# Patient Record
Sex: Male | Born: 1950 | Race: White | Hispanic: No | Marital: Married | Smoking: Former smoker
Health system: Southern US, Community
[De-identification: ages and names within clinical notes are randomized; demographics above are authoritative.]

## PROBLEM LIST (undated history)

## (undated) DIAGNOSIS — S065XAA Traumatic subdural hemorrhage with loss of consciousness status unknown, initial encounter: Secondary | ICD-10-CM

## (undated) DIAGNOSIS — F32A Depression, unspecified: Secondary | ICD-10-CM

## (undated) DIAGNOSIS — I10 Essential (primary) hypertension: Secondary | ICD-10-CM

## (undated) DIAGNOSIS — C649 Malignant neoplasm of unspecified kidney, except renal pelvis: Secondary | ICD-10-CM

## (undated) DIAGNOSIS — F329 Major depressive disorder, single episode, unspecified: Secondary | ICD-10-CM

## (undated) DIAGNOSIS — F419 Anxiety disorder, unspecified: Secondary | ICD-10-CM

## (undated) DIAGNOSIS — E785 Hyperlipidemia, unspecified: Secondary | ICD-10-CM

## (undated) DIAGNOSIS — C61 Malignant neoplasm of prostate: Secondary | ICD-10-CM

## (undated) DIAGNOSIS — J439 Emphysema, unspecified: Secondary | ICD-10-CM

## (undated) DIAGNOSIS — M2550 Pain in unspecified joint: Secondary | ICD-10-CM

## (undated) HISTORY — DX: Pain in unspecified joint: M25.50

## (undated) HISTORY — DX: Emphysema, unspecified: J43.9

## (undated) HISTORY — DX: Essential (primary) hypertension: I10

## (undated) HISTORY — DX: Major depressive disorder, single episode, unspecified: F32.9

## (undated) HISTORY — DX: Depression, unspecified: F32.A

## (undated) HISTORY — DX: Hyperlipidemia, unspecified: E78.5

## (undated) HISTORY — DX: Anxiety disorder, unspecified: F41.9

## (undated) HISTORY — DX: Malignant neoplasm of unspecified kidney, except renal pelvis: C64.9

## (undated) HISTORY — DX: Malignant neoplasm of prostate: C61

---

## 1994-11-28 DIAGNOSIS — J432 Centrilobular emphysema: Secondary | ICD-10-CM | POA: Insufficient documentation

## 1994-11-28 DIAGNOSIS — J439 Emphysema, unspecified: Secondary | ICD-10-CM

## 1994-11-28 HISTORY — DX: Emphysema, unspecified: J43.9

## 2006-11-28 DIAGNOSIS — C61 Malignant neoplasm of prostate: Secondary | ICD-10-CM

## 2006-11-28 HISTORY — DX: Malignant neoplasm of prostate: C61

## 2006-12-29 ENCOUNTER — Ambulatory Visit: Payer: Self-pay | Admitting: Urology

## 2007-01-30 ENCOUNTER — Other Ambulatory Visit: Payer: Self-pay

## 2007-01-30 ENCOUNTER — Ambulatory Visit: Payer: Self-pay | Admitting: Urology

## 2007-02-13 ENCOUNTER — Ambulatory Visit: Payer: Self-pay | Admitting: Urology

## 2008-06-16 ENCOUNTER — Ambulatory Visit: Payer: Self-pay | Admitting: Urology

## 2008-06-19 ENCOUNTER — Other Ambulatory Visit: Payer: Self-pay

## 2008-06-19 ENCOUNTER — Ambulatory Visit: Payer: Self-pay | Admitting: Urology

## 2008-06-24 ENCOUNTER — Ambulatory Visit: Payer: Self-pay | Admitting: Urology

## 2008-07-09 ENCOUNTER — Ambulatory Visit: Payer: Self-pay | Admitting: Urology

## 2008-11-24 ENCOUNTER — Ambulatory Visit: Payer: Self-pay | Admitting: Urology

## 2008-11-28 DIAGNOSIS — C649 Malignant neoplasm of unspecified kidney, except renal pelvis: Secondary | ICD-10-CM

## 2008-11-28 HISTORY — DX: Malignant neoplasm of unspecified kidney, except renal pelvis: C64.9

## 2008-12-09 ENCOUNTER — Ambulatory Visit: Payer: Self-pay | Admitting: Urology

## 2009-06-02 ENCOUNTER — Ambulatory Visit: Payer: Self-pay | Admitting: Urology

## 2010-08-27 ENCOUNTER — Ambulatory Visit: Payer: Self-pay | Admitting: Urology

## 2010-09-07 ENCOUNTER — Ambulatory Visit: Payer: Self-pay | Admitting: Urology

## 2010-09-09 LAB — PATHOLOGY REPORT

## 2010-09-16 ENCOUNTER — Ambulatory Visit: Payer: Self-pay | Admitting: Urology

## 2010-10-28 ENCOUNTER — Ambulatory Visit: Payer: Self-pay | Admitting: Urology

## 2010-11-04 ENCOUNTER — Ambulatory Visit: Payer: Self-pay | Admitting: Urology

## 2010-11-11 ENCOUNTER — Ambulatory Visit: Payer: Self-pay | Admitting: Urology

## 2010-11-18 ENCOUNTER — Ambulatory Visit: Payer: Self-pay | Admitting: Urology

## 2010-12-03 ENCOUNTER — Ambulatory Visit: Payer: Self-pay | Admitting: Urology

## 2010-12-10 ENCOUNTER — Ambulatory Visit: Payer: Self-pay | Admitting: Urology

## 2012-05-22 ENCOUNTER — Ambulatory Visit: Payer: Self-pay | Admitting: Internal Medicine

## 2012-05-28 ENCOUNTER — Ambulatory Visit: Payer: Self-pay | Admitting: Internal Medicine

## 2012-06-28 ENCOUNTER — Ambulatory Visit: Payer: Self-pay | Admitting: Internal Medicine

## 2012-07-29 ENCOUNTER — Ambulatory Visit: Payer: Self-pay | Admitting: Internal Medicine

## 2012-12-12 DIAGNOSIS — Z8551 Personal history of malignant neoplasm of bladder: Secondary | ICD-10-CM | POA: Insufficient documentation

## 2012-12-12 DIAGNOSIS — Z8042 Family history of malignant neoplasm of prostate: Secondary | ICD-10-CM | POA: Insufficient documentation

## 2012-12-12 DIAGNOSIS — N390 Urinary tract infection, site not specified: Secondary | ICD-10-CM | POA: Insufficient documentation

## 2012-12-12 DIAGNOSIS — Z8546 Personal history of malignant neoplasm of prostate: Secondary | ICD-10-CM | POA: Insufficient documentation

## 2012-12-12 DIAGNOSIS — C671 Malignant neoplasm of dome of bladder: Secondary | ICD-10-CM | POA: Insufficient documentation

## 2012-12-12 DIAGNOSIS — R300 Dysuria: Secondary | ICD-10-CM | POA: Insufficient documentation

## 2012-12-12 DIAGNOSIS — R339 Retention of urine, unspecified: Secondary | ICD-10-CM | POA: Insufficient documentation

## 2012-12-12 DIAGNOSIS — C61 Malignant neoplasm of prostate: Secondary | ICD-10-CM | POA: Insufficient documentation

## 2012-12-12 DIAGNOSIS — R350 Frequency of micturition: Secondary | ICD-10-CM | POA: Insufficient documentation

## 2012-12-12 DIAGNOSIS — N529 Male erectile dysfunction, unspecified: Secondary | ICD-10-CM | POA: Insufficient documentation

## 2012-12-12 DIAGNOSIS — D099 Carcinoma in situ, unspecified: Secondary | ICD-10-CM | POA: Insufficient documentation

## 2012-12-12 DIAGNOSIS — R310 Gross hematuria: Secondary | ICD-10-CM | POA: Insufficient documentation

## 2012-12-19 DIAGNOSIS — Z8546 Personal history of malignant neoplasm of prostate: Secondary | ICD-10-CM | POA: Insufficient documentation

## 2013-06-19 DIAGNOSIS — N139 Obstructive and reflux uropathy, unspecified: Secondary | ICD-10-CM | POA: Insufficient documentation

## 2014-11-28 HISTORY — PX: DIGIT NAIL REMOVAL: SHX5052

## 2015-02-16 ENCOUNTER — Other Ambulatory Visit: Payer: Self-pay

## 2015-02-16 VITALS — BP 112/58 | Ht 70.5 in | Wt 225.4 lb

## 2015-02-16 DIAGNOSIS — R739 Hyperglycemia, unspecified: Secondary | ICD-10-CM | POA: Insufficient documentation

## 2015-02-16 NOTE — Assessment & Plan Note (Signed)
Schedule an appointment with foot doctor within the next two weeks

## 2015-02-16 NOTE — Patient Instructions (Signed)
  Problem- discharge from under the toenail Goal- prevent long term complications by scheduling an appointment with foot doctor within the next two weeks.    Please complete the EMMI education I have sent you by e-mail

## 2015-02-16 NOTE — Patient Outreach (Signed)
Henry Horne came to me today feeling very unwell.  He complained of cold symptoms and tells me he had a fever last week.  He saw Dr. Luan Pulling last Thursday, March 17th, 2016 but was prescribed no medications. He has put a call in this am for Dr. Luan Pulling because he continues to feel badly.    He tells me he developed shingles about 2 weeks ago and was put on Acyclovir- he has completed the prescription and the pain has subsided. Blood sugars have been remarkably higher since 01/29/15.    Henry Horne was tracking his food intake through the Henry Horne when he was feeling better but has not been doing Horne lately- his diet reveals highly sweetened/ high fat choices- Poptarts, biscuits, Henry Horne.   Henry Horne inquired about a foot doctor and tells me he wants some toenails removed.  He also reveals that he pressed on one nail on the left foot and some discharge was released- he denies exudate from the nails on a daily basis and says there are no open areas, no flaking, no discoloration.

## 2015-02-18 ENCOUNTER — Other Ambulatory Visit: Payer: Self-pay

## 2015-02-18 NOTE — Patient Outreach (Signed)
   02/18/2015   Henry Horne 1951-02-12 [id number]  I called the patient to check on him as a follow up to our visit on Monday, February 16, 2015.  He tells me the MD ordered him a Z-pak and Guifenesin-codeine for his cough.  The patient has not called the foot doctor yet and has not noticed any further discharge.  I will follow up with a phone call in a few weeks.     Gentry Fitz, RN, BA, [place], [place] Direct Dial:  [phone]  Fax:  [phone] E-mail: [email or web address] [address], [place], [place]  [address]

## 2015-03-10 ENCOUNTER — Other Ambulatory Visit: Payer: Self-pay

## 2015-03-10 NOTE — Patient Outreach (Signed)
Spoke with Henry Horne today by phone as he left me a message regarding seeing the podiatrist last week.  By the time he had seen the podiatrist, there was no more drainage coming from below his toenail- he had been soaking it and the drainiage had subsided.  MD assessed his toes and are planning to remove 2  nails on Thursday, March 13, 2015. Henry Horne is not having any pain and was at work when I spoke to him and was not started on antibiotics.   Gentry Fitz, RN, BA, MHA, CDE Diabetes Coordinator Inpatient Diabetes Program  [phone] (Team Pager) [phone] [name]) 03/10/2015 1:10 PM

## 2015-03-24 ENCOUNTER — Other Ambulatory Visit: Payer: Self-pay

## 2015-06-16 ENCOUNTER — Other Ambulatory Visit: Payer: Self-pay

## 2015-06-16 VITALS — BP 118/70 | Ht 70.5 in | Wt 226.2 lb

## 2015-06-16 DIAGNOSIS — R739 Hyperglycemia, unspecified: Secondary | ICD-10-CM

## 2015-06-16 NOTE — Patient Outreach (Signed)
[place] [facility]) Care Management  [place]  06/16/2015   Henry Horne 1951-04-26 [id number]  Subjective: Patient comes to me for a regular scheduled visit.  He has been to the foot doctor a number of times since our last visit and had toenails from both feet removed after he noticed discharge from under some of them.   The toes look healthy, there is currently no drainage-all are dry.  His left big toe sporadically gives him pain.  It is a little more red than the other toes but has no open areas- it was the most recent toe examined and surgically manipulated.  He is checking his feet daily.  His blood sugar were somewhat more elevated during that time but are generally well controlled now.    He is having a great deal of financial stress related to his children and grandchildren.    He had shingles in the early part of this year- he complains of still having some marks across his back and is looking forward to getting the Shingles vaccine.   He saw Dr. Jacqlyn Larsen in May at which time he had a cystoscope- he will see him again in 6 months.   Objective:   Filed Vitals:   06/16/15 1438  BP: 118/70  Weight 226.2    Current Medications:  Current Outpatient Prescriptions  Medication Sig Dispense Refill  . albuterol (PROAIR HFA) 108 (90 BASE) MCG/ACT inhaler as needed.    Marland Kitchen amLODipine (NORVASC) 2.5 MG tablet     . benazepril (LOTENSIN) 40 MG tablet Take 40 mg by mouth.    . esomeprazole (NEXIUM) 40 MG capsule Take 40 mg by mouth every other day.    Marland Kitchen FLUoxetine (PROZAC) 20 MG capsule every other day.    . Fluticasone-Salmeterol (ADVAIR DISKUS) 250-50 MCG/DOSE AEPB as needed.    . Garlic Oil 2 MG CAPS Take 1,000 mg by mouth 2 times daily at 12 noon and 4 pm.    . HYDROcodone-acetaminophen (NORCO/VICODIN) 5-325 MG per tablet as needed.    Marland Kitchen ibuprofen (ADVIL,MOTRIN) 800 MG tablet 1 tablet as needed.    Marland Kitchen imipramine (TOFRANIL) 25 MG tablet Take 25 mg by mouth daily.     . insulin glargine (LANTUS) 100 UNIT/ML injection 56 Units every evening.    . Insulin Pen Needle (NOVOFINE) 30G X 8 MM MISC     . Insulin Pen Needle (UNIFINE PENTIPS) 31G X 5 MM MISC     . Liraglutide (VICTOZA) 18 MG/3ML SOPN Frequency:PHARMDIR   Dosage:0.0     Instructions:  Note:1.49m SQ daily Dose: 0.6MG/0.1ML    . metFORMIN (GLUCOPHAGE) 1000 MG tablet Take 1,000 mg by mouth.    .Marland KitchenamLODipine (NORVASC) 2.5 MG tablet Take 2.5 mg by mouth daily.    . benazepril (LOTENSIN) 40 MG tablet Take 40 mg by mouth daily. 1 tablet once a day    . esomeprazole (NEXIUM) 40 MG capsule Take 40 mg by mouth daily at 12 noon. 1 tablet once a day    . FLUoxetine (PROZAC) 20 MG tablet Take 20 mg by mouth daily.    . Fluticasone-Salmeterol (ADVAIR) 250-50 MCG/DOSE AEPB Inhale 1 puff into the lungs once. Once a day    . imipramine (TOFRANIL) 25 MG tablet Take 25 mg by mouth at bedtime.    . insulin glargine (LANTUS) 100 UNIT/ML injection Inject 56 Units into the skin daily. Solostar pen    . metFORMIN (GLUCOPHAGE) 1000 MG tablet Take 1,000 mg by  mouth 2 (two) times daily.    . vitamin C (ASCORBIC ACID) 500 MG tablet Take 1,000 mg by mouth daily.     No current facility-administered medications for this visit.    Functional Status:  In your present state of health, do you have any difficulty performing the following activities: 06/16/2015  Hearing? N  Vision? N  Difficulty concentrating or making decisions? N  Walking or climbing stairs? N  Dressing or bathing? N  Doing errands, shopping? N    Fall/Depression Screening: PHQ 2/9 Scores 06/16/2015  PHQ - 2 Score 2  PHQ- 9 Score 8    Assessment: Appears to be managing his blood sugars.  No complaints. Taking his meds as ordered.  Tends to skip meals and eat foods that are easy to chose. Still wants to lose a little more weight.   Plan:  [place] Problem One        Patient Outreach from 06/16/2015 in [place] Problem  One  patient is having discharge from under his toe nail on his foot   Care Plan for Problem One  Not Active   [facility] Long Term Goal (31-90 days)  Patient will contact the foot doctor to schedule an appointment by March 02, 2015 (two weeks)   [facility] Long Term Goal Start Date  02/16/15   [facility] Long Term Goal Met Date  06/16/15   Interventions for Problem One Long Term Goal  call foot doctor at [place] Problem Two        Patient Outreach from 06/16/2015 in [place] Problem Two  A1C currently 8.0%   Care Plan for Problem Two  Active   Interventions for Problem Two Long Term Goal   Limit sweets to 2x/week, limit fried food to once a day, eat 3 times a day and combine protein and carb   [facility] Long Term Goal (31-90) days  A1C less than 8.%   [facility] Long Term Goal Start Date  06/16/15      Follow up with patient in 3 months.   Gentry Fitz, RN, BA, [place], [place] Direct Dial:  [phone]  Fax:  [phone] E-mail: [email or web address] [address] [place], [place]  [address]

## 2015-07-29 ENCOUNTER — Encounter: Payer: Self-pay | Admitting: Family Medicine

## 2015-07-29 ENCOUNTER — Ambulatory Visit (INDEPENDENT_AMBULATORY_CARE_PROVIDER_SITE_OTHER): Payer: 59 | Admitting: Family Medicine

## 2015-07-29 VITALS — BP 117/73 | HR 78 | Temp 98.7°F | Resp 16 | Ht 70.5 in | Wt 230.2 lb

## 2015-07-29 DIAGNOSIS — E1142 Type 2 diabetes mellitus with diabetic polyneuropathy: Secondary | ICD-10-CM | POA: Diagnosis not present

## 2015-07-29 DIAGNOSIS — D090 Carcinoma in situ of bladder: Secondary | ICD-10-CM

## 2015-07-29 DIAGNOSIS — I10 Essential (primary) hypertension: Secondary | ICD-10-CM | POA: Insufficient documentation

## 2015-07-29 LAB — POCT GLYCOSYLATED HEMOGLOBIN (HGB A1C): Hemoglobin A1C: 7.69

## 2015-07-29 MED ORDER — INSULIN GLARGINE 100 UNIT/ML ~~LOC~~ SOLN: 62.0000 [IU] | Freq: Every day | SUBCUTANEOUS | Status: DC

## 2015-07-29 NOTE — Progress Notes (Signed)
Name: Henry Horne   MRN: [id number]    DOB: 1951/05/14   Date:07/29/2015       Progress Note  Subjective  Chief Complaint  Chief Complaint  Patient presents with  . Diabetes    A1C 8.0  04/23/2015 BS checked once every three weeks  218 this AM    HPI  For f/u of DM and HBP.  Has bladder cancer and is followed by Dr. Jacqlyn Horne.  He does not check sugars as he should. (About once every 2-3 weeks).  Has not kept Neurology f/u re: neuropathy of legs/feet.  No problem-specific assessment & plan notes found for this encounter.   Past Medical History  Diagnosis Date  . Anxiety   . Depression   . Hypertension   . Hyperlipidemia   . Joint pain   . Emphysema of lung 1996  . Prostate cancer 2008    removed with cryotherapy  . Cancer of kidney 2010    cystoscopy 2010, 2014, 2015, 2016- MD q 6 months    Social History  Substance Use Topics  . Smoking status: Former Smoker -- 30 years    Quit date: 05/18/2014  . Smokeless tobacco: Never Used  . Alcohol Use: No     Current outpatient prescriptions:  .  albuterol (PROAIR HFA) 108 (90 BASE) MCG/ACT inhaler, as needed., Disp: , Rfl:  .  amLODipine (NORVASC) 2.5 MG tablet, , Disp: , Rfl:  .  benazepril (LOTENSIN) 40 MG tablet, Take 40 mg by mouth daily. 1 tablet once a day, Disp: , Rfl:  .  esomeprazole (NEXIUM) 40 MG capsule, Take 40 mg by mouth every other day., Disp: , Rfl:  .  FLUoxetine (PROZAC) 20 MG tablet, Take 20 mg by mouth daily., Disp: , Rfl:  .  Fluticasone-Salmeterol (ADVAIR DISKUS) 250-50 MCG/DOSE AEPB, as needed., Disp: , Rfl:  .  Garlic Oil 2 MG CAPS, Take 1,000 mg by mouth 2 times daily at 12 noon and 4 pm., Disp: , Rfl:  .  HYDROcodone-acetaminophen (NORCO/VICODIN) 5-325 MG per tablet, as needed., Disp: , Rfl:  .  ibuprofen (ADVIL,MOTRIN) 800 MG tablet, 1 tablet as needed., Disp: , Rfl:  .  imipramine (TOFRANIL) 25 MG tablet, Take 25 mg by mouth daily., Disp: , Rfl:  .  insulin glargine (LANTUS) 100 UNIT/ML  injection, 56 Units every evening., Disp: , Rfl:  .  Insulin Pen Needle (NOVOFINE) 30G X 8 MM MISC, , Disp: , Rfl:  .  Insulin Pen Needle (UNIFINE PENTIPS) 31G X 5 MM MISC, , Disp: , Rfl:  .  Liraglutide (VICTOZA) 18 MG/3ML SOPN, Frequency:PHARMDIR   Dosage:0.0     Instructions:  Note:1.3mL SQ daily Dose: 0.6MG /0.1ML, Disp: , Rfl:  .  metFORMIN (GLUCOPHAGE) 1000 MG tablet, Take 1,000 mg by mouth., Disp: , Rfl:   Allergies  Allergen Reactions  . Pneumococcal Vaccine   . Pneumococcal Vaccines     Review of Systems  Constitutional: Negative for fever, chills, weight loss and malaise/fatigue.  HENT: Negative for hearing loss.   Eyes: Negative for blurred vision and double vision.  Respiratory: Negative for cough, sputum production, shortness of breath and wheezing.   Cardiovascular: Negative for chest pain, palpitations, orthopnea and leg swelling.  Gastrointestinal: Negative for heartburn, nausea, vomiting, abdominal pain and diarrhea.  Genitourinary: Negative for dysuria and frequency.  Neurological: Positive for tingling. Negative for weakness and headaches.       Numbness of both plantar surfaes of feet.      Objective  Filed Vitals:   07/29/15 0922  BP: 117/73  Pulse: 78  Temp: 98.7 F (37.1 C)  Resp: 16  Height: 5' 10.5" (1.791 m)  Weight: 230 lb 3.2 oz (104.418 kg)     Physical Exam  Constitutional: He is well-developed, well-nourished, and in no distress. No distress.  HENT:  Head: Normocephalic and atraumatic.  Eyes: Conjunctivae and EOM are normal. Pupils are equal, round, and reactive to light. No scleral icterus.  Neck: Normal range of motion. Neck supple. Carotid bruit is not present. No thyromegaly present.  Cardiovascular: Normal rate, regular rhythm, normal heart sounds and intact distal pulses.  Exam reveals no gallop and no friction rub.   No murmur heard. Pulmonary/Chest: Effort normal and breath sounds normal. No respiratory distress. He has no wheezes.  He has no rales.  Abdominal: Soft. Bowel sounds are normal. He exhibits no distension and no mass. There is no tenderness.  Musculoskeletal: He exhibits no edema.  Lymphadenopathy:    He has no cervical adenopathy.  Neurological:  Some decreased sensation on plantar surface of both feet.  Vitals reviewed.     Recent Results (from the past 2160 hour(s))  POCT HgB A1C     Status: Abnormal   Collection Time: 07/29/15  9:42 AM  Result Value Ref Range   Hemoglobin A1C 7.69      Assessment & Plan  1. Type 2 diabetes mellitus with diabetic polyneuropathy  - POCT HgB A1C - 7.9 - insulin glargine (LANTUS) 100 UNIT/ML injection; Inject 0.62 mLs (62 Units total) into the skin at bedtime.  Dispense: 10 mL; Refill: 12  2. Bladder CA in situ   3. Essential hypertension

## 2015-07-29 NOTE — Patient Outreach (Signed)
[facility]) Care Management  07/29/2015  Henry Horne September 27, 1951 [id number]   Raden stopped by to tell me his A1C is 7.9% and that his MD had increased his Lantus insulin from 56units/day to 62 units per day.   Gentry Fitz, RN, BA, [place], [place] Direct Dial:  [phone]  Fax:  [phone] E-mail: [email or web address] [address], [place], [place]  [address]

## 2015-07-29 NOTE — Patient Instructions (Signed)
Encouraged to get yearly eye exam at [facility] soon.  Check blood sugars at least once a day.

## 2015-09-14 ENCOUNTER — Ambulatory Visit: Payer: 59

## 2015-09-16 ENCOUNTER — Other Ambulatory Visit: Payer: Self-pay

## 2015-09-16 ENCOUNTER — Other Ambulatory Visit: Payer: Self-pay | Admitting: Family Medicine

## 2015-09-16 VITALS — BP 126/64 | Ht 70.5 in | Wt 229.9 lb

## 2015-09-16 DIAGNOSIS — E1165 Type 2 diabetes mellitus with hyperglycemia: Secondary | ICD-10-CM

## 2015-09-16 DIAGNOSIS — Z794 Long term (current) use of insulin: Secondary | ICD-10-CM

## 2015-09-16 DIAGNOSIS — R739 Hyperglycemia, unspecified: Secondary | ICD-10-CM

## 2015-09-16 NOTE — Patient Outreach (Signed)
[facility] [facility]) Care Management  [place]  09/16/2015   Henry Horne 18-Oct-1951 [id number]  Subjective: Patient comes to me for his Link to Wellness visit. He complains of elevated blood sugars 90-240m/dl.  He also reveals he's having more financial difficulties and has decided to decrease his Prozac from once a day to once every two days because he was having "terrible dreams".   Objective:  Filed Vitals:   09/16/15 1053  BP: 126/64  Height: 1.791 m (5' 10.5")  Weight: 229 lb 14.4 oz (104.282 kg)     Current Medications:  Current Outpatient Prescriptions  Medication Sig Dispense Refill  . albuterol (PROAIR HFA) 108 (90 BASE) MCG/ACT inhaler as needed.    .Marland KitchenamLODipine (NORVASC) 2.5 MG tablet     . benazepril (LOTENSIN) 40 MG tablet Take 40 mg by mouth daily. 1 tablet once a day    . esomeprazole (NEXIUM) 40 MG capsule Take 40 mg by mouth every other day.    .Marland KitchenFLUoxetine (PROZAC) 20 MG tablet Take 20 mg by mouth every other day.     . Fluticasone-Salmeterol (ADVAIR DISKUS) 250-50 MCG/DOSE AEPB as needed.    . Garlic Oil 2 MG CAPS Take 1,000 mg by mouth daily.     .Marland KitchenHYDROcodone-acetaminophen (NORCO/VICODIN) 5-325 MG per tablet as needed.    .Marland Kitchenibuprofen (ADVIL,MOTRIN) 800 MG tablet 1 tablet as needed.    .Marland Kitchenimipramine (TOFRANIL) 25 MG tablet Take 25 mg by mouth daily.    . insulin glargine (LANTUS) 100 UNIT/ML injection Inject 0.62 mLs (62 Units total) into the skin at bedtime. 10 mL 12  . Insulin Pen Needle (NOVOFINE) 30G X 8 MM MISC     . Insulin Pen Needle (UNIFINE PENTIPS) 31G X 5 MM MISC     . Liraglutide (VICTOZA) 18 MG/3ML SOPN Frequency:PHARMDIR   Dosage:0.0     Instructions:  Note:1.863mSQ daily Dose: 0.6MG/0.1ML    . metFORMIN (GLUCOPHAGE) 1000 MG tablet Take 1,000 mg by mouth 2 (two) times daily with a meal.     . TRUEPLUS LANCETS 30G MISC USE AS DIRECTED 3 TIMES A DAY 300 each 1  . TRUETEST TEST test strip USE AS DIRECTED 3 TIMES A DAY 300  each 1   No current facility-administered medications for this visit.    Functional Status:  In your present state of health, do you have any difficulty performing the following activities: 09/16/2015 07/29/2015  Hearing? N N  Vision? N N  Difficulty concentrating or making decisions? N N  Walking or climbing stairs? N N  Dressing or bathing? N N  Doing errands, shopping? N N    Fall/Depression Screening: PHQ 2/9 Scores 09/16/2015 07/29/2015 06/16/2015  PHQ - 2 Score 4 0 2  PHQ- 9 Score 7 - 8    Assessment: Poorly controlled and poorly engaged patient.  He has some elevated blood sugars but he's only been checking the past week or so just before coming to this appointment.  He has not changed how he's eating- makes poor choices and goes long periods without eating.  Drinking diet drinks only but is eating sweets.   Plan: The only real change Dewarren has made in his recent care, is his decrease in Prozac that he made.  He was having such terrible dreams that he chose to decrease the Prozac to every other day. Historically, when Shawn was not on his depression medication, his blood sugars were worse.  I have suggested he increase  his Prozac to taking 2 days in a row, skipping one day and that I would share this information with you. He may need to be reassessed for a more appropriate anti-depression medication.   [facility] CM Care Plan Problem One        Most Recent Value   Role Documenting the Problem One  Care Management Coordinator   Care Plan for Problem One  Not Active   [facility] Long Term Goal (31-90 days)  Patient will contact the foot doctor to schedule an appointment by March 02, 2015 (two weeks)   [facility] Long Term Goal Start Date  02/16/15   [facility] Long Term Goal Met Date  06/16/15   Interventions for Problem One Long Term Goal  call foot doctor at [phone]    [facility] CM Care Plan Problem Two        Most Recent Value   Care Plan Problem Two  A1C currently 8.0%   Role Documenting the Problem Two   Care Management Coordinator   Care Plan for Problem Two  Active   Interventions for Problem Two Long Term Goal   Limit sweets to 2x/week, limit fried food to once a day, eat 3 times a day and combine protein and carb   [facility] Long Term Goal (31-90) days  A1C less than 8.%   [facility] Long Term Goal Start Date  06/16/15   [facility] Long Term Goal Met Date  09/16/15    [facility] CM Care Plan Problem Three        Most Recent Value   Care Plan Problem Three  Patient is having high blood sugars   Role Documenting the Problem Three  Care Management Coordinator   Care Plan for Problem Three  Active   [facility] Long Term Goal (31-90) days  Patient will lower average blood sugars next time I meet with him in December, 2016   [place] Term Goal Start Date  09/16/15   Interventions for Problem Three Long Term Goal  Check blood sugar once a day, increase Prozac- try take it for 2 days and then off one day     Follow up with me on 11/17/15 and MD on 11/02/15   Gentry Fitz, RN, [place], [place], [place]:  [phone]  Fax:  [phone] E-mail: [email or web address] [address], [place], [place]  [address]

## 2015-10-08 DIAGNOSIS — R8271 Bacteriuria: Secondary | ICD-10-CM | POA: Insufficient documentation

## 2015-10-09 LAB — HM DIABETES EYE EXAM

## 2015-10-14 ENCOUNTER — Encounter: Payer: Self-pay | Admitting: Family Medicine

## 2015-11-02 ENCOUNTER — Encounter: Payer: Self-pay | Admitting: Family Medicine

## 2015-11-02 ENCOUNTER — Telehealth: Payer: Self-pay

## 2015-11-02 ENCOUNTER — Ambulatory Visit (INDEPENDENT_AMBULATORY_CARE_PROVIDER_SITE_OTHER): Payer: 59 | Admitting: Family Medicine

## 2015-11-02 VITALS — BP 146/82 | HR 80

## 2015-11-02 VITALS — BP 146/82 | HR 80 | Temp 97.9°F | Resp 16 | Ht 70.5 in | Wt 241.6 lb

## 2015-11-02 DIAGNOSIS — Z23 Encounter for immunization: Secondary | ICD-10-CM | POA: Diagnosis not present

## 2015-11-02 DIAGNOSIS — E1140 Type 2 diabetes mellitus with diabetic neuropathy, unspecified: Secondary | ICD-10-CM

## 2015-11-02 DIAGNOSIS — C679 Malignant neoplasm of bladder, unspecified: Secondary | ICD-10-CM

## 2015-11-02 DIAGNOSIS — I10 Essential (primary) hypertension: Secondary | ICD-10-CM | POA: Diagnosis not present

## 2015-11-02 DIAGNOSIS — E119 Type 2 diabetes mellitus without complications: Secondary | ICD-10-CM

## 2015-11-02 DIAGNOSIS — E785 Hyperlipidemia, unspecified: Secondary | ICD-10-CM

## 2015-11-02 DIAGNOSIS — E1169 Type 2 diabetes mellitus with other specified complication: Secondary | ICD-10-CM | POA: Insufficient documentation

## 2015-11-02 DIAGNOSIS — Z794 Long term (current) use of insulin: Secondary | ICD-10-CM | POA: Diagnosis not present

## 2015-11-02 LAB — POCT GLYCOSYLATED HEMOGLOBIN (HGB A1C): HEMOGLOBIN A1C: 7.4

## 2015-11-02 MED ORDER — GLIMEPIRIDE 1 MG PO TABS: 1.0000 mg | ORAL_TABLET | Freq: Every day | ORAL | Status: DC

## 2015-11-02 MED ORDER — HYDROCODONE-ACETAMINOPHEN 5-325 MG PO TABS: 1.0000 | ORAL_TABLET | ORAL | Status: DC | PRN

## 2015-11-02 MED ORDER — AMLODIPINE BESYLATE 5 MG PO TABS: 5.0000 mg | ORAL_TABLET | Freq: Every day | ORAL | Status: DC

## 2015-11-02 NOTE — Progress Notes (Signed)
Subjective:    Patient ID: Henry Horne, male    DOB: 04-Aug-1951, 64 y.o.   MRN: [id number]  HPI: Henry Horne is a 64 y.o. male presenting on 11/02/2015 for Diabetes; Hypertension; and Hyperlipidemia   Diabetes Pertinent negatives for hypoglycemia include no headaches. Associated symptoms include foot paresthesias (describes bottom of feet as feeling like shrunk leather. ). Pertinent negatives for diabetes include no chest pain, no polydipsia, no polyphagia and no polyuria.  Hypertension Pertinent negatives include no chest pain, headaches or shortness of breath.  Hyperlipidemia Pertinent negatives include no chest pain, myalgias or shortness of breath.    Diabetes He presents for his follow-up diabetic visit. He has type 2 diabetes mellitus. Hypoglycemia symptoms include nervousness/anxiousness and sweats. Pertinent negatives for hypoglycemia include no dizziness or headaches. Pertinent negatives for diabetes include no blurred vision and no chest pain. There are no hypoglycemic complications. He participates in exercise intermittently. His overall blood glucose range is 110-130 mg/dl. An ACE inhibitor/angiotensin II receptor blocker is being taken. He sees a podiatrist.Eye exam is current.  Hypertension This is a chronic problem. Associated symptoms include sweats. Pertinent negatives include no blurred vision, chest pain, headaches, malaise/fatigue, palpitations or peripheral edema. Risk factors for coronary artery disease include diabetes mellitus and dyslipidemia. Past treatments include ACE inhibitors and calcium channel blockers.   Pt presents for diabetes follow-up. Last A1c was 7.6%. Pt has been taking 1/2 glimpiride in the AM (total 1mg ). No low blood sugars. He is taking metformin BID 1000mg  and 62 lantus, victoza daily. Pt works 1-10pm. Takes medication 12pm/ 12am daily. Eats when he takes it.  Eye exam done November 2016- no retinopathy  Being followed by urology  for bladder cancer.   Past Medical History  Diagnosis Date  . Anxiety   . Depression   . Hypertension   . Hyperlipidemia   . Joint pain   . Emphysema of lung ([place]) 1996  . Prostate cancer ([place]) 2008    removed with cryotherapy  . Cancer of kidney ([place]) 2010    cystoscopy 2010, 2014, 2015, 2016- MD q 6 months    Current Outpatient Prescriptions on File Prior to Visit  Medication Sig  . albuterol (PROAIR HFA) 108 (90 BASE) MCG/ACT inhaler as needed.  Marland Kitchen amLODipine (NORVASC) 5 MG tablet Take 1 tablet (5 mg total) by mouth daily.  . benazepril (LOTENSIN) 40 MG tablet Take 40 mg by mouth daily. 1 tablet once a day  . esomeprazole (NEXIUM) 40 MG capsule Take 40 mg by mouth every other day.  Marland Kitchen FLUoxetine (PROZAC) 20 MG tablet Take 20 mg by mouth every other day.   . Fluticasone-Salmeterol (ADVAIR DISKUS) 250-50 MCG/DOSE AEPB as needed.  . Garlic Oil 2 MG CAPS Take 1,000 mg by mouth daily.   Marland Kitchen HYDROcodone-acetaminophen (NORCO/VICODIN) 5-325 MG per tablet as needed.  Marland Kitchen ibuprofen (ADVIL,MOTRIN) 800 MG tablet 1 tablet as needed.  Marland Kitchen imipramine (TOFRANIL) 25 MG tablet Take 25 mg by mouth daily.  . insulin glargine (LANTUS) 100 UNIT/ML injection Inject 0.62 mLs (62 Units total) into the skin at bedtime.  . Insulin Pen Needle (NOVOFINE) 30G X 8 MM MISC   . Insulin Pen Needle (UNIFINE PENTIPS) 31G X 5 MM MISC   . Liraglutide (VICTOZA) 18 MG/3ML SOPN Frequency:PHARMDIR   Dosage:0.0     Instructions:  Note:1.54mL SQ daily Dose: 0.6MG /0.1ML  . metFORMIN (GLUCOPHAGE) 1000 MG tablet Take 1,000 mg by mouth 2 (two) times daily with a meal.   .  TRUEPLUS LANCETS 30G MISC USE AS DIRECTED 3 TIMES A DAY  . TRUETEST TEST test strip USE AS DIRECTED 3 TIMES A DAY   No current facility-administered medications on file prior to visit.    Review of Systems  Constitutional: Negative for fever and chills.  Respiratory: Negative for chest tightness, shortness of breath and wheezing.   Cardiovascular: Negative for  chest pain.  Gastrointestinal: Negative.   Endocrine: Negative for cold intolerance, heat intolerance, polydipsia, polyphagia and polyuria.  Musculoskeletal: Negative for myalgias.  Neurological: Negative for light-headedness, numbness and headaches.  Psychiatric/Behavioral: Negative.    Per HPI unless specifically indicated above     Objective:     Filed Vitals:   11/02/15 1155  BP: 146/82  Pulse: 80    Wt Readings from Last 3 Encounters:  11/02/15 241 lb 9.6 oz (109.589 kg)  09/16/15 229 lb 14.4 oz (104.282 kg)  07/29/15 230 lb 3.2 oz (104.418 kg)   Temp Readings from Last 3 Encounters:  11/02/15 97.9 F (36.6 C) Oral  07/29/15 98.7 F (37.1 C)    BP Readings from Last 3 Encounters:  11/02/15 146/82  09/16/15 126/64  07/29/15 117/73   Pulse Readings from Last 3 Encounters:  11/02/15 80  07/29/15 78  146.8  Wt Readings from Last 3 Encounters:  11/02/15 241 lb 9.6 oz (109.589 kg)  09/16/15 229 lb 14.4 oz (104.282 kg)  07/29/15 230 lb 3.2 oz (104.418 kg)    Physical Exam  Constitutional: He is oriented to person, place, and time. He appears well-developed and well-nourished. No distress.  Neck: Normal range of motion. Neck supple. No thyromegaly present.  Cardiovascular: Normal rate and regular rhythm.  Exam reveals no gallop and no friction rub.   No murmur heard. Pulmonary/Chest: Effort normal and breath sounds normal.  Abdominal: Soft. Bowel sounds are normal. There is no tenderness. There is no rebound.  Musculoskeletal: Normal range of motion. He exhibits no edema or tenderness.  Lymphadenopathy:    He has no cervical adenopathy.  Neurological: He is alert and oriented to person, place, and time.  Skin: Skin is warm and dry. He is not diaphoretic.   Diabetic Foot Exam - Simple   Simple Foot Form  Diabetic Foot exam was performed with the following findings:  Yes 11/02/2015  8:45 AM  Visual Inspection  No deformities, no ulcerations, no other skin  breakdown bilaterally:  Yes  Sensation Testing  Intact to touch and monofilament testing bilaterally:  Yes  Pulse Check  Posterior Tibialis and Dorsalis pulse intact bilaterally:  Yes  Comments      Results for orders placed or performed in visit on 11/02/15  POCT HgB A1C  Result Value Ref Range   Hemoglobin A1C 7.4       Assessment & Plan:   Problem List Items Addressed This Visit      Cardiovascular and Mediastinum   HBP (high blood pressure)    Increase amlodipine to 5mg  daily. Check CMP. ACE for renal protection. RTC 1 mos.         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 Have Discussed limiting to 3 agents. Pt wants to try to continue with amaryl. Hypoglycemia protocol discussed. ACE for renal protection.  CMP and lipid.  3 mos for A1C check. Microalbumin needed. Eye exam UTD. Foot exam done.       Relevant Medications   glimepiride (AMARYL) 1 MG tablet  Genitourinary   Bladder cancer [facility])    Managed by [facility] neurology. Followed up with Dr. Jacqlyn Larsen 2 weeks ago.         Other   Hyperlipidemia associated with type 2 diabetes mellitus ([place])    Check lipid panel.  Pt may be candidate for statin due to DM.  Will discuss pending lab results.       Relevant Medications   glimepiride (AMARYL) 1 MG tablet    Other Visit Diagnoses    Need for influenza vaccination        Type 2 diabetes mellitus without complication, unspecified long term insulin use status ([place])        Relevant Medications    glimepiride (AMARYL) 1 MG tablet       Meds ordered this encounter  Medications  . glimepiride (AMARYL) 1 MG tablet    Sig: Take 1 tablet (1 mg total) by mouth daily with breakfast.    Dispense:  90 tablet    Refill:  3    Order Specific Question:  Supervising Provider    Answer:  Arlis Porta [id number]      Follow up plan: No Follow-up on file.

## 2015-11-02 NOTE — Assessment & Plan Note (Signed)
Increase amlodipine to 5mg  daily. Check CMP. ACE for renal protection. RTC 1 mos.

## 2015-11-02 NOTE — Telephone Encounter (Signed)
We had vicodin on file for him. I wrote a prescription for 30 pills with no refills. Please let him know he can pick it up when it's convenient.  Check [place] CSRS no refills in past 6 mos.

## 2015-11-02 NOTE — Telephone Encounter (Signed)
Pt notified and will pick it up tomorrow on 11/03/15

## 2015-11-02 NOTE — Telephone Encounter (Signed)
Patient forgot to ask for refill on Oxycodone. Advised I will call him after it is printed and given to me. Please let me know if you can fill this for him.

## 2015-11-02 NOTE — Addendum Note (Signed)
Addended by: Larene Beach on: 11/02/2015 03:21 PM   Modules accepted: Miquel Dunn

## 2015-11-02 NOTE — Patient Instructions (Signed)
Please check your blood glucose 2-3 times daily. If your glucose is < 70 mg/dl or you have symptoms of hypoglycemia dizziness, headache, hunger, jitteriness and sweating please drink 4 oz of juice or soda.  Check blood glucose 15 minutes later. If it has not risen to >100, please seek medical attention. If > 100 please eat a snack containing protein such as peanut butter and crackers.  Your goal blood pressure is 140/90. Work on low salt/sodium diet - goal <1.5gm (1,500mg ) per day. Eat a diet high in fruits/vegetables and whole grains.  Look into mediterranean and DASH diet. Goal activity is 131min/wk of moderate intensity exercise.  This can be split into 30 minute chunks.  If you are not at this level, you can start with smaller 10-15 min increments and slowly build up activity. Look at [place].org for more resources

## 2015-11-02 NOTE — Progress Notes (Signed)
Subjective:    Patient ID: Henry Horne, male    DOB: 30-Jun-1951, 64 y.o.   MRN: [id number]  HPI: Henry Horne is a 64 y.o. male presenting on 11/02/2015 for Diabetes   Diabetes He presents for his follow-up diabetic visit. He has type 2 diabetes mellitus. Hypoglycemia symptoms include nervousness/anxiousness and sweats. Pertinent negatives for hypoglycemia include no dizziness or headaches. Pertinent negatives for diabetes include no blurred vision and no chest pain. There are no hypoglycemic complications. He participates in exercise intermittently. His overall blood glucose range is 110-130 mg/dl. An ACE inhibitor/angiotensin II receptor blocker is being taken. He sees a podiatrist.Eye exam is current.  Hypertension This is a chronic problem. Associated symptoms include sweats. Pertinent negatives include no blurred vision, chest pain, headaches, malaise/fatigue, palpitations or peripheral edema. Risk factors for coronary artery disease include diabetes mellitus and dyslipidemia. Past treatments include ACE inhibitors and calcium channel blockers.    Pt presents for diabetes follow-up. Last A1c was 7.6%.  Pt has been taking 1/2 glimpiride in the AM (total 1mg ). No low blood sugars.   He is taking metformin BID 1000mg  and 62 lantus,  victoza daily. Pt works 1-10pm.  Takes medication 12pm/ 12am daily.  Eats when he takes it.  Eye exam done November 2016- no retinopathy  Being followed by urology for bladder cancer.      Lab Results  Component Value Date   HGBA1C 7.4 11/02/2015     Past Medical History  Diagnosis Date  . Anxiety   . Depression   . Hypertension   . Hyperlipidemia   . Joint pain   . Emphysema of lung ([place]) 1996  . Prostate cancer ([place]) 2008    removed with cryotherapy  . Cancer of kidney ([place]) 2010    cystoscopy 2010, 2014, 2015, 2016- MD q 6 months    Current Outpatient Prescriptions on File Prior to Visit  Medication Sig  . albuterol (PROAIR HFA)  108 (90 BASE) MCG/ACT inhaler as needed.  Marland Kitchen amLODipine (NORVASC) 2.5 MG tablet   . benazepril (LOTENSIN) 40 MG tablet Take 40 mg by mouth daily. 1 tablet once a day  . esomeprazole (NEXIUM) 40 MG capsule Take 40 mg by mouth every other day.  Marland Kitchen FLUoxetine (PROZAC) 20 MG tablet Take 20 mg by mouth every other day.   . Fluticasone-Salmeterol (ADVAIR DISKUS) 250-50 MCG/DOSE AEPB as needed.  . Garlic Oil 2 MG CAPS Take 1,000 mg by mouth daily.   Marland Kitchen HYDROcodone-acetaminophen (NORCO/VICODIN) 5-325 MG per tablet as needed.  Marland Kitchen ibuprofen (ADVIL,MOTRIN) 800 MG tablet 1 tablet as needed.  Marland Kitchen imipramine (TOFRANIL) 25 MG tablet Take 25 mg by mouth daily.  . insulin glargine (LANTUS) 100 UNIT/ML injection Inject 0.62 mLs (62 Units total) into the skin at bedtime.  . Insulin Pen Needle (NOVOFINE) 30G X 8 MM MISC   . Insulin Pen Needle (UNIFINE PENTIPS) 31G X 5 MM MISC   . Liraglutide (VICTOZA) 18 MG/3ML SOPN Frequency:PHARMDIR   Dosage:0.0     Instructions:  Note:1.61mL SQ daily Dose: 0.6MG /0.1ML  . metFORMIN (GLUCOPHAGE) 1000 MG tablet Take 1,000 mg by mouth 2 (two) times daily with a meal.   . TRUEPLUS LANCETS 30G MISC USE AS DIRECTED 3 TIMES A DAY  . TRUETEST TEST test strip USE AS DIRECTED 3 TIMES A DAY   No current facility-administered medications on file prior to visit.    Review of Systems  Constitutional: Negative for malaise/fatigue.  Eyes: Negative for blurred vision.  Cardiovascular: Negative for chest pain and palpitations.  Neurological: Negative for dizziness and headaches.  Psychiatric/Behavioral: The patient is nervous/anxious.    Per HPI unless specifically indicated above     Objective:    BP 151/80 mmHg  Pulse 80  Temp(Src) 97.9 F (36.6 C) (Oral)  Resp 16  Ht 5' 10.5" (1.791 m)  Wt 241 lb 9.6 oz (109.589 kg)  BMI 34.16 kg/m2  Wt Readings from Last 3 Encounters:  11/02/15 241 lb 9.6 oz (109.589 kg)  09/16/15 229 lb 14.4 oz (104.282 kg)  07/29/15 230 lb 3.2 oz (104.418 kg)     Physical Exam Results for orders placed or performed in visit on 10/14/15  HM DIABETES EYE EXAM  Result Value Ref Range   HM Diabetic Eye Exam No Retinopathy No Retinopathy      Assessment & Plan:   Problem List Items Addressed This Visit    None    Visit Diagnoses    Type 2 diabetes mellitus without complication, unspecified long term insulin use status ([place])    -  Primary    Relevant Orders    POCT HgB A1C       No orders of the defined types were placed in this encounter.      Follow up plan: No Follow-up on file.

## 2015-11-02 NOTE — Patient Instructions (Signed)
Please check your blood glucose 2 times daily or as needed. If your glucose is < 70 mg/dl or you have symptoms of hypoglycemia confusion, dizziness, headache, hunger, jitteriness and sweating please drink 4 oz of juice or soda.  Check blood glucose 15 minutes later. If it has not risen to >100, please seek medical attention. If > 100 please eat a snack containing protein such as peanut butter and crackers.  IF you have low blood sugars, please call us.   Your goal blood pressure is 140/90 Work on low salt/sodium diet - goal <1.5gm (1,500mg ) per day. Eat a diet high in fruits/vegetables and whole grains.  Look into mediterranean and DASH diet. Goal activity is 168min/wk of moderate intensity exercise.  This can be split into 30 minute chunks.  If you are not at this level, you can start with smaller 10-15 min increments and slowly build up activity. Look at [email or web address] for more resources

## 2015-11-02 NOTE — Assessment & Plan Note (Signed)
Managed by [facility] neurology. Followed up with Dr. Jacqlyn Larsen 2 weeks ago.

## 2015-11-02 NOTE — Assessment & Plan Note (Signed)
Continue pt current regimen at home. Have Discussed limiting to 3 agents. Pt wants to try to continue with amaryl. Hypoglycemia protocol discussed. ACE for renal protection.  CMP and lipid.  3 mos for A1C check. Microalbumin needed. Eye exam UTD. Foot exam done.

## 2015-11-02 NOTE — Assessment & Plan Note (Signed)
Check lipid panel.  Pt may be candidate for statin due to DM.  Will discuss pending lab results.

## 2015-11-03 LAB — COMPREHENSIVE METABOLIC PANEL
ALBUMIN: 4.5 g/dL (ref 3.6–4.8)
ALK PHOS: 59 IU/L (ref 39–117)
ALT: 23 IU/L (ref 0–44)
AST: 18 IU/L (ref 0–40)
Albumin/Globulin Ratio: 2.4 (ref 1.1–2.5)
BILIRUBIN TOTAL: 0.3 mg/dL (ref 0.0–1.2)
BUN / CREAT RATIO: 14 (ref 10–22)
BUN: 15 mg/dL (ref 8–27)
CHLORIDE: 98 mmol/L (ref 97–106)
CO2: 26 mmol/L (ref 18–29)
CREATININE: 1.08 mg/dL (ref 0.76–1.27)
Calcium: 9 mg/dL (ref 8.6–10.2)
GFR calc non Af Amer: 72 mL/min/{1.73_m2} (ref >59)
GFR, EST AFRICAN AMERICAN: 83 mL/min/{1.73_m2} (ref >59)
GLOBULIN, TOTAL: 1.9 g/dL (ref 1.5–4.5)
Glucose: 94 mg/dL (ref 65–99)
Potassium: 4.7 mmol/L (ref 3.5–5.2)
SODIUM: 139 mmol/L (ref 136–144)
TOTAL PROTEIN: 6.4 g/dL (ref 6.0–8.5)

## 2015-11-03 LAB — LIPID PANEL
CHOLESTEROL TOTAL: 197 mg/dL (ref 100–199)
Chol/HDL Ratio: 5.8 ratio units — ABNORMAL HIGH (ref 0.0–5.0)
HDL: 34 mg/dL — AB (ref >39)
LDL Calculated: 132 mg/dL — ABNORMAL HIGH (ref 0–99)
TRIGLYCERIDES: 157 mg/dL — AB (ref 0–149)
VLDL CHOLESTEROL CAL: 31 mg/dL (ref 5–40)

## 2015-11-17 ENCOUNTER — Other Ambulatory Visit: Payer: Self-pay

## 2015-11-17 VITALS — BP 132/76 | Ht 70.5 in | Wt 239.6 lb

## 2015-11-17 DIAGNOSIS — R739 Hyperglycemia, unspecified: Secondary | ICD-10-CM

## 2015-11-17 DIAGNOSIS — E1165 Type 2 diabetes mellitus with hyperglycemia: Secondary | ICD-10-CM

## 2015-11-17 DIAGNOSIS — Z794 Long term (current) use of insulin: Secondary | ICD-10-CM

## 2015-11-17 NOTE — Patient Outreach (Signed)
[facility]) Care Management  [place]  11/17/2015   [name] 1951-05-15 [id number]  Subjective: Henry Horne comes for his regularly scheduled link to wellness visit. He has gone back on his Prozac every day and is no longer depressed.  He saw the NP in early December who increased his BP medication- I have given him a BP cuff so he can check it daily and bring those records to the next visit.  He complains of being very dizzy when he bends over at work.   He started Glimepiride 2mg  bid because he found his blood sugars were running high (he had this medication at home- he had previously used it).  He started to experience low blood sugars so backed off on the medication and only taking 1mg  qam and average blood sugars are much improved- 30 day average 130mg /dl. Taking all other medications as ordered.   Patient states he is unhappy with weight gain- tells me he's eating large portions and increased sweets.  Objective:  Filed Vitals:   11/17/15 1028  BP: 132/76  Height: 1.791 m (5' 10.5")  Weight: 239 lb 9.6 oz (108.682 kg)     Current Medications:  Current Outpatient Prescriptions  Medication Sig Dispense Refill  . albuterol (PROAIR HFA) 108 (90 BASE) MCG/ACT inhaler as needed.    Marland Kitchen amLODipine (NORVASC) 5 MG tablet Take 1 tablet (5 mg total) by mouth daily. 90 tablet 3  . benazepril (LOTENSIN) 40 MG tablet Take 40 mg by mouth daily. 1 tablet once a day    . esomeprazole (NEXIUM) 40 MG capsule Take 40 mg by mouth every other day.    Marland Kitchen FLUoxetine (PROZAC) 20 MG tablet Take 20 mg by mouth every other day.     . Fluticasone-Salmeterol (ADVAIR DISKUS) 250-50 MCG/DOSE AEPB as needed.    Marland Kitchen glimepiride (AMARYL) 1 MG tablet Take 1 tablet (1 mg total) by mouth daily with breakfast. 90 tablet 3  . HYDROcodone-acetaminophen (NORCO/VICODIN) 5-325 MG tablet Take 1 tablet by mouth as needed. 30 tablet 0  . ibuprofen (ADVIL,MOTRIN) 800 MG tablet 1 tablet as needed.    Marland Kitchen  imipramine (TOFRANIL) 25 MG tablet Take 25 mg by mouth daily.    . insulin glargine (LANTUS) 100 UNIT/ML injection Inject 0.62 mLs (62 Units total) into the skin at bedtime. 10 mL 12  . Insulin Pen Needle (NOVOFINE) 30G X 8 MM MISC     . Insulin Pen Needle (UNIFINE PENTIPS) 31G X 5 MM MISC     . Liraglutide (VICTOZA) 18 MG/3ML SOPN Frequency:PHARMDIR   Dosage:0.0     Instructions:  Note:1.100mL SQ daily Dose: 0.6MG /0.1ML    . metFORMIN (GLUCOPHAGE) 1000 MG tablet Take 1,000 mg by mouth 2 (two) times daily with a meal.     . TRUEPLUS LANCETS 30G MISC USE AS DIRECTED 3 TIMES A DAY 300 each 1  . TRUETEST TEST test strip USE AS DIRECTED 3 TIMES A DAY [id number] each 1  . Garlic Oil 2 MG CAPS Take 1,000 mg by mouth daily. Reported on 11/17/2015     No current facility-administered medications for this visit.    Functional Status:  In your present state of health, do you have any difficulty performing the following activities: 09/16/2015 07/29/2015  Hearing? N N  Vision? N N  Difficulty concentrating or making decisions? N N  Walking or climbing stairs? N N  Dressing or bathing? N N  Doing errands, shopping? N N    Fall/Depression  Screening: PHQ 2/9 Scores 11/17/2015 09/16/2015 07/29/2015 06/16/2015  PHQ - 2 Score 0 4 0 2  PHQ- 9 Score - 7 - 8    Assessment: Proactive patient- taking meds, checking blood sugars- verbalizes a desire to make changes to lose weight and improve A1C.   Plan: The goal is to lower A1C to less than the present 7.4%- patient will check BG daily, limit intake to 3 meals and 2 snacks daily.  Eat sweets a maximum of 3x/week, fried food max. 1x/day.  Patient will check BP daily and bring a written report to MD in early January.   Follow up with me in late January 2017.    Gentry Fitz, RN, BA, [place], [place] Direct Dial:  [phone]  Fax:  [phone] E-mail: [email or web address] [address] [place], [place]  [id number]

## 2015-12-02 ENCOUNTER — Ambulatory Visit: Payer: 59 | Admitting: Family Medicine

## 2015-12-09 DIAGNOSIS — N3000 Acute cystitis without hematuria: Secondary | ICD-10-CM | POA: Diagnosis not present

## 2015-12-15 ENCOUNTER — Ambulatory Visit (INDEPENDENT_AMBULATORY_CARE_PROVIDER_SITE_OTHER): Payer: 59 | Admitting: Family Medicine

## 2015-12-15 VITALS — BP 107/70 | HR 84 | Temp 98.3°F | Resp 16 | Ht 70.0 in | Wt 226.4 lb

## 2015-12-15 DIAGNOSIS — I10 Essential (primary) hypertension: Secondary | ICD-10-CM | POA: Diagnosis not present

## 2015-12-15 DIAGNOSIS — A09 Infectious gastroenteritis and colitis, unspecified: Secondary | ICD-10-CM

## 2015-12-15 MED ORDER — BISMUTH SUBSALICYLATE 262 MG PO CHEW: 524.0000 mg | CHEWABLE_TABLET | ORAL | Status: DC | PRN

## 2015-12-15 NOTE — Assessment & Plan Note (Signed)
Monitor BP closely.  Hold BP medications for low BP while ill.

## 2015-12-15 NOTE — Patient Instructions (Signed)
   We will treat your symptoms with supportive care: Ensure you are drinking plenty of fluids- I recommend diluting gatorade 2oz to 6 oz water or drinking chicken broth for electroyltes and to boost your blood sugar. Start with bland meals when you feel up to it. Drinking is the most important- you do not need to eat if you don't feel well. Try peptobismal to help with your symptoms.  Alarm signs: If you noticed blood in your diarrhea, diarrhea continues for > 1.5-2 weeks without improving, shortness of breath, chest pain, or any concerning symptoms, please seek medical attention.

## 2015-12-15 NOTE — Progress Notes (Signed)
Subjective:    Patient ID: Henry Horne, male    DOB: [birth year], 65 y.o.   MRN: [id number]  HPI: Henry Horne is a 65 y.o. male presenting on 12/15/2015 for Diarrhea   HPI  Pt had a stomach virus last week- N/V/D.  Last for about 2-3 days and improved. No vomiting. Still having diarrhea. Unsure if he ever had solid bowel movements. Having liquid stools. Stomach gurgling, belching, gas. About 5 bowel movements per day. No blood in the stool. Taking pepto bismal last week- mild improvement. Pale color. No blood.  No recent Abx use. No recent travel or swimming in fresh water. No HA, No dizzines, drinking plenty of fluids.   Past Medical History  Diagnosis Date  . Anxiety   . Depression   . Hypertension   . Hyperlipidemia   . Joint pain   . Emphysema of lung ([place]) 1996  . Prostate cancer ([place]) 2008    removed with cryotherapy  . Cancer of kidney ([place]) 2010    cystoscopy 2010, 2014, 2015, 2016- MD q 6 months    Current Outpatient Prescriptions on File Prior to Visit  Medication Sig  . albuterol (PROAIR HFA) 108 (90 BASE) MCG/ACT inhaler as needed.  Marland Kitchen amLODipine (NORVASC) 5 MG tablet Take 1 tablet (5 mg total) by mouth daily.  . benazepril (LOTENSIN) 40 MG tablet Take 40 mg by mouth daily. 1 tablet once a day  . esomeprazole (NEXIUM) 40 MG capsule Take 40 mg by mouth every other day.  Marland Kitchen FLUoxetine (PROZAC) 20 MG tablet Take 20 mg by mouth every other day.   . Fluticasone-Salmeterol (ADVAIR DISKUS) 250-50 MCG/DOSE AEPB as needed.  . Garlic Oil 2 MG CAPS Take 1,000 mg by mouth daily. Reported on 11/17/2015  . glimepiride (AMARYL) 1 MG tablet Take 1 tablet (1 mg total) by mouth daily with breakfast.  . HYDROcodone-acetaminophen (NORCO/VICODIN) 5-325 MG tablet Take 1 tablet by mouth as needed.  Marland Kitchen ibuprofen (ADVIL,MOTRIN) 800 MG tablet 1 tablet as needed.  Marland Kitchen imipramine (TOFRANIL) 25 MG tablet Take 25 mg by mouth daily.  . insulin glargine (LANTUS) 100 UNIT/ML injection Inject  0.62 mLs (62 Units total) into the skin at bedtime.  . Insulin Pen Needle (NOVOFINE) 30G X 8 MM MISC   . Insulin Pen Needle (UNIFINE PENTIPS) 31G X 5 MM MISC   . Liraglutide (VICTOZA) 18 MG/3ML SOPN Frequency:PHARMDIR   Dosage:0.0     Instructions:  Note:1.33mL SQ daily Dose: 0.6MG /0.1ML  . metFORMIN (GLUCOPHAGE) 1000 MG tablet Take 1,000 mg by mouth 2 (two) times daily with a meal.   . TRUEPLUS LANCETS 30G MISC USE AS DIRECTED 3 TIMES A DAY  . TRUETEST TEST test strip USE AS DIRECTED 3 TIMES A DAY   No current facility-administered medications on file prior to visit.    Review of Systems  Constitutional: Negative for fever and chills.  HENT: Negative.   Respiratory: Negative for chest tightness, shortness of breath and wheezing.   Cardiovascular: Negative for chest pain, palpitations and leg swelling.  Gastrointestinal: Positive for diarrhea. Negative for nausea, vomiting, abdominal pain, blood in stool and anal bleeding.  Endocrine: Negative.   Genitourinary: Negative for dysuria, urgency, discharge, penile pain and testicular pain.  Musculoskeletal: Negative for back pain, joint swelling and arthralgias.  Skin: Negative.   Neurological: Negative for dizziness, weakness, numbness and headaches.  Psychiatric/Behavioral: Negative for sleep disturbance and dysphoric mood.   Per HPI unless specifically indicated above  Objective:    BP 107/70 mmHg  Pulse 84  Temp(Src) 98.3 F (36.8 C) (Oral)  Resp 16  Ht 5\' 10"  (1.778 m)  Wt 226 lb 6.4 oz (102.694 kg)  BMI 32.48 kg/m2  Wt Readings from Last 3 Encounters:  12/15/15 226 lb 6.4 oz (102.694 kg)  11/17/15 239 lb 9.6 oz (108.682 kg)  11/02/15 241 lb 9.6 oz (109.589 kg)    Physical Exam  Constitutional: He is oriented to person, place, and time. He appears well-developed and well-nourished. No distress.  HENT:  Head: Normocephalic and atraumatic.  Neck: Neck supple. No thyromegaly present.  Cardiovascular: Normal rate, regular  rhythm and normal heart sounds.  Exam reveals no gallop and no friction rub.   No murmur heard. Pulmonary/Chest: Effort normal and breath sounds normal. He has no wheezes.  Abdominal: Soft. He exhibits no distension. Bowel sounds are increased. There is no hepatosplenomegaly. There is no tenderness. There is no rebound and no CVA tenderness.  Musculoskeletal: Normal range of motion. He exhibits no edema or tenderness.  Neurological: He is alert and oriented to person, place, and time. He has normal reflexes.  Skin: Skin is warm and dry. No rash noted. No erythema.  Psychiatric: He has a normal mood and affect. His behavior is normal. Thought content normal.   Results for orders placed or performed in visit on 11/02/15  Lipid panel  Result Value Ref Range   Cholesterol, Total 197 100 - 199 mg/dL   Triglycerides 157 (H) 0 - 149 mg/dL   HDL 34 (L) >39 mg/dL   VLDL Cholesterol Cal 31 5 - 40 mg/dL   LDL Calculated 132 (H) 0 - 99 mg/dL   Chol/HDL Ratio 5.8 (H) 0.0 - 5.0 ratio units  Comprehensive metabolic panel  Result Value Ref Range   Glucose 94 65 - 99 mg/dL   BUN 15 8 - 27 mg/dL   Creatinine, Ser 1.08 0.76 - 1.27 mg/dL   GFR calc non Af Amer 72 >59 mL/min/1.73   GFR calc Af Amer 83 >59 mL/min/1.73   BUN/Creatinine Ratio 14 10 - 22   Sodium 139 136 - 144 mmol/L   Potassium 4.7 3.5 - 5.2 mmol/L   Chloride 98 97 - 106 mmol/L   CO2 26 18 - 29 mmol/L   Calcium 9.0 8.6 - 10.2 mg/dL   Total Protein 6.4 6.0 - 8.5 g/dL   Albumin 4.5 3.6 - 4.8 g/dL   Globulin, Total 1.9 1.5 - 4.5 g/dL   Albumin/Globulin Ratio 2.4 1.1 - 2.5   Bilirubin Total 0.3 0.0 - 1.2 mg/dL   Alkaline Phosphatase 59 39 - 117 IU/L   AST 18 0 - 40 IU/L   ALT 23 0 - 44 IU/L  POCT HgB A1C  Result Value Ref Range   Hemoglobin A1C 7.4       Assessment & Plan:   Problem List Items Addressed This Visit      Cardiovascular and Mediastinum   HBP (high blood pressure)    Monitor BP closely.  Hold BP medications for low  BP while ill.         Other Visit Diagnoses    Diarrhea of infectious origin    -  Primary    Likely viral etioloy. Stool cultures ordered to r/o C Diff or parasites. Encouraged PO fluids. Alarm symptoms reviewed.  Pt to ER with signs of dehydration or bloody diarrhea.    Relevant Medications    bismuth subsalicylate (PEPTO-BISMOL) 262 MG chewable  tablet    Other Relevant Orders    Stool Culture    Stool C-Diff Toxin Assay    Ova and parasite examination       Meds ordered this encounter  Medications  . bismuth subsalicylate (PEPTO-BISMOL) 262 MG chewable tablet    Sig: Chew 2 tablets (524 mg total) by mouth as needed.    Dispense:  30 tablet    Refill:  0    Order Specific Question:  Supervising Provider    Answer:  Arlis Porta [id number]      Follow up plan: Return in about 2 weeks (around 12/29/2015) for diartrhea. Marland Kitchen

## 2015-12-16 DIAGNOSIS — A09 Infectious gastroenteritis and colitis, unspecified: Secondary | ICD-10-CM | POA: Diagnosis not present

## 2015-12-19 LAB — OVA AND PARASITE EXAMINATION

## 2015-12-20 LAB — STOOL CULTURE: E COLI SHIGA TOXIN ASSAY: NEGATIVE

## 2015-12-25 ENCOUNTER — Other Ambulatory Visit: Payer: Self-pay

## 2015-12-25 DIAGNOSIS — E1165 Type 2 diabetes mellitus with hyperglycemia: Secondary | ICD-10-CM

## 2015-12-25 DIAGNOSIS — R739 Hyperglycemia, unspecified: Secondary | ICD-10-CM

## 2015-12-25 DIAGNOSIS — IMO0002 Reserved for concepts with insufficient information to code with codable children: Secondary | ICD-10-CM | POA: Insufficient documentation

## 2015-12-25 DIAGNOSIS — E1142 Type 2 diabetes mellitus with diabetic polyneuropathy: Secondary | ICD-10-CM | POA: Insufficient documentation

## 2015-12-25 NOTE — Patient Outreach (Signed)
[facility]) Care Management  12/25/2015  Henry Horne [birth year] [id number]

## 2015-12-28 NOTE — Patient Outreach (Signed)
[facility]) Care Management  12/28/2015  DYONTE COSGROVE 05/26/1951 [id number]   Spoke with Kerin Ransom regarding his blood sugar.  He was taking Glipizide 1mg  bid, Metformin 1000 bid, Lantus 62 units qday but was having some low blood sugars so he decreased his Victoza to 1.2mg /day.  He was also experiencing some double vision and decreased his Amlodipine to 2.5mg  from 5mg /day.  Checking BP daily; 120/69, 134/72, 153/79, 164/95, 166/90, 157/90. 145/83, 167/97.   Encouraged to take Victoza 1.8mg  as ordered and since the he was taking more Glipizide than ordered, recommended he decrease to 1mg /day and make sure he's eating every meal with carbs and proteins.  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   Some elevated blood sugars when he forgets to take his insulin. 7 day BG average= 153mg /dl, 14 day=149mg /dl, 30 day average=148mg /dl.    Gentry Fitz, RN, BA, [place], [place] Direct Dial:  [phone]  Fax:  [phone] E-mail: [email or web address] [address], [place], [place]  [address]

## 2015-12-31 ENCOUNTER — Other Ambulatory Visit: Payer: Self-pay | Admitting: Family Medicine

## 2016-01-25 ENCOUNTER — Other Ambulatory Visit: Payer: Self-pay | Admitting: Family Medicine

## 2016-01-26 ENCOUNTER — Other Ambulatory Visit: Payer: Self-pay

## 2016-01-26 NOTE — Patient Outreach (Signed)
[facility]) Care Management  01/26/2016  SLY MEDOR 14-Jan-1951 [id number]  Called patient to get an update on his blood sugars- no answer.  Left a message to return my call.   Gentry Fitz, RN, BA, [place], [place] Direct Dial:  [phone]  Fax:  [phone] E-mail: [email or web address] [address], [place], [place]  [address]

## 2016-02-03 ENCOUNTER — Other Ambulatory Visit: Payer: Self-pay

## 2016-02-03 NOTE — Patient Outreach (Signed)
[facility]) Care Management  02/03/2016  Henry Horne [birth year] [id number]   [name] left a message saying his blood sugars were elevated and he has scheduled an appointment with his MD on 02/09/16.  He will request an A1C at that time.  He did leave word that his hours are going to change at [employer] and he feels like it will be beneficial for his blood sugar control.   Gentry Fitz, RN, BA, [place], [place] Direct Dial:  [phone]  Fax:  [phone] E-mail: [email or web address] [address], [place], [place]  [address]

## 2016-02-09 ENCOUNTER — Ambulatory Visit (INDEPENDENT_AMBULATORY_CARE_PROVIDER_SITE_OTHER): Payer: 59 | Admitting: Family Medicine

## 2016-02-09 ENCOUNTER — Encounter: Payer: Self-pay | Admitting: Family Medicine

## 2016-02-09 VITALS — BP 140/78 | HR 73 | Temp 97.9°F | Resp 16 | Ht 70.0 in | Wt 242.0 lb

## 2016-02-09 DIAGNOSIS — E785 Hyperlipidemia, unspecified: Secondary | ICD-10-CM | POA: Diagnosis not present

## 2016-02-09 DIAGNOSIS — E1140 Type 2 diabetes mellitus with diabetic neuropathy, unspecified: Secondary | ICD-10-CM

## 2016-02-09 DIAGNOSIS — E1169 Type 2 diabetes mellitus with other specified complication: Secondary | ICD-10-CM

## 2016-02-09 DIAGNOSIS — I10 Essential (primary) hypertension: Secondary | ICD-10-CM | POA: Diagnosis not present

## 2016-02-09 DIAGNOSIS — Z794 Long term (current) use of insulin: Secondary | ICD-10-CM | POA: Diagnosis not present

## 2016-02-09 LAB — POCT GLYCOSYLATED HEMOGLOBIN (HGB A1C): HEMOGLOBIN A1C: 8

## 2016-02-09 MED ORDER — EZETIMIBE 10 MG PO TABS: 10.0000 mg | ORAL_TABLET | Freq: Every day | ORAL | Status: DC

## 2016-02-09 NOTE — Progress Notes (Signed)
Subjective:    Patient ID: Henry Horne, male    DOB: Jun 29, 1951, 65 y.o.   MRN: [id number]  HPI: [name] is a 65 y.o. male presenting on 02/09/2016 for Diabetes   HPI  Pt presents for diabetes follow-up. His blood sugars have been up at home. Fasting sugars have been 200mg /dl. Is taking 1/2 tablet of glimepiride in AM and PM. Taking lantus 62 units.  Feels he is eating more/different. Not eating vegetables as much. Gaining weight. No exercise. Still walking. Some numbness "leather feeling in his feet."  Victoza in the AM. Eye exam is due in August. Will see podiatry about toenails this spring.  HTN: is doing well at home. Taking 5mg  amlodipine daily. No dizziness. No visual changes. No chest pain. No shortness of breath.   Cholesterol: Not on a statin. Has taken 2 different statins in the past. Joint issues with all statins.   Past Medical History  Diagnosis Date  . Anxiety   . Depression   . Hypertension   . Hyperlipidemia   . Joint pain   . Emphysema of lung ([place]) 1996  . Prostate cancer ([place]) 2008    removed with cryotherapy  . Cancer of kidney ([place]) 2010    cystoscopy 2010, 2014, 2015, 2016- MD q 6 months    Current Outpatient Prescriptions on File Prior to Visit  Medication Sig  . albuterol (PROAIR HFA) 108 (90 BASE) MCG/ACT inhaler as needed.  Marland Kitchen amLODipine (NORVASC) 5 MG tablet Take 1 tablet (5 mg total) by mouth daily. (Patient taking differently: Take 5 mg by mouth daily. Taking 2.5mg - was experiencing double vision so he decreased the dose)  . benazepril (LOTENSIN) 40 MG tablet TAKE 1 TABLET BY MOUTH ONCE DAILY  . bismuth subsalicylate (PEPTO-BISMOL) 262 MG chewable tablet Chew 2 tablets (524 mg total) by mouth as needed.  Marland Kitchen esomeprazole (NEXIUM) 40 MG capsule Take 40 mg by mouth every other day.  Marland Kitchen FLUoxetine (PROZAC) 20 MG capsule TAKE 1 CAPSULE BY MOUTH ONCE DAILY  . Fluticasone-Salmeterol (ADVAIR DISKUS) 250-50 MCG/DOSE AEPB as needed.  . Garlic Oil 2  MG CAPS Take 1,000 mg by mouth daily. Reported on 11/17/2015  . HYDROcodone-acetaminophen (NORCO/VICODIN) 5-325 MG tablet Take 1 tablet by mouth as needed.  Marland Kitchen ibuprofen (ADVIL,MOTRIN) 800 MG tablet 1 tablet as needed.  Marland Kitchen imipramine (TOFRANIL) 25 MG tablet Take 25 mg by mouth daily.  . insulin glargine (LANTUS) 100 UNIT/ML injection Inject 0.62 mLs (62 Units total) into the skin at bedtime.  . Insulin Pen Needle (NOVOFINE) 30G X 8 MM MISC   . Insulin Pen Needle (UNIFINE PENTIPS) 31G X 5 MM MISC   . metFORMIN (GLUCOPHAGE) 1000 MG tablet TAKE 1 TABLET BY MOUTH TWICE DAILY  . TRUEPLUS LANCETS 30G MISC USE AS DIRECTED 3 TIMES A DAY  . TRUETEST TEST test strip USE AS DIRECTED 3 TIMES A DAY  . VICTOZA 18 MG/3ML SOPN INJECT 1.8MG  SUBCUTANEOUSLY IN THE MORNING   No current facility-administered medications on file prior to visit.    Review of Systems  Constitutional: Negative for fever and chills.  HENT: Negative.   Respiratory: Negative for chest tightness, shortness of breath and wheezing.   Cardiovascular: Negative for chest pain, palpitations and leg swelling.  Gastrointestinal: Negative for nausea, vomiting and abdominal pain.  Endocrine: Negative.   Genitourinary: Negative for dysuria, urgency, discharge, penile pain and testicular pain.  Musculoskeletal: Negative for back pain, joint swelling and arthralgias.  Skin: Negative.  Loss of toe nails. Callus on bottom of foot.   Neurological: Positive for numbness (leather feeling in feet). Negative for dizziness, weakness and headaches.  Psychiatric/Behavioral: Negative for sleep disturbance and dysphoric mood.   Per HPI unless specifically indicated above     Objective:    BP 140/78 mmHg  Pulse 73  Temp(Src) 97.9 F (36.6 C) (Oral)  Resp 16  Ht 5\' 10"  (1.778 m)  Wt 242 lb (109.77 kg)  BMI 34.72 kg/m2  Wt Readings from Last 3 Encounters:  02/09/16 242 lb (109.77 kg)  12/15/15 226 lb 6.4 oz (102.694 kg)  11/17/15 239 lb 9.6  oz (108.682 kg)    Physical Exam  Constitutional: He is oriented to person, place, and time. He appears well-developed and well-nourished. No distress.  HENT:  Head: Normocephalic and atraumatic.  Neck: Neck supple. No thyromegaly present.  Cardiovascular: Normal rate, regular rhythm and normal heart sounds.  Exam reveals no gallop and no friction rub.   No murmur heard. Pulmonary/Chest: Effort normal and breath sounds normal. He has no wheezes.  Abdominal: Soft. Bowel sounds are normal. He exhibits no distension. There is no tenderness. There is no rebound.  Musculoskeletal: Normal range of motion. He exhibits no edema or tenderness.  Neurological: He is alert and oriented to person, place, and time. He has normal reflexes.  Skin: Skin is warm and dry. No rash noted. No erythema.  Missing nails on R foot: Great toe, toes 3-5. Missing nails on L foot: 2nd toe, toes 4-5.  Callus on plantar surface of R foot just above the heel.   Psychiatric: He has a normal mood and affect. His behavior is normal. Thought content normal.   Diabetic Foot Exam - Simple   Simple Foot Form  Diabetic Foot exam was performed with the following findings:  Yes 02/09/2016  9:45 AM  Visual Inspection  See comments:  Yes  Sensation Testing  See comments:  Yes  Pulse Check  Posterior Tibialis and Dorsalis pulse intact bilaterally:  Yes  Comments  Missing toe nails both feet. Diminished sensation to monofilament on both feet toes 3-5.      Results for orders placed or performed in visit on 02/09/16  POCT HgB A1C  Result Value Ref Range   Hemoglobin A1C 8       Assessment & Plan:   Problem List Items Addressed This Visit      Cardiovascular and Mediastinum   HBP (high blood pressure) - Primary    Controlled. Continue current regimen. Encouraged DASH diet. Alarm symptoms reviewed.       Relevant Medications   ezetimibe (ZETIA) 10 MG tablet     Endocrine   Diabetes mellitus with neuropathy (HCC)     A1c is elevated from previous. Pt feels it is related solely to diet and lack of exercise. Have discussed starting mealtime insulin to help control sugars. He wants to wait to see if he can lose weight and change diet over 3 mos to get his A1c back down.  Plan to stop glimepiride due to risk of hypoglycemia with insulin, weight gain, and no appreciable results.  Eye exam UTD. Foot exam done. Pt sees podiatry. Will have full foot exam and callus looked at at next visit.  On ACE.  Recheck 3 mos.       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 Have discussed risks/benefits of statin with diabetics. Plan to start Zetia to try and lower LDL towards goal of 70. Discussed diet and lifestyle changes. Recheck 3 mos.       Relevant Medications   ezetimibe (ZETIA) 10 MG tablet      Meds ordered this encounter  Medications  . ezetimibe (ZETIA) 10 MG tablet    Sig: Take 1 tablet (10 mg total) by mouth daily.    Dispense:  90 tablet    Refill:  3    Order Specific Question:  Supervising Provider    Answer:  Arlis Porta [id number]      Follow up plan: Return in about 3 months (around 05/11/2016) for Diabetes. Marland Kitchen

## 2016-02-09 NOTE — Assessment & Plan Note (Signed)
Pt is statin intolerant. Have discussed risks/benefits of statin with diabetics. Plan to start Zetia to try and lower LDL towards goal of 70. Discussed diet and lifestyle changes. Recheck 3 mos.

## 2016-02-09 NOTE — Assessment & Plan Note (Signed)
A1c is elevated from previous. Pt feels it is related solely to diet and lack of exercise. Have discussed starting mealtime insulin to help control sugars. He wants to wait to see if he can lose weight and change diet over 3 mos to get his A1c back down.  Plan to stop glimepiride due to risk of hypoglycemia with insulin, weight gain, and no appreciable results.  Eye exam UTD. Foot exam done. Pt sees podiatry. Will have full foot exam and callus looked at at next visit.  On ACE.  Recheck 3 mos.

## 2016-02-09 NOTE — Patient Instructions (Signed)
Diabetes: Take 1/2 tablet of glimepiride daily for 1 week and then stop. Stop any concentrated sweets. Avoid sugar sweetened beverages. Goal is 167minutes of moderate activity per week. 30-40 minutes 3-4 times per week.  We will try lifestyle and diet changes for 3 mos and then plan to start mealtime insulin.  Let's add Zetia for your cholesterol.  Please seek immediate medical attention at ER or Urgent Care if you develop: Chest pain, pressure or tightness. Shortness of breath accompanied by nausea or diaphoresis Visual changes Numbness or tingling on one side of the body Facial droop Altered mental status Or any concerning symptoms.

## 2016-02-09 NOTE — Assessment & Plan Note (Signed)
Controlled. Continue current regimen. Encouraged DASH diet. Alarm symptoms reviewed.

## 2016-02-10 ENCOUNTER — Other Ambulatory Visit: Payer: Self-pay

## 2016-02-10 NOTE — Patient Outreach (Signed)
[facility]) Care Management  02/10/2016  Henry Horne [birth year] [id number]   [name] reports A1C 8.0% at MD, weight 244lbs. 7 day average 173mg /dl,  14 day average 183mg /dl, and 30 day average 177mg /dl.   Gentry Fitz, RN, BA, [place], [place] Direct Dial:  [phone]  Fax:  [phone] E-mail: [email or web address] [address], [place], [place]  [address]

## 2016-02-18 ENCOUNTER — Other Ambulatory Visit: Payer: Self-pay | Admitting: Family Medicine

## 2016-02-22 ENCOUNTER — Other Ambulatory Visit: Payer: Self-pay | Admitting: Family Medicine

## 2016-03-09 ENCOUNTER — Other Ambulatory Visit: Payer: Self-pay

## 2016-03-09 ENCOUNTER — Other Ambulatory Visit: Payer: Self-pay | Admitting: Family Medicine

## 2016-03-09 DIAGNOSIS — R739 Hyperglycemia, unspecified: Secondary | ICD-10-CM

## 2016-03-09 DIAGNOSIS — Z794 Long term (current) use of insulin: Principal | ICD-10-CM

## 2016-03-09 DIAGNOSIS — E1165 Type 2 diabetes mellitus with hyperglycemia: Secondary | ICD-10-CM

## 2016-03-09 NOTE — Patient Outreach (Signed)
[facility]) Care Management  03/09/2016  Henry Horne 17-Sep-1951 [id number]  I called [name] today to get an update but had to leave a message.  He has recently seen the doctor.  He has changed shifts at work.  Requested an update re: BG numbers, and goal setting including how many sweets he's eating in a week and how much fried food per week.   Henry Fitz, RN, BA, [place], [employer] Direct Dial:  [phone]  Fax:  [phone] E-mail: [email or web address] [address], [place], [place]  [address]

## 2016-03-09 NOTE — Patient Outreach (Signed)
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  He's working 7:30 to 4:30 pm or 10:30 to 7:30pm.  Because of these new hours, he is being more consistent with his insulin administration.  He is bring his lunch everyday and is eating a sandwich or Hot Pocket.  He carries mints with him but has not had any low blood sugars. He is not eating from [employer] like he has in the past.   Fasting blood sugars have been between 70-150mg /dl.  He will call me to schedule with me when he has a day off.   Gentry Fitz, RN, BA, [place], [place] Direct Dial:  [phone]  Fax:  [phone] E-mail: [email or web address] [address], [place], [place]  [address]

## 2016-05-11 ENCOUNTER — Other Ambulatory Visit: Payer: Self-pay

## 2016-05-11 VITALS — BP 130/62 | HR 63 | Ht 70.0 in | Wt 233.2 lb

## 2016-05-11 LAB — POCT GLYCOSYLATED HEMOGLOBIN (HGB A1C): HEMOGLOBIN A1C: 8.3

## 2016-05-11 NOTE — Patient Outreach (Signed)
[facility]) Care Management  [place]  05/11/2016   Henry Horne 1951/09/22 [id number]  Subjective: Henry Horne was in for his regularly scheduled [employer].  His brought his meter with him.  He complains that fasting blood sugars are 146-2102m/dl (usually greater than 1536mdl).   He complained of congestion, muscle and joint aches after starting on Zetia- he stopped taking it.     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  This is in a non-air conditioned environment and the work is very difficult. He discussed eating too much (his wife is baking a lot since she is at home).    He is taking Lantus 64 units (rotating between stomach and legs), Metformin 10003mid and Victoza 1.8mg61my and tells he remembers to take them most days.   Interestingly his 7 day blood sugar average was 154mg21m 14 day 144mg/53mnd 30 day 159mg/d94mThe last time I met with his 7 day average was 173, 14 day was 183mg/dl8m his 30 day average was 159mg/dl-4msurprised me that his A1C had gone up to 8.3%.  Objective:  Filed Vitals:   05/11/16 0851  BP: 130/62  Pulse: 63  Height: 1.778 m (5' 10" )  Weight: 233 lb 3.2 oz (105.779 kg)  SpO2: 97%   POCT A1C 8.3%  Encounter Medications:  Outpatient Encounter Prescriptions as of 05/11/2016  Medication Sig Note  . albuterol (PROAIR HFA) 108 (90 BASE) MCG/ACT inhaler as needed. 06/16/2015: [facility] 5 MG tablet Take 1 tablet (5 mg total) by mouth daily.   . benazepril (LOTENSIN) 40 MG tablet TAKE 1 TABLET BY MOUTH ONCE DAILY   . esomeprazole (NEXIUM) 40 MG capsule Take 40 mg by mouth every other day. 06/16/2015: [facility]  . FLUoxetMarland Kitchenne (PROZAC) 20 MG capsule TAKE 1 CAPSULE BY MOUTH ONCE DAILY   .  Fluticasone-Salmeterol (ADVAIR DISKUS) 250-50 MCG/DOSE AEPB as needed. 06/16/2015: [facility] 2 MG CAPS Take 1,000 mg by mouth daily. Reported on 11/17/2015 06/16/2015: [facility]  . HYDROcoMarland Kitchenone-acetaminophen (NORCO/VICODIN) 5-325 MG tablet Take 1 tablet by mouth as needed.   . ibuprofMarland Kitchenn (ADVIL,MOTRIN) 800 MG tablet TAKE 1 TABLET BY MOUTH 3 TIMES DAILY AS NEEDED   . imipramine (TOFRANIL) 25 MG tablet Take 25 mg by mouth daily. 06/16/2015: [facility]  . LANTUS Marland KitchenOLOSTAR 100 UNIT/ML Solostar Pen INJECT 56 UNITS SUBCUTANEOUSLY DAILY (Patient taking differently: INJECT 64 UNITS SUBCUTANEOUSLY DAILY)   . metFORMIN (GLUCOPHAGE) 1000 MG tablet TAKE 1 TABLET BY MOUTH TWICE DAILY   . NOVOFINE 30G X 8 MM MISC USE DAILY WITH LANTUS AND VICTOZA   . TRUEPLUS LANCETS 30G MISC USE AS DIRECTED 3 TIMES A DAY   . TRUETEST TEST test strip USE AS DIRECTED 3 TIMES A DAY   . VICTOZA 18 MG/3ML SOPN INJECT 1.8MG SUBCUTANEOUSLY IN THE MORNING   . bismuth subsalicylate (PEPTO-BISMOL) 262 MG chewable tablet Chew 2 tablets (524 mg total) by mouth as needed. (Patient not taking: Reported on 05/11/2016)   . ezetimibe (ZETIA) 10 MG tablet  Take 1 tablet (10 mg total) by mouth daily. (Patient not taking: Reported on 05/11/2016)    No facility-administered encounter medications on file as of 05/11/2016.    Functional Status:  In your present state of health, do you have any difficulty performing the following activities: 05/11/2016 09/16/2015  Hearing? N N  Vision? N N  Difficulty concentrating or making decisions? N N  Walking or climbing stairs? N N  Dressing or bathing? Y N  Doing errands, shopping? N N    Fall/Depression Screening: PHQ 2/9 Scores 05/11/2016 12/28/2015 11/17/2015 09/16/2015 07/29/2015 06/16/2015  PHQ - 2 Score 0 0 0 4 0 2  PHQ- 9 Score - - - 7 - 8     Assessment: Elevated A1C likely as a result of "poor diet choices and stress" as stated by patient. He has an appointment with Dr. Jacqlyn Larsen tomorrow.  He does not have complaints of low blood sugars  Plan:  [facility] CM Care Plan Problem Two        Most Recent Value   Care Plan Problem Two  A1C currently 8.0%   Role Documenting the Problem Two  Care Management Coordinator   Care Plan for Problem Two  Active   Interventions for Problem Two Long Term Goal   Check blood sugar 1x/day- 2hours after a meal- bring meter to MD visit   [facility] Long Term Goal (31-90) days  A1C less than 8.3% when it is checked again in 3 months   [facility] Long Term Goal Start Date  05/11/16 [facility] went from 8% up to 8.3%]     Henry Horne is going to try and "limit" his intake of sweets and eat 3 meals per day to avoid getting too hungry and choosing sweets to relieve the hunger.  Follow up scheduled in 1 month.    Gentry Fitz, RN, BA, [place], [place] Direct Dial:  [phone]  Fax:  [phone] E-mail: [email or web address] [address], [place], [place]  [address]

## 2016-05-12 DIAGNOSIS — C61 Malignant neoplasm of prostate: Secondary | ICD-10-CM | POA: Diagnosis not present

## 2016-05-12 DIAGNOSIS — Z08 Encounter for follow-up examination after completed treatment for malignant neoplasm: Secondary | ICD-10-CM | POA: Diagnosis not present

## 2016-05-12 DIAGNOSIS — Z8551 Personal history of malignant neoplasm of bladder: Secondary | ICD-10-CM | POA: Insufficient documentation

## 2016-05-17 ENCOUNTER — Ambulatory Visit (INDEPENDENT_AMBULATORY_CARE_PROVIDER_SITE_OTHER): Payer: 59 | Admitting: Family Medicine

## 2016-05-17 VITALS — BP 129/64 | HR 69 | Temp 97.9°F | Resp 16 | Ht 70.0 in | Wt 236.4 lb

## 2016-05-17 DIAGNOSIS — Z794 Long term (current) use of insulin: Secondary | ICD-10-CM

## 2016-05-17 DIAGNOSIS — E1169 Type 2 diabetes mellitus with other specified complication: Secondary | ICD-10-CM

## 2016-05-17 DIAGNOSIS — E785 Hyperlipidemia, unspecified: Secondary | ICD-10-CM | POA: Diagnosis not present

## 2016-05-17 DIAGNOSIS — M2550 Pain in unspecified joint: Secondary | ICD-10-CM

## 2016-05-17 DIAGNOSIS — E1140 Type 2 diabetes mellitus with diabetic neuropathy, unspecified: Secondary | ICD-10-CM | POA: Diagnosis not present

## 2016-05-17 DIAGNOSIS — I10 Essential (primary) hypertension: Secondary | ICD-10-CM

## 2016-05-17 LAB — POCT UA - MICROALBUMIN: MICROALBUMIN (UR) POC: 0 mg/L

## 2016-05-17 MED ORDER — HYDROCODONE-ACETAMINOPHEN 5-325 MG PO TABS: 1.0000 | ORAL_TABLET | ORAL | Status: DC | PRN

## 2016-05-17 MED ORDER — INSULIN ASPART 100 UNIT/ML FLEXPEN: 6.0000 [IU] | PEN_INJECTOR | Freq: Three times a day (TID) | SUBCUTANEOUS | Status: DC

## 2016-05-17 NOTE — Assessment & Plan Note (Signed)
A1c is elevated. Start mealtime insulin today. 6 units with each meal of novolog. Pt to check blood sugars TID while on mealtime insulin. Hold if <100 prior to meal. Check BMP and UA microalbumin.  Recheck 4 weeks.

## 2016-05-17 NOTE — Patient Instructions (Addendum)
Diabetes: Start meal-time insulin. Take 6 units with each meal- check sugars with each meal. Hold insulin if mealtime sugar is <100.   Continue regimen as directed.  Take Advair twice daily to help with breathing.

## 2016-05-17 NOTE — Assessment & Plan Note (Signed)
Controlled. Continue current regimen. 

## 2016-05-17 NOTE — Progress Notes (Signed)
Subjective:    Patient ID: Henry Horne, male    DOB: 11-26-1951, 65 y.o.   MRN: [id number]  HPI: Henry Horne is a 65 y.o. male presenting on 05/17/2016 for Diabetes   HPI  Pt presents for diabetes and hyperlipidemia follow-up. Was unable to tolerate Zetia- made him congested and tired, leg cramps, continual cough. Symptoms improved when he stopped the zetia. A1c is up to 8.3%. Has changed jobs is stressed. Getting lots of exercise. No numbness or tingling in feet. Does get cramps in feet. Taking prozac daily now.  Requesting hydrocodone for PRN joint pain. Uses 30 pills in a 6 month period.   Past Medical History  Diagnosis Date  . Anxiety   . Depression   . Hypertension   . Hyperlipidemia   . Joint pain   . Emphysema of lung ([place]) 1996  . Prostate cancer ([place]) 2008    removed with cryotherapy  . Cancer of kidney ([place]) 2010    cystoscopy 2010, 2014, 2015, 2016- MD q 6 months    Current Outpatient Prescriptions on File Prior to Visit  Medication Sig  . albuterol (PROAIR HFA) 108 (90 BASE) MCG/ACT inhaler as needed.  Marland Kitchen amLODipine (NORVASC) 5 MG tablet Take 1 tablet (5 mg total) by mouth daily.  . benazepril (LOTENSIN) 40 MG tablet TAKE 1 TABLET BY MOUTH ONCE DAILY  . bismuth subsalicylate (PEPTO-BISMOL) 262 MG chewable tablet Chew 2 tablets (524 mg total) by mouth as needed.  Marland Kitchen esomeprazole (NEXIUM) 40 MG capsule Take 40 mg by mouth every other day.  Marland Kitchen FLUoxetine (PROZAC) 20 MG capsule TAKE 1 CAPSULE BY MOUTH ONCE DAILY  . Fluticasone-Salmeterol (ADVAIR DISKUS) 250-50 MCG/DOSE AEPB as needed.  . Garlic Oil 2 MG CAPS Take 1,000 mg by mouth daily. Reported on 11/17/2015  . ibuprofen (ADVIL,MOTRIN) 800 MG tablet TAKE 1 TABLET BY MOUTH 3 TIMES DAILY AS NEEDED  . imipramine (TOFRANIL) 25 MG tablet Take 25 mg by mouth daily.  Marland Kitchen LANTUS SOLOSTAR 100 UNIT/ML Solostar Pen INJECT 56 UNITS SUBCUTANEOUSLY DAILY (Patient taking differently: INJECT 64 UNITS SUBCUTANEOUSLY DAILY)  .  metFORMIN (GLUCOPHAGE) 1000 MG tablet TAKE 1 TABLET BY MOUTH TWICE DAILY  . NOVOFINE 30G X 8 MM MISC USE DAILY WITH LANTUS AND VICTOZA  . TRUEPLUS LANCETS 30G MISC USE AS DIRECTED 3 TIMES A DAY  . TRUETEST TEST test strip USE AS DIRECTED 3 TIMES A DAY  . VICTOZA 18 MG/3ML SOPN INJECT 1.8MG  SUBCUTANEOUSLY IN THE MORNING  . ezetimibe (ZETIA) 10 MG tablet Take 1 tablet (10 mg total) by mouth daily. (Patient not taking: Reported on 05/17/2016)   No current facility-administered medications on file prior to visit.    Review of Systems  Constitutional: Negative for fever and chills.  HENT: Negative.   Respiratory: Negative for chest tightness, shortness of breath and wheezing.   Cardiovascular: Negative for chest pain, palpitations and leg swelling.  Gastrointestinal: Negative for nausea, vomiting and abdominal pain.  Endocrine: Negative.   Genitourinary: Negative for dysuria, urgency, discharge, penile pain and testicular pain.  Musculoskeletal: Negative for back pain, joint swelling and arthralgias.  Skin: Negative.   Neurological: Negative for dizziness, weakness, numbness and headaches.  Psychiatric/Behavioral: Negative for sleep disturbance and dysphoric mood.   Per HPI unless specifically indicated above     Objective:    BP 129/64 mmHg  Pulse 69  Temp(Src) 97.9 F (36.6 C) (Oral)  Resp 16  Ht 5\' 10"  (1.778 m)  Wt 236 lb 6.4  oz (107.23 kg)  BMI 33.92 kg/m2  Wt Readings from Last 3 Encounters:  05/17/16 236 lb 6.4 oz (107.23 kg)  05/11/16 233 lb 3.2 oz (105.779 kg)  02/09/16 242 lb (109.77 kg)    Physical Exam  Constitutional: He is oriented to person, place, and time. He appears well-developed and well-nourished. No distress.  HENT:  Head: Normocephalic and atraumatic.  Neck: Neck supple. No thyromegaly present.  Cardiovascular: Normal rate, regular rhythm and normal heart sounds.  Exam reveals no gallop and no friction rub.   No murmur heard. Pulmonary/Chest: Effort  normal and breath sounds normal. He has no wheezes.  Abdominal: Soft. Bowel sounds are normal. He exhibits no distension. There is no tenderness. There is no rebound.  Musculoskeletal: Normal range of motion. He exhibits no edema or tenderness.  Neurological: He is alert and oriented to person, place, and time. He has normal reflexes.  Skin: Skin is warm and dry. No rash noted. No erythema.  Psychiatric: He has a normal mood and affect. His behavior is normal. Thought content normal.   Diabetic Foot Exam - Simple   Simple Foot Form  Diabetic Foot exam was performed with the following findings:  Yes 05/17/2016  9:36 AM  Visual Inspection  No deformities, no ulcerations, no other skin breakdown bilaterally:  Yes  Sensation Testing  Intact to touch and monofilament testing bilaterally:  Yes  Pulse Check  Posterior Tibialis and Dorsalis pulse intact bilaterally:  Yes  Comments     f Results for orders placed or performed in visit on 05/11/16  POCT A1C  Result Value Ref Range   Hemoglobin A1C 8.3       Assessment & Plan:   Problem List Items Addressed This Visit      Cardiovascular and Mediastinum   HBP (high blood pressure)    Controlled. Continue current regimen.         Endocrine   Diabetes mellitus with neuropathy ([place]) - Primary    A1c is elevated. Start mealtime insulin today. 6 units with each meal of novolog. Pt to check blood sugars TID while on mealtime insulin. Hold if <100 prior to meal. Check BMP and UA microalbumin.  Recheck 4 weeks.       Relevant Medications   insulin aspart (NOVOLOG) 100 UNIT/ML FlexPen   Other Relevant Orders   BASIC METABOLIC PANEL WITH GFR   POCT UA - Microalbumin     Other   Hyperlipidemia associated with type 2 diabetes mellitus ([place])    Unable to tolerate Zetia. Will recheck lipid panel. Pt intolerant to statins in the past. Reviewed diet and exercise changes.       Relevant Medications   insulin aspart (NOVOLOG) 100 UNIT/ML  FlexPen   Other Relevant Orders   Lipid panel    Other Visit Diagnoses    Joint pain        Renewed PRN hydrocodone. Last fille date 11/02/2015. >6 mos between fills.     Relevant Medications    HYDROcodone-acetaminophen (NORCO/VICODIN) 5-325 MG tablet       Meds ordered this encounter  Medications  . HYDROcodone-acetaminophen (NORCO/VICODIN) 5-325 MG tablet    Sig: Take 1 tablet by mouth as needed.    Dispense:  30 tablet    Refill:  0    Order Specific Question:  Supervising Provider    Answer:  Arlis Porta [phone]  . insulin aspart (NOVOLOG) 100 UNIT/ML FlexPen    Sig: Inject 6 Units into the  skin 3 (three) times daily with meals.    Dispense:  15 mL    Refill:  11    Order Specific Question:  Supervising Provider    Answer:  Arlis Porta [phone]      Follow up plan: Return in about 4 weeks (around 06/14/2016) for sugar check. Marland Kitchen

## 2016-05-17 NOTE — Assessment & Plan Note (Signed)
Unable to tolerate Zetia. Will recheck lipid panel. Pt intolerant to statins in the past. Reviewed diet and exercise changes.

## 2016-05-18 LAB — LIPID PANEL
CHOL/HDL RATIO: 5.3 ratio — AB (ref <=5.0)
CHOLESTEROL: 165 mg/dL (ref 125–200)
HDL: 31 mg/dL — ABNORMAL LOW (ref >=40)
LDL Cholesterol: 95 mg/dL (ref <130)
Triglycerides: 195 mg/dL — ABNORMAL HIGH (ref <150)
VLDL: 39 mg/dL — ABNORMAL HIGH (ref <30)

## 2016-05-18 LAB — BASIC METABOLIC PANEL WITH GFR
BUN: 16 mg/dL (ref 7–25)
CALCIUM: 8.8 mg/dL (ref 8.6–10.3)
CO2: 22 mmol/L (ref 20–31)
Chloride: 103 mmol/L (ref 98–110)
Creat: 1.03 mg/dL (ref 0.70–1.25)
GFR, EST AFRICAN AMERICAN: 87 mL/min (ref >=60)
GFR, EST NON AFRICAN AMERICAN: 75 mL/min (ref >=60)
GLUCOSE: 194 mg/dL — AB (ref 65–99)
POTASSIUM: 4.6 mmol/L (ref 3.5–5.3)
Sodium: 139 mmol/L (ref 135–146)

## 2016-05-19 ENCOUNTER — Other Ambulatory Visit: Payer: Self-pay | Admitting: *Deleted

## 2016-06-16 ENCOUNTER — Ambulatory Visit (INDEPENDENT_AMBULATORY_CARE_PROVIDER_SITE_OTHER): Payer: 59 | Admitting: Family Medicine

## 2016-06-16 ENCOUNTER — Encounter: Payer: Self-pay | Admitting: Family Medicine

## 2016-06-16 VITALS — BP 143/79 | HR 77 | Temp 98.7°F | Resp 16 | Ht 70.0 in | Wt 237.0 lb

## 2016-06-16 DIAGNOSIS — I10 Essential (primary) hypertension: Secondary | ICD-10-CM

## 2016-06-16 DIAGNOSIS — R42 Dizziness and giddiness: Secondary | ICD-10-CM

## 2016-06-16 DIAGNOSIS — E1101 Type 2 diabetes mellitus with hyperosmolarity with coma: Secondary | ICD-10-CM | POA: Diagnosis not present

## 2016-06-16 DIAGNOSIS — C671 Malignant neoplasm of dome of bladder: Secondary | ICD-10-CM

## 2016-06-16 DIAGNOSIS — Z794 Long term (current) use of insulin: Secondary | ICD-10-CM | POA: Diagnosis not present

## 2016-06-16 DIAGNOSIS — R451 Restlessness and agitation: Secondary | ICD-10-CM

## 2016-06-16 MED ORDER — IMIPRAMINE HCL 10 MG PO TABS: 10.0000 mg | ORAL_TABLET | Freq: Every day | ORAL | Status: DC

## 2016-06-16 MED ORDER — FLUOXETINE HCL 40 MG PO CAPS: 40.0000 mg | ORAL_CAPSULE | Freq: Every day | ORAL | Status: DC

## 2016-06-16 NOTE — Progress Notes (Signed)
Name: Henry Horne   MRN: [id number]    DOB: 02-08-1951   Date:06/16/2016       Progress Note  Subjective  Chief Complaint  Chief Complaint  Patient presents with  . Diabetes    last A1C 8.3%    HPI Here for f/u of DM, HBP, agitation, COPD, GERD, bladder cancer.  He is not [employer] as directed ()only uses once daily instead of 3 times a day.  He is getting more agitated and asks about a higher dose of Prozac.  BPs doing well and bladder cancer state stable.  Also c/o feeling dizzy when standing.  Has to stand and rest before walking No problem-specific assessment & plan notes found for this encounter.   Past Medical History  Diagnosis Date  . Anxiety   . Depression   . Hypertension   . Hyperlipidemia   . Joint pain   . Emphysema of lung ([place]) 1996  . Prostate cancer ([place]) 2008    removed with cryotherapy  . Cancer of kidney ([place]) 2010    cystoscopy 2010, 2014, 2015, 2016- MD q 6 months    Past Surgical History  Procedure Laterality Date  . Digit nail removal Left 2016    left and right foot -toe nails    Family History  Problem Relation Age of Onset  . Diabetes Mother   . Heart disease Father   . Cancer Father   . Hyperlipidemia Father   . Hypertension Father   . Diabetes Brother   . COPD Brother   . Autoimmune disease Brother   . Heart disease Brother   . Diabetes Brother     Social History   Social History  . Marital Status: Married    Spouse Name: Butch Penny  . Number of Children: N/A  . Years of Education: N/A   Occupational History  . Not on file.   Social History Main Topics  . Smoking status: Former Smoker -- 30 years    Quit date: 05/18/2014  . Smokeless tobacco: Never Used  . Alcohol Use: No  . Drug Use: No  . Sexual Activity: Not on file   Other Topics Concern  . Not on file   Social History Narrative     Current outpatient prescriptions:  .  albuterol (PROAIR HFA) 108 (90 BASE) MCG/ACT inhaler, as needed., Disp: , Rfl:  .   amLODipine (NORVASC) 5 MG tablet, Take 1 tablet (5 mg total) by mouth daily., Disp: 90 tablet, Rfl: 3 .  benazepril (LOTENSIN) 40 MG tablet, TAKE 1 TABLET BY MOUTH ONCE DAILY, Disp: 90 tablet, Rfl: 3 .  bismuth subsalicylate (PEPTO-BISMOL) 262 MG chewable tablet, Chew 2 tablets (524 mg total) by mouth as needed., Disp: 30 tablet, Rfl: 0 .  esomeprazole (NEXIUM) 40 MG capsule, Take 40 mg by mouth every other day., Disp: , Rfl:  .  FLUoxetine (PROZAC) 40 MG capsule, Take 1 capsule (40 mg total) by mouth daily., Disp: 90 capsule, Rfl: 3 .  Fluticasone-Salmeterol (ADVAIR DISKUS) 250-50 MCG/DOSE AEPB, as needed., Disp: , Rfl:  .  Garlic Oil 2 MG CAPS, Take 1,000 mg by mouth daily. Reported on 11/17/2015, Disp: , Rfl:  .  HYDROcodone-acetaminophen (NORCO/VICODIN) 5-325 MG tablet, Take 1 tablet by mouth as needed., Disp: 30 tablet, Rfl: 0 .  ibuprofen (ADVIL,MOTRIN) 800 MG tablet, TAKE 1 TABLET BY MOUTH 3 TIMES DAILY AS NEEDED, Disp: 270 tablet, Rfl: 3 .  imipramine (TOFRANIL) 10 MG tablet, Take 1 tablet (10 mg total)  by mouth at bedtime., Disp: 90 tablet, Rfl: 3 .  insulin aspart (NOVOLOG) 100 UNIT/ML FlexPen, Inject 6 Units into the skin 3 (three) times daily with meals., Disp: 15 mL, Rfl: 11 .  LANTUS SOLOSTAR 100 UNIT/ML Solostar Pen, INJECT 56 UNITS SUBCUTANEOUSLY DAILY (Patient taking differently: INJECT 64 UNITS SUBCUTANEOUSLY DAILY), Disp: 15 mL, Rfl: 12 .  metFORMIN (GLUCOPHAGE) 1000 MG tablet, TAKE 1 TABLET BY MOUTH TWICE DAILY, Disp: 60 tablet, Rfl: 12 .  NOVOFINE 30G X 8 MM MISC, USE DAILY WITH LANTUS AND VICTOZA, Disp: 300 each, Rfl: 12 .  TRUEPLUS LANCETS 30G MISC, USE AS DIRECTED 3 TIMES A DAY, Disp: 300 each, Rfl: 1 .  TRUETEST TEST test strip, USE AS DIRECTED 3 TIMES A DAY, Disp: 300 each, Rfl: 1 .  VICTOZA 18 MG/3ML SOPN, INJECT 1.8MG  SUBCUTANEOUSLY IN THE MORNING, Disp: 27 mL, Rfl: 10  Allergies  Allergen Reactions  . Zetia [Ezetimibe] Other (See Comments)    Joint pain, congestion,  muscle ache Joint pain, congestion, muscle ache  . Pneumococcal Vaccine   . Pneumococcal Vaccines      Review of Systems  Constitutional: Negative for fever, chills, weight loss and malaise/fatigue.  HENT: Negative for hearing loss.   Eyes: Negative for blurred vision and double vision.  Respiratory: Negative for cough, shortness of breath and wheezing.   Cardiovascular: Negative for chest pain, palpitations and leg swelling.  Gastrointestinal: Negative for heartburn, abdominal pain and blood in stool.  Genitourinary: Negative for dysuria, urgency and frequency.  Musculoskeletal: Positive for back pain.  Skin: Negative for rash.  Neurological: Positive for dizziness. Negative for tremors, weakness and headaches.  Psychiatric/Behavioral: The patient is nervous/anxious (agitation).       Objective  Filed Vitals:   06/16/16 1306  BP: 143/79  Pulse: 77  Temp: 98.7 F (37.1 C)  TempSrc: Oral  Resp: 16  Height: 5\' 10"  (1.778 m)  Weight: 237 lb (107.502 kg)    Physical Exam  Constitutional: He is oriented to person, place, and time and well-developed, well-nourished, and in no distress. No distress.  HENT:  Head: Normocephalic and atraumatic.  Eyes: Conjunctivae and EOM are normal. Pupils are equal, round, and reactive to light. No scleral icterus.  Neck: Normal range of motion. Neck supple. Carotid bruit is not present. No thyromegaly present.  Cardiovascular: Normal rate, regular rhythm and normal heart sounds.  Exam reveals no gallop and no friction rub.   No murmur heard. Pulmonary/Chest: Effort normal and breath sounds normal. No respiratory distress. He has no wheezes. He has no rales.  Abdominal: Soft. Bowel sounds are normal. He exhibits no distension and no mass. There is no tenderness.  Musculoskeletal: He exhibits no edema.  Lymphadenopathy:    He has no cervical adenopathy.  Neurological: He is alert and oriented to person, place, and time.  Vitals  reviewed.      Recent Results (from the past 2160 hour(s))  POCT A1C     Status: Abnormal   Collection Time: 05/11/16 12:20 PM  Result Value Ref Range   Hemoglobin A1C 8.3   BASIC METABOLIC PANEL WITH GFR     Status: Abnormal   Collection Time: 05/17/16  9:12 AM  Result Value Ref Range   Sodium 139 135 - 146 mmol/L   Potassium 4.6 3.5 - 5.3 mmol/L   Chloride 103 98 - 110 mmol/L   CO2 22 20 - 31 mmol/L   Glucose, Bld 194 (H) 65 - 99 mg/dL   BUN 16  7 - 25 mg/dL   Creat 1.03 0.70 - 1.25 mg/dL    Comment:   For patients > or = 66 years of age: The upper reference limit for Creatinine is approximately 13% higher for people identified as African-American.      Calcium 8.8 8.6 - 10.3 mg/dL   GFR, Est African American 87 >=60 mL/min   GFR, Est Non African American 75 >=60 mL/min  Lipid panel     Status: Abnormal   Collection Time: 05/17/16  9:12 AM  Result Value Ref Range   Cholesterol 165 125 - 200 mg/dL   Triglycerides 195 (H) <150 mg/dL   HDL 31 (L) >=40 mg/dL   Total CHOL/HDL Ratio 5.3 (H) <=5.0 Ratio   VLDL 39 (H) <30 mg/dL   LDL Cholesterol 95 <130 mg/dL    Comment:   Total Cholesterol/HDL Ratio:CHD Risk                        Coronary Heart Disease Risk Table                                        Men       Women          1/2 Average Risk              3.4        3.3              Average Risk              5.0        4.4           2X Average Risk              9.6        7.1           3X Average Risk             23.4       11.0 Use the calculated Patient Ratio above and the CHD Risk table  to determine the patient's CHD Risk.   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 FLUoxetine (PROZAC) 40 MG capsule    Sig: Take 1 capsule (40 mg total) by mouth daily.    Dispense:  90 capsule    Refill:  3  . imipramine (TOFRANIL) 10 MG tablet    Sig: Take 1 tablet (10 mg total) by mouth at bedtime.    Dispense:  90 tablet    Refill:  3   1. Uncontrolled type 2 diabetes mellitus with hyperosmolar coma, with long-term current use of insulin (HCC) Cont Lantus, Victoza, Metformin. Use Novolog 2-3 times every day. 2. Essential hypertension Cont Amlodipine, and Benazepril  3. 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 Jacqlyn Larsen.  4. Agitation  - FLUoxetine (PROZAC) 40 MG capsule; Take 1 capsule (40 mg total) by mouth daily.  Dispense: 90 capsule; Refill: 3 (increased from 20 mg/d). 5. Dizziness  - imipramine (TOFRANIL) 10 MG tablet; Take 1 tablet (10 mg total) by mouth at bedtime.  Dispense: 90 tablet; Refill: 3  (decreased from 25 mg/d).

## 2016-06-22 ENCOUNTER — Other Ambulatory Visit: Payer: Self-pay

## 2016-06-22 VITALS — BP 130/70 | Ht 70.5 in | Wt 235.0 lb

## 2016-06-22 DIAGNOSIS — R739 Hyperglycemia, unspecified: Secondary | ICD-10-CM

## 2016-06-22 DIAGNOSIS — E1165 Type 2 diabetes mellitus with hyperglycemia: Secondary | ICD-10-CM

## 2016-06-22 DIAGNOSIS — Z794 Long term (current) use of insulin: Principal | ICD-10-CM

## 2016-06-22 NOTE — Patient Outreach (Signed)
[facility]) Care Management  [place]  06/22/2016   Henry Horne 12-21-1950 [id number]  Subjective: Met with Kerin Ransom today for Link to Wellness diabetes visit.  His blood sugar averages are 7 day- 243m/dl, 14 day-1837mdl and 30 day- 16779ml.  His is only checking fasting blood sugars.  He has had a number of high fasting blood sugars in the past few weeks despite starting Novolog 6 units tid with meals last week.  He remembers to take his meds most days and is keeping his insulin in a cooler when he goes to work.  He has not checked sugars 2 hours after a meal. He denies hypo or hyperglycemia.  He is not taking an aspirin a day or a statin drug.  He is taking BP medication as ordered. He is pleased with the increase in his Prozac.   Objective:  Vitals:   06/22/16 1118  BP: 130/70  Weight: 235 lb (106.6 kg)  Height: 1.791 m (5' 10.5")     Encounter Medications:  Outpatient Encounter Prescriptions as of 06/22/2016  Medication Sig Note  . albuterol (PROAIR HFA) 108 (90 BASE) MCG/ACT inhaler as needed. 06/16/2015: [facility]: [facility] . amLODipine (NORVASC) 5 MG tablet Take 1 tablet (5 mg total) by mouth daily.   . benazepril (LOTENSIN) 40 MG tablet TAKE 1 TABLET BY MOUTH ONCE DAILY   . FLUoxetine (PROZAC) 40 MG capsule Take 1 capsule (40 mg total) by mouth daily. (Patient taking differently: Take 80 mg by mouth daily. )   . Fluticasone-Salmeterol (ADVAIR DISKUS) 250-50 MCG/DOSE AEPB as needed. 06/16/2015: [place] [facility] . Garlic Oil 2 MG CAPS Take 1,000 mg by mouth daily. Reported on 11/17/2015 06/16/2015: [place] [facility],[place] mg by mouth.  . HMarland KitchenDROcodone-acetaminophen (NORCO/VICODIN) 5-325 MG tablet Take 1 tablet by mouth as needed.   . iMarland Kitchenuprofen (ADVIL,MOTRIN) 800 MG tablet TAKE 1 TABLET BY MOUTH 3 TIMES DAILY AS NEEDED   . imipramine (TOFRANIL) 10 MG tablet Take 1  tablet (10 mg total) by mouth at bedtime. (Patient taking differently: Take 5 mg by mouth at bedtime. )   . insulin aspart (NOVOLOG) 100 UNIT/ML FlexPen Inject 6 Units into the skin 3 (three) times daily with meals.   . LMarland KitchenNTUS SOLOSTAR 100 UNIT/ML Solostar Pen INJECT 56 UNITS SUBCUTANEOUSLY DAILY (Patient taking differently: INJECT 64 UNITS SUBCUTANEOUSLY DAILY)   . metFORMIN (GLUCOPHAGE) 1000 MG tablet TAKE 1 TABLET BY MOUTH TWICE DAILY   . NOVOFINE 30G X 8 MM MISC USE DAILY WITH LANTUS AND VICTOZA   . TRUEPLUS LANCETS 30G MISC USE AS DIRECTED 3 TIMES A DAY   . TRUETEST TEST test strip USE AS DIRECTED 3 TIMES A DAY   . VICTOZA 18 MG/3ML SOPN INJECT 1.8MG SUBCUTANEOUSLY IN THE MORNING   . bismuth subsalicylate (PEPTO-BISMOL) 262 MG chewable tablet Chew 2 tablets (524 mg total) by mouth as needed. (Patient not taking: Reported on 06/22/2016)   . esomeprazole (NEXIUM) 40 MG capsule Take 40 mg by mouth every other day. 06/16/2015: [place] [facility] [facility] [place] 40 mg by [facility].   No facility-administered encounter medications on file as of 06/22/2016.     Functional Status:  In your present state of health, do you have any difficulty performing the following activities: 05/11/2016 09/16/2015  Hearing? N N  Vision? N N  Difficulty concentrating or making decisions? N N  Walking or climbing stairs? N N  Dressing or bathing? Y N  Doing errands, shopping? N N  Some recent data might be hidden    Fall/Depression Screening: PHQ 2/9 Scores 06/22/2016 06/16/2016 05/11/2016 12/28/2015 11/17/2015 09/16/2015 07/29/2015  PHQ - 2 Score 0 2 0 0 0 4 0  PHQ- 9 Score - 9 - - - 7 -    Assessment: Discourgaged regarding A1C and recent blood sugar numbers.  He is receptive to ongoing diet education.  We reviewed insulin ans injectable administration.  Willing to change diet to incorporate teachin.   Plan:  [facility] CM Care Plan Problem Two   Flowsheet Row Most Recent Value  Care Plan Problem Two   A1C currently 8.3%  Role Documenting the Problem Two  Care Management Coordinator  Care Plan for Problem Two  Active  Interventions for Problem Two Long Term Goal   reviewed insulin administration, site selection, storage  2. reviewed importance of hydration since he works in no airconditioning  3. Reviewed know your carbs worksheet  4. reviewed a healthy plate worksheet 5. discussed the importance of checking and writing down CBG, 2 hours after a meal  THN Long Term Goal (31-90) days  A1C less than 8.3% when it is checked again in 3 months  THN Long Term Goal Start Date  06/22/16 [A1C from 8% up to 8.3%- continue to move towards A1C goal]     Follow up by phone in 1 month.    Gentry Fitz, RN, BA, [place], [place] Direct Dial:  [phone]  Fax:  [phone] E-mail: [email or web address] [address], [place], [place]  [address]

## 2016-07-20 ENCOUNTER — Ambulatory Visit: Payer: Self-pay

## 2016-07-21 ENCOUNTER — Other Ambulatory Visit: Payer: Self-pay | Admitting: Family Medicine

## 2016-08-03 ENCOUNTER — Other Ambulatory Visit: Payer: Self-pay

## 2016-08-03 NOTE — Patient Outreach (Signed)
[place] [facility] [facility]) Care Management  08/03/2016  RIGGS BRITNELL 10-21-1951 [id number]   Called patient for Link to Wellness follow up.  Patient was not available by phone.  I have left him a message to return my call.   Gentry Fitz, RN, BA, [place], [place] Direct Dial:  [phone]  Fax:  [phone] E-mail: [email or web address] [address] [place], [place]  [address]

## 2016-08-15 ENCOUNTER — Other Ambulatory Visit: Payer: Self-pay | Admitting: Family Medicine

## 2016-08-15 ENCOUNTER — Telehealth: Payer: Self-pay | Admitting: Family Medicine

## 2016-08-15 MED ORDER — INSULIN PEN NEEDLE 31G X 5 MM MISC: 1.0000 | Freq: Three times a day (TID) | 3 refills | Status: DC

## 2016-08-15 NOTE — Telephone Encounter (Signed)
Pt is using 5 needles daily with new insulin.  He needs prescription for a 90 day supply sent to [facility].  His call back number is [phone]

## 2016-08-15 NOTE — Telephone Encounter (Signed)
OK to send prescription for his Pen needles to his pharmacy.  Needs 450 needles at a time with 3 refills.-jh

## 2016-08-15 NOTE — Telephone Encounter (Signed)
Done

## 2016-08-18 ENCOUNTER — Ambulatory Visit: Payer: 59 | Admitting: Family Medicine

## 2016-08-23 ENCOUNTER — Encounter: Payer: Self-pay | Admitting: Family Medicine

## 2016-08-23 ENCOUNTER — Ambulatory Visit (INDEPENDENT_AMBULATORY_CARE_PROVIDER_SITE_OTHER): Payer: 59 | Admitting: Family Medicine

## 2016-08-23 VITALS — BP 120/75 | HR 81 | Temp 98.1°F | Ht 70.0 in | Wt 240.0 lb

## 2016-08-23 DIAGNOSIS — E119 Type 2 diabetes mellitus without complications: Secondary | ICD-10-CM

## 2016-08-23 DIAGNOSIS — E1121 Type 2 diabetes mellitus with diabetic nephropathy: Secondary | ICD-10-CM

## 2016-08-23 DIAGNOSIS — Z794 Long term (current) use of insulin: Secondary | ICD-10-CM

## 2016-08-23 DIAGNOSIS — IMO0002 Reserved for concepts with insufficient information to code with codable children: Secondary | ICD-10-CM

## 2016-08-23 DIAGNOSIS — E1169 Type 2 diabetes mellitus with other specified complication: Secondary | ICD-10-CM

## 2016-08-23 DIAGNOSIS — R451 Restlessness and agitation: Secondary | ICD-10-CM

## 2016-08-23 DIAGNOSIS — Z23 Encounter for immunization: Secondary | ICD-10-CM

## 2016-08-23 DIAGNOSIS — E1165 Type 2 diabetes mellitus with hyperglycemia: Secondary | ICD-10-CM | POA: Diagnosis not present

## 2016-08-23 DIAGNOSIS — E785 Hyperlipidemia, unspecified: Secondary | ICD-10-CM

## 2016-08-23 DIAGNOSIS — D090 Carcinoma in situ of bladder: Secondary | ICD-10-CM | POA: Diagnosis not present

## 2016-08-23 DIAGNOSIS — E1140 Type 2 diabetes mellitus with diabetic neuropathy, unspecified: Secondary | ICD-10-CM

## 2016-08-23 DIAGNOSIS — I10 Essential (primary) hypertension: Secondary | ICD-10-CM

## 2016-08-23 LAB — POCT GLYCOSYLATED HEMOGLOBIN (HGB A1C): Hemoglobin A1C: 7.6

## 2016-08-23 MED ORDER — INSULIN ASPART 100 UNIT/ML FLEXPEN: 10.0000 [IU] | PEN_INJECTOR | Freq: Three times a day (TID) | SUBCUTANEOUS | 11 refills | Status: DC

## 2016-08-23 NOTE — Progress Notes (Signed)
Name: Henry Horne   MRN: [id number]    DOB: 01/27/1951   Date:08/23/2016       Progress Note  Subjective  Chief Complaint  Chief Complaint  Patient presents with  . Diabetes  . Hypertension    HPI Here for f/u of DM and HBP and COPD.  Wheezes in the evening still, but only using Advair once daily.  BSs run from 140-220.  Uses Novolog twice daily only usually.  Depression doing well on Prozac.   No problem-specific Assessment & Plan notes found for this encounter.   Past Medical History:  Diagnosis Date  . Anxiety   . Cancer of kidney ([place]) 2010   cystoscopy 2010, 2014, 2015, 2016- MD q 6 months  . Depression   . Emphysema of lung ([place]) 1996  . Hyperlipidemia   . Hypertension   . Joint pain   . Prostate cancer [facility]) 2008   removed with cryotherapy    Past Surgical History:  Procedure Laterality Date  . DIGIT NAIL REMOVAL Left 2016   left and right foot -toe nails    Family History  Problem Relation Age of Onset  . Diabetes Mother   . Heart disease Father   . Cancer Father   . Hyperlipidemia Father   . Hypertension Father   . Diabetes Brother   . COPD Brother   . Autoimmune disease Brother   . Heart disease Brother   . Diabetes Brother     Social History   Social History  . Marital status: Married    Spouse name: Butch Penny  . Number of children: N/A  . Years of education: N/A   Occupational History  . Not on file.   Social History Main Topics  . Smoking status: Former Smoker    Years: 30.00    Quit date: 05/18/2014  . Smokeless tobacco: Never Used  . Alcohol use No  . Drug use: No  . Sexual activity: Not on file   Other Topics Concern  . Not on file   Social History Narrative  . No narrative on file     Current Outpatient Prescriptions:  .  amLODipine (NORVASC) 5 MG tablet, Take 1 tablet (5 mg total) by mouth daily., Disp: 90 tablet, Rfl: 3 .  benazepril (LOTENSIN) 40 MG tablet, TAKE 1 TABLET BY MOUTH ONCE DAILY, Disp: 90 tablet, Rfl:  3 .  bismuth subsalicylate (PEPTO-BISMOL) 262 MG chewable tablet, Chew 2 tablets (524 mg total) by mouth as needed., Disp: 30 tablet, Rfl: 0 .  esomeprazole (NEXIUM) 40 MG capsule, Take 40 mg by mouth every other day., Disp: , Rfl:  .  FLUoxetine (PROZAC) 40 MG capsule, Take 1 capsule (40 mg total) by mouth daily. (Patient taking differently: Take 80 mg by mouth daily. ), Disp: 90 capsule, Rfl: 3 .  Fluticasone-Salmeterol (ADVAIR DISKUS) 250-50 MCG/DOSE AEPB, Inhale 1 puff into the lungs every morning. , Disp: , Rfl:  .  Garlic Oil 2 MG CAPS, Take 1,000 mg by mouth daily. Reported on 11/17/2015, Disp: , Rfl:  .  HYDROcodone-acetaminophen (NORCO/VICODIN) 5-325 MG tablet, Take 1 tablet by mouth as needed., Disp: 30 tablet, Rfl: 0 .  ibuprofen (ADVIL,MOTRIN) 800 MG tablet, TAKE 1 TABLET BY MOUTH 3 TIMES DAILY AS NEEDED, Disp: 270 tablet, Rfl: 3 .  imipramine (TOFRANIL) 10 MG tablet, Take 1 tablet (10 mg total) by mouth at bedtime. (Patient taking differently: Take 5 mg by mouth at bedtime. ), Disp: 90 tablet, Rfl: 3 .  insulin  aspart (NOVOLOG) 100 UNIT/ML FlexPen, Inject 10 Units into the skin 3 (three) times daily with meals., Disp: 15 mL, Rfl: 11 .  Insulin Pen Needle 31G X 5 MM MISC, 1 each by Does not apply route 3 (three) times daily., Disp: 450 each, Rfl: 3 .  LANTUS SOLOSTAR 100 UNIT/ML Solostar Pen, INJECT 56 UNITS SUBCUTANEOUSLY DAILY (Patient taking differently: INJECT 64 UNITS SUBCUTANEOUSLY DAILY), Disp: 15 mL, Rfl: 12 .  metFORMIN (GLUCOPHAGE) 1000 MG tablet, TAKE 1 TABLET BY MOUTH TWICE DAILY, Disp: 60 tablet, Rfl: 12 .  NOVOFINE 30G X 8 MM MISC, USE DAILY WITH LANTUS AND VICTOZA, Disp: 300 each, Rfl: 12 .  PROAIR HFA 108 (90 Base) MCG/ACT inhaler, INHALE 2 PUFFS BY MOUTH FOUR TIMES DAILY AS NEEDED, Disp: 8.5 g, Rfl: 12 .  TRUEPLUS LANCETS 30G MISC, USE AS DIRECTED 3 TIMES A DAY, Disp: 300 each, Rfl: 1 .  TRUETEST TEST test strip, USE AS DIRECTED 3 TIMES A DAY, Disp: 300 each, Rfl: 1 .   VICTOZA 18 MG/3ML SOPN, INJECT 1.8MG  SUBCUTANEOUSLY IN THE MORNING, Disp: 27 mL, Rfl: 10  Allergies  Allergen Reactions  . Zetia [Ezetimibe] Other (See Comments)    Joint pain, congestion, muscle ache Joint pain, congestion, muscle ache  . Pneumococcal Vaccine   . Pneumococcal Vaccines      Review of Systems  Constitutional: Negative for chills, fever, malaise/fatigue and weight loss.  HENT: Negative for hearing loss.   Eyes: Negative for blurred vision and double vision.  Respiratory: Positive for shortness of breath and wheezing. Negative for cough and sputum production.   Cardiovascular: Negative for chest pain, palpitations and leg swelling.  Gastrointestinal: Negative for abdominal pain, blood in stool, constipation, diarrhea, nausea and vomiting.  Genitourinary: Negative for dysuria, frequency and urgency.  Musculoskeletal: Negative for joint pain and myalgias.  Skin: Negative for rash.  Neurological: Negative for dizziness, tremors, weakness and headaches.      Objective  Vitals:   08/23/16 1049 08/23/16 1128  BP: 127/74 120/75  Pulse: 81   Temp: 98.1 F (36.7 C)   TempSrc: Oral   Weight: 108.9 kg (240 lb)   Height: 5\' 10"  (1.778 m)     Physical Exam  Constitutional: He is oriented to person, place, and time and well-developed, well-nourished, and in no distress. No distress.  HENT:  Head: Normocephalic and atraumatic.  Eyes: Conjunctivae and EOM are normal. Pupils are equal, round, and reactive to light. No scleral icterus.  Neck: Normal range of motion. Neck supple. Carotid bruit is not present. No thyromegaly present.  Cardiovascular: Normal rate, regular rhythm and normal heart sounds.  Exam reveals no gallop and no friction rub.   No murmur heard. Pulmonary/Chest: Effort normal and breath sounds normal. No respiratory distress. He has no wheezes. He has no rales.  Abdominal: Soft. Bowel sounds are normal. He exhibits no distension and no mass.   Musculoskeletal: He exhibits no edema.  Lymphadenopathy:    He has no cervical adenopathy.  Neurological: He is alert and oriented to person, place, and time.  Psychiatric: Mood, memory, affect and judgment normal.  Vitals reviewed.      Recent Results (from the past 2160 hour(s))  POCT HgB A1C     Status: Abnormal   Collection Time: 08/23/16 11:09 AM  Result Value Ref Range   Hemoglobin A1C 7.6%      Assessment & Plan  Problem List Items Addressed This Visit      Cardiovascular and Mediastinum  HBP (high blood pressure)     Endocrine   Diabetes mellitus with neuropathy (HCC)   Relevant Medications   insulin aspart (NOVOLOG) 100 UNIT/ML FlexPen   Uncontrolled type 2 diabetes mellitus, with long-term current use of insulin (HCC)   Relevant Medications   insulin aspart (NOVOLOG) 100 UNIT/ML FlexPen     Genitourinary   Bladder CA in situ     Other   Hyperlipidemia associated with type 2 diabetes mellitus (HCC)   Relevant Medications   insulin aspart (NOVOLOG) 100 UNIT/ML FlexPen   Agitation    Other Visit Diagnoses    Insulin-requiring or dependent type II diabetes mellitus ([place])    -  Primary   Relevant Medications   insulin aspart (NOVOLOG) 100 UNIT/ML FlexPen   Other Relevant Orders   POCT HgB A1C (Completed)   Immunization due       Relevant Orders   Flu vaccine HIGH DOSE PF (Fluzone High Dose)      Meds ordered this encounter  Medications  . insulin aspart (NOVOLOG) 100 UNIT/ML FlexPen    Sig: Inject 10 Units into the skin 3 (three) times daily with meals.    Dispense:  15 mL    Refill:  11   1. Insulin-requiring or dependent type II diabetes mellitus ([place])  - POCT HgB A1C-7.6  2. Uncontrolled type 2 diabetes mellitus with diabetic nephropathy, with long-term current use of insulin ([place])   3. Essential hypertension Cont meds  4. Bladder CA in situ   5. Agitation Cont Prozac  6. Hyperlipidemia associated with type 2 diabetes mellitus  ([place]) cont meds  7. Type 2 diabetes mellitus with diabetic neuropathy, with long-term current use of insulin (HCC)  - insulin aspart (NOVOLOG) 100 UNIT/ML FlexPen; Inject 10 Units into the skin 3 (three) times daily with meals.  Dispense: 15 mL; Refill: 11 contg Lantus and Victoza 8. Immunization due  - Flu vaccine HIGH DOSE PF (Fluzone High Dose)

## 2016-08-24 ENCOUNTER — Other Ambulatory Visit: Payer: Self-pay

## 2016-08-24 NOTE — Patient Outreach (Signed)
[facility]) Care Management  08/24/2016  DONA KLEMANN 1951/11/02 [id number]  Spoke with patient by phone today (he left me an e-mail yesterday with A1C of 7.6%).  Patient reveals "I have no idea how that happened".  Patient reports blood sugars pre breakfast and at hs are 149-279m/dl, very occasionally >200 (about 2262mdl).  Because of his work schedule, he usually only takes insulin 2 times a day with meals vs. 3 x /day as ordered.  He often times does not eat supper because he gets home late and only eats a snack at that time.  He reports that MD has increased his Novolog to 10 units tid with meals- he continues to take all other medications as ordered without any changes. He is not having any medication side effects.  Takes Victoza qam.   He admits to having a decreased appetite, generally eats toast with peanut butter at breakfast and a biscuit with peanut butter at work with a Coke Zero or [employer]   [facility] Care Plan Problem Two   Flowsheet Row Most Recent Value  Care Plan Problem Two  A1C currently 8.3%  Role Documenting the Problem Two  Care Management Coordinator  Care Plan for Problem Two  Active  Interventions for Problem Two Long Term Goal   Discussed the importance of checking and writing down CBG, 2 hours after a meal 2. discussed the importance of having glucose with him at all times in case of low blood sugar and the importance of   THN Long Term Goal (31-90) days  A1C less than 8.3% when it is checked again in 3 months  THN Long Term Goal Start Date  08/24/16 [A1C from 8% up to 8.3%- continue to move towards A1C goal]  THN Long Term Goal Met Date  -- [A1C 7.6 5 on 08/23/16]     Will take Novolog 10 units tid pre-meals when he eats three meals.  Will carry glucose tabs or candy with him at all times.    JuGentry FitzRN, BA, MHWhite PlainsCDMayerirect Dial:  [phone]:   [phone]-mail: [email or web address] [address] AvenueBuBridgeport[place]  [id number]

## 2016-09-19 ENCOUNTER — Other Ambulatory Visit: Payer: Self-pay | Admitting: Family Medicine

## 2016-09-26 ENCOUNTER — Other Ambulatory Visit: Payer: Self-pay

## 2016-09-26 NOTE — Patient Outreach (Signed)
Henry Horne) Care Management  09/26/2016  Henry Horne [birth year] [id number]   Henry Horne has informed me that his wife is no longer working for [employer], making him ineligible for the Link to [employer].  He has obtained health insurance through [employer].   Discharge letter sent to patient and his MD.   Henry Fitz, RN, BA, Henry Horne, Henry Horne:  [phone]  Fax:  [phone] E-mail: [email or web address] [address], [place], [place]  [address]

## 2016-09-26 NOTE — Patient Outreach (Signed)
[facility]) Care Management  09/26/2016  AZARIAS HOWSE December 01, 1950 [id number]   Unable to reach patient by phone.  I have sent him an e-mail to update me on his current blood sugars.   Gentry Fitz, RN, BA, [place], [place] Direct Dial:  [phone]  Fax:  [phone] E-mail: [email or web address] [address], [place], [place]  [address]

## 2016-09-30 ENCOUNTER — Telehealth: Payer: Self-pay | Admitting: Family Medicine

## 2016-09-30 NOTE — Telephone Encounter (Signed)
Pt stopped by with a list of medications and requested 90 day prescriptions to be be picked up and take them to [facility]  In [place].  He lost his insurance and is shopping around for prices.  He asked for lantus 100u vial, victoza 18mg /60ml, amlodipine 5mg , metformin 1000mg , fluoxetine 40mg , benazepril 40mg , ibuprofen 800 mg, novolog flex humalog, hydrocodone 5-325 mg,imipramine 10mg , needles for pens, proair inhaler 80mcg, advair inhaler 250/50.  His call back number is [phone]

## 2016-10-03 ENCOUNTER — Other Ambulatory Visit: Payer: Self-pay | Admitting: Family Medicine

## 2016-10-03 DIAGNOSIS — R451 Restlessness and agitation: Secondary | ICD-10-CM

## 2016-10-03 DIAGNOSIS — Z794 Long term (current) use of insulin: Secondary | ICD-10-CM

## 2016-10-03 DIAGNOSIS — R42 Dizziness and giddiness: Secondary | ICD-10-CM

## 2016-10-03 DIAGNOSIS — E1140 Type 2 diabetes mellitus with diabetic neuropathy, unspecified: Secondary | ICD-10-CM

## 2016-10-03 DIAGNOSIS — M2550 Pain in unspecified joint: Secondary | ICD-10-CM

## 2016-10-03 MED ORDER — BENAZEPRIL HCL 40 MG PO TABS: 40.0000 mg | ORAL_TABLET | Freq: Every day | ORAL | 12 refills | Status: DC

## 2016-10-03 MED ORDER — AMLODIPINE BESYLATE 5 MG PO TABS: 5.0000 mg | ORAL_TABLET | Freq: Every day | ORAL | 12 refills | Status: DC

## 2016-10-03 MED ORDER — LIRAGLUTIDE 18 MG/3ML ~~LOC~~ SOPN: 1.8000 mg | PEN_INJECTOR | Freq: Every morning | SUBCUTANEOUS | 12 refills | Status: DC

## 2016-10-03 MED ORDER — TRUEPLUS LANCETS 30G MISC: 12 refills | Status: DC

## 2016-10-03 MED ORDER — HYDROCODONE-ACETAMINOPHEN 5-325 MG PO TABS: 1.0000 | ORAL_TABLET | Freq: Four times a day (QID) | ORAL | 0 refills | Status: DC | PRN

## 2016-10-03 MED ORDER — ALBUTEROL SULFATE HFA 108 (90 BASE) MCG/ACT IN AERS: 1.0000 | INHALATION_SPRAY | RESPIRATORY_TRACT | 12 refills | Status: DC | PRN

## 2016-10-03 MED ORDER — ESOMEPRAZOLE MAGNESIUM 40 MG PO CPDR: 40.0000 mg | DELAYED_RELEASE_CAPSULE | ORAL | 12 refills | Status: DC

## 2016-10-03 MED ORDER — IBUPROFEN 800 MG PO TABS: 800.0000 mg | ORAL_TABLET | Freq: Three times a day (TID) | ORAL | 12 refills | Status: DC | PRN

## 2016-10-03 MED ORDER — IMIPRAMINE HCL 10 MG PO TABS: 5.0000 mg | ORAL_TABLET | Freq: Every day | ORAL | 12 refills | Status: DC

## 2016-10-03 MED ORDER — INSULIN ASPART 100 UNIT/ML ~~LOC~~ SOLN: 10.0000 [IU] | Freq: Three times a day (TID) | SUBCUTANEOUS | 12 refills | Status: DC

## 2016-10-03 MED ORDER — METFORMIN HCL 1000 MG PO TABS: 1000.0000 mg | ORAL_TABLET | Freq: Two times a day (BID) | ORAL | 12 refills | Status: DC

## 2016-10-03 MED ORDER — INSULIN PEN NEEDLE 31G X 5 MM MISC: 1.0000 | Freq: Three times a day (TID) | 3 refills | Status: DC

## 2016-10-03 MED ORDER — GLUCOSE BLOOD VI STRP: 1.0000 | ORAL_STRIP | Freq: Two times a day (BID) | 12 refills | Status: DC

## 2016-10-03 MED ORDER — FLUTICASONE-SALMETEROL 250-50 MCG/DOSE IN AEPB: 1.0000 | INHALATION_SPRAY | Freq: Two times a day (BID) | RESPIRATORY_TRACT | 12 refills | Status: DC

## 2016-10-03 MED ORDER — FLUOXETINE HCL 40 MG PO CAPS: 40.0000 mg | ORAL_CAPSULE | Freq: Every day | ORAL | 12 refills | Status: DC

## 2016-10-03 MED ORDER — INSULIN PEN NEEDLE 30G X 8 MM MISC: 12 refills | Status: DC

## 2016-10-03 MED ORDER — INSULIN GLARGINE 100 UNIT/ML ~~LOC~~ SOLN: 64.0000 [IU] | Freq: Every day | SUBCUTANEOUS | 11 refills | Status: DC

## 2016-10-03 NOTE — Telephone Encounter (Signed)
[id number]

## 2016-10-11 ENCOUNTER — Other Ambulatory Visit: Payer: Self-pay | Admitting: Family Medicine

## 2016-10-11 MED ORDER — PANTOPRAZOLE SODIUM 40 MG PO TBEC: 40.0000 mg | DELAYED_RELEASE_TABLET | Freq: Every day | ORAL | 12 refills | Status: DC

## 2016-10-11 MED ORDER — INSULIN LISPRO 100 UNIT/ML ~~LOC~~ SOLN: 10.0000 [IU] | Freq: Three times a day (TID) | SUBCUTANEOUS | 11 refills | Status: DC

## 2016-10-11 MED ORDER — EXENATIDE ER 2 MG ~~LOC~~ PEN: 2.0000 mg | PEN_INJECTOR | SUBCUTANEOUS | 12 refills | Status: DC

## 2016-12-29 ENCOUNTER — Ambulatory Visit: Payer: 59 | Admitting: Family Medicine

## 2016-12-29 ENCOUNTER — Encounter: Payer: Self-pay | Admitting: *Deleted

## 2016-12-30 ENCOUNTER — Ambulatory Visit (INDEPENDENT_AMBULATORY_CARE_PROVIDER_SITE_OTHER): Payer: BLUE CROSS/BLUE SHIELD | Admitting: Family Medicine

## 2016-12-30 ENCOUNTER — Encounter: Payer: Self-pay | Admitting: Family Medicine

## 2016-12-30 VITALS — BP 127/63 | HR 77 | Temp 98.5°F | Resp 16 | Ht 70.0 in | Wt 246.0 lb

## 2016-12-30 DIAGNOSIS — I10 Essential (primary) hypertension: Secondary | ICD-10-CM

## 2016-12-30 DIAGNOSIS — T464X5A Adverse effect of angiotensin-converting-enzyme inhibitors, initial encounter: Secondary | ICD-10-CM | POA: Insufficient documentation

## 2016-12-30 DIAGNOSIS — E785 Hyperlipidemia, unspecified: Secondary | ICD-10-CM | POA: Diagnosis not present

## 2016-12-30 DIAGNOSIS — E1121 Type 2 diabetes mellitus with diabetic nephropathy: Secondary | ICD-10-CM | POA: Diagnosis not present

## 2016-12-30 DIAGNOSIS — E1165 Type 2 diabetes mellitus with hyperglycemia: Secondary | ICD-10-CM

## 2016-12-30 DIAGNOSIS — R451 Restlessness and agitation: Secondary | ICD-10-CM

## 2016-12-30 DIAGNOSIS — J4520 Mild intermittent asthma, uncomplicated: Secondary | ICD-10-CM | POA: Diagnosis not present

## 2016-12-30 DIAGNOSIS — C679 Malignant neoplasm of bladder, unspecified: Secondary | ICD-10-CM

## 2016-12-30 DIAGNOSIS — E1169 Type 2 diabetes mellitus with other specified complication: Secondary | ICD-10-CM | POA: Diagnosis not present

## 2016-12-30 DIAGNOSIS — Z794 Long term (current) use of insulin: Secondary | ICD-10-CM | POA: Diagnosis not present

## 2016-12-30 DIAGNOSIS — J45909 Unspecified asthma, uncomplicated: Secondary | ICD-10-CM | POA: Insufficient documentation

## 2016-12-30 DIAGNOSIS — R05 Cough: Secondary | ICD-10-CM | POA: Diagnosis not present

## 2016-12-30 DIAGNOSIS — IMO0002 Reserved for concepts with insufficient information to code with codable children: Secondary | ICD-10-CM

## 2016-12-30 LAB — COMPLETE METABOLIC PANEL WITH GFR
ALBUMIN: 4.2 g/dL (ref 3.6–5.1)
ALK PHOS: 57 U/L (ref 40–115)
ALT: 32 U/L (ref 9–46)
AST: 21 U/L (ref 10–35)
BUN: 19 mg/dL (ref 7–25)
CO2: 27 mmol/L (ref 20–31)
Calcium: 9.1 mg/dL (ref 8.6–10.3)
Chloride: 102 mmol/L (ref 98–110)
Creat: 1 mg/dL (ref 0.70–1.25)
GFR, Est African American: >89 mL/min (ref >=60)
GFR, Est Non African American: 78 mL/min (ref >=60)
Glucose, Bld: 197 mg/dL — ABNORMAL HIGH (ref 65–99)
POTASSIUM: 4.6 mmol/L (ref 3.5–5.3)
Sodium: 136 mmol/L (ref 135–146)
Total Bilirubin: 0.5 mg/dL (ref 0.2–1.2)
Total Protein: 6.7 g/dL (ref 6.1–8.1)

## 2016-12-30 LAB — LIPID PANEL
CHOL/HDL RATIO: 6.7 ratio — AB (ref <5.0)
Cholesterol: 201 mg/dL — ABNORMAL HIGH (ref <200)
HDL: 30 mg/dL — ABNORMAL LOW (ref >40)
LDL CALC: 142 mg/dL — AB (ref <100)
Triglycerides: 144 mg/dL (ref <150)
VLDL: 29 mg/dL (ref <30)

## 2016-12-30 LAB — CBC WITH DIFFERENTIAL/PLATELET
BASOS ABS: 74 {cells}/uL (ref 0–200)
Basophils Relative: 1 %
EOS PCT: 7 %
Eosinophils Absolute: 518 cells/uL — ABNORMAL HIGH (ref 15–500)
HEMATOCRIT: 46.1 % (ref 38.5–50.0)
HEMOGLOBIN: 15.3 g/dL (ref 13.2–17.1)
LYMPHS ABS: 1628 {cells}/uL (ref 850–3900)
Lymphocytes Relative: 22 %
MCH: 28 pg (ref 27.0–33.0)
MCHC: 33.2 g/dL (ref 32.0–36.0)
MCV: 84.4 fL (ref 80.0–100.0)
MONO ABS: 740 {cells}/uL (ref 200–950)
MPV: 9.9 fL (ref 7.5–12.5)
Monocytes Relative: 10 %
NEUTROS PCT: 60 %
Neutro Abs: 4440 cells/uL (ref 1500–7800)
Platelets: 231 10*3/uL (ref 140–400)
RBC: 5.46 MIL/uL (ref 4.20–5.80)
RDW: 13.2 % (ref 11.0–15.0)
WBC: 7.4 10*3/uL (ref 3.8–10.8)

## 2016-12-30 LAB — HEMOGLOBIN A1C
Hgb A1c MFr Bld: 9.1 % — ABNORMAL HIGH (ref <5.7)
MEAN PLASMA GLUCOSE: 214 mg/dL

## 2016-12-30 MED ORDER — LOSARTAN POTASSIUM 50 MG PO TABS: 50.0000 mg | ORAL_TABLET | Freq: Every day | ORAL | 3 refills | Status: DC

## 2016-12-30 NOTE — Progress Notes (Signed)
Name: Henry Horne   MRN: [id number]    DOB: 12-01-1950   Date:12/30/2016       Progress Note  Subjective  Chief Complaint  Chief Complaint  Patient presents with  . Diabetes    HPI Here for f/u of DM.  HE does not use Humalog regularly.  Only uses in AM.  Still on Lantus, 64 units nightly. Bottom of feet feel a little numb, but no stinging pains. C/o a lingering  worsening constant cough.  It is dry and tickly in throat.  He is on an ACE.  He is also wheezing daily.  Only using Advair once daily and Albuterol about twice a day.  No problem-specific Assessment & Plan notes found for this encounter.   Past Medical History:  Diagnosis Date  . Anxiety   . Cancer of kidney ([place]) 2010   cystoscopy 2010, 2014, 2015, 2016- MD q 6 months  . Depression   . Emphysema of lung ([place]) 1996  . Hyperlipidemia   . Hypertension   . Joint pain   . Prostate cancer [facility]) 2008   removed with cryotherapy    Past Surgical History:  Procedure Laterality Date  . DIGIT NAIL REMOVAL Left 2016   left and right foot -toe nails    Family History  Problem Relation Age of Onset  . Diabetes Mother   . Heart disease Father   . Cancer Father   . Hyperlipidemia Father   . Hypertension Father   . Diabetes Brother   . COPD Brother   . Autoimmune disease Brother   . Heart disease Brother   . Diabetes Brother     Social History   Social History  . Marital status: Married    Spouse name: Butch Penny  . Number of children: N/A  . Years of education: N/A   Occupational History  . Not on file.   Social History Main Topics  . Smoking status: Former Smoker    Years: 30.00    Quit date: 05/18/2014  . Smokeless tobacco: Never Used  . Alcohol use No  . Drug use: No  . Sexual activity: Not on file   Other Topics Concern  . Not on file   Social History Narrative  . No narrative on file     Current Outpatient Prescriptions:  .  albuterol (PROAIR HFA) 108 (90 Base) MCG/ACT inhaler, Inhale 1-2  puffs into the lungs every 4 (four) hours as needed for wheezing or shortness of breath., Disp: 8.5 g, Rfl: 12 .  amLODipine (NORVASC) 5 MG tablet, Take 1 tablet (5 mg total) by mouth daily., Disp: 30 tablet, Rfl: 12 .  bismuth subsalicylate (PEPTO-BISMOL) 262 MG chewable tablet, Chew 2 tablets (524 mg total) by mouth as needed., Disp: 30 tablet, Rfl: 0 .  Exenatide ER (BYDUREON) 2 MG PEN, Inject 2 mg into the skin once a week., Disp: 4 each, Rfl: 12 .  FLUoxetine (PROZAC) 40 MG capsule, Take 1 capsule (40 mg total) by mouth daily., Disp: 30 capsule, Rfl: 12 .  Fluticasone-Salmeterol (ADVAIR DISKUS) 250-50 MCG/DOSE AEPB, Inhale 1 puff into the lungs 2 (two) times daily., Disp: 1 each, Rfl: 12 .  Garlic Oil 2 MG CAPS, Take 1,000 mg by mouth 2 (two) times daily. Reported on 11/17/2015, Disp: , Rfl:  .  glucose blood (TRUETEST TEST) test strip, 1 each by Other route 2 (two) times daily. Use as instructed, Disp: 100 each, Rfl: 12 .  HYDROcodone-acetaminophen (NORCO/VICODIN) 5-325 MG tablet, Take 1  tablet by mouth every 6 (six) hours as needed for moderate pain., Disp: 60 tablet, Rfl: 0 .  ibuprofen (ADVIL,MOTRIN) 800 MG tablet, Take 1 tablet (800 mg total) by mouth 3 (three) times daily as needed., Disp: 90 tablet, Rfl: 12 .  imipramine (TOFRANIL) 10 MG tablet, Take 0.5 tablets (5 mg total) by mouth at bedtime., Disp: 30 tablet, Rfl: 12 .  insulin glargine (LANTUS) 100 UNIT/ML injection, Inject 0.64 mLs (64 Units total) into the skin at bedtime., Disp: 10 mL, Rfl: 11 .  insulin lispro (HUMALOG) 100 UNIT/ML injection, Inject 0.1 mLs (10 Units total) into the skin 3 (three) times daily before meals., Disp: 10 mL, Rfl: 11 .  Insulin Pen Needle (NOVOFINE) 30G X 8 MM MISC, Use 1 nweedle to inject Victoza once daily., Disp: 100 each, Rfl: 12 .  Insulin Pen Needle 31G X 5 MM MISC, 1 each by Does not apply route 3 (three) times daily., Disp: 450 each, Rfl: 3 .  metFORMIN (GLUCOPHAGE) 1000 MG tablet, Take 1 tablet  (1,000 mg total) by mouth 2 (two) times daily., Disp: 60 tablet, Rfl: 12 .  TRUEPLUS LANCETS 30G MISC, Use 1 lancet  to check Blood sugar twice daily, Disp: 100 each, Rfl: 12  Allergies  Allergen Reactions  . Zetia [Ezetimibe] Other (See Comments)    Joint pain, congestion, muscle ache Joint pain, congestion, muscle ache  . Pneumococcal Vaccine   . Pneumococcal Vaccines      Review of Systems  Constitutional: Negative for chills, fever, malaise/fatigue and weight loss.  HENT: Negative for hearing loss and tinnitus.   Eyes: Negative for blurred vision and double vision.  Respiratory: Negative for cough, shortness of breath and wheezing.   Cardiovascular: Negative for chest pain, palpitations and leg swelling.  Gastrointestinal: Negative for abdominal pain, blood in stool and heartburn.  Genitourinary: Negative for dysuria, frequency and urgency.  Musculoskeletal: Negative for joint pain and myalgias.  Skin: Negative for rash.  Neurological: Negative for dizziness, tingling, tremors, weakness and headaches.  Psychiatric/Behavioral: Negative for depression. The patient is not nervous/anxious and does not have insomnia.       Objective  Vitals:   12/30/16 0810  BP: 127/63  Pulse: 77  Resp: 16  Temp: 98.5 F (36.9 C)  TempSrc: Oral  Weight: 246 lb (111.6 kg)  Height: 5\' 10"  (1.778 m)    Physical Exam  Constitutional: He is oriented to person, place, and time and well-developed, well-nourished, and in no distress. No distress.  HENT:  Head: Normocephalic and atraumatic.  Eyes: Conjunctivae and EOM are normal. Pupils are equal, round, and reactive to light. No scleral icterus.  Neck: Normal range of motion. Neck supple. Carotid bruit is not present. No thyromegaly present.  Cardiovascular: Normal rate, regular rhythm and normal heart sounds.  Exam reveals no gallop and no friction rub.   No murmur heard. Pulmonary/Chest: Effort normal and breath sounds normal. No  respiratory distress. He has no wheezes. He has no rales.  Mild rhonchus heard throughout.  Abdominal: Soft. Bowel sounds are normal. He exhibits no distension and no mass. There is no tenderness.  Musculoskeletal: He exhibits no edema.  Lymphadenopathy:    He has no cervical adenopathy.  Neurological: He is alert and oriented to person, place, and time.  Vitals reviewed.      No results found for this or any previous visit (from the past 2160 hour(s)).   Assessment & Plan  Problem List Items Addressed This Visit  Cardiovascular and Mediastinum   HBP (high blood pressure)     Respiratory   Asthma     Endocrine   Hyperlipidemia associated with type 2 diabetes mellitus ([place])   Uncontrolled type 2 diabetes mellitus, with long-term current use of insulin ([place]) - Primary     Genitourinary   Malignant neoplasm of urinary bladder (HCC)     Other   Agitation      No orders of the defined types were placed in this encounter.  1. Uncontrolled type 2 diabetes mellitus with diabetic nephropathy, with long-term current use of insulin (HCC) Use Humalog with meals as directed. Cont Lantus A1c 2. Essential hypertension Stop Benazepril Start Losartan.-50 mg/d. CMP 3. Hyperlipidemia associated with type 2 diabetes mellitus (HCC) Lipid panel  4. Malignant neoplasm of urinary bladder, unspecified site (HCC) CBC  5. Agitation   6. Mild intermittent asthma, unspecified whether complicated

## 2017-01-03 ENCOUNTER — Telehealth: Payer: Self-pay | Admitting: *Deleted

## 2017-01-03 MED ORDER — DULAGLUTIDE 1.5 MG/0.5ML ~~LOC~~ SOAJ: 0.5000 mL | SUBCUTANEOUS | 12 refills | Status: DC

## 2017-01-03 MED ORDER — EZETIMIBE 10 MG PO TABS: 10.0000 mg | ORAL_TABLET | Freq: Every day | ORAL | 12 refills | Status: DC

## 2017-01-03 NOTE — Telephone Encounter (Signed)
Patient will try Zetia.  Patient wants to know if he can try Trulicity instead of Bydureon.

## 2017-01-03 NOTE — Telephone Encounter (Signed)
-----   Message from Arlis Porta., MD sent at 01/03/2017 12:05 PM EST ----- Lets try Zetia, 10 mg., 1 each evening.  It is not a statin but lowers cholesterol.  Does not cause muscle aches. (If agrees, send to pharmacy- Zetia 10 mg., 1 each evening ; #30 / 12 refills).-jh

## 2017-01-03 NOTE — Telephone Encounter (Signed)
OK to send in Trulicity 1.5 [id number] ml pen.  Inject sub-Q once weekly.  Dispense 4 with 12 refills.-jh

## 2017-01-03 NOTE — Telephone Encounter (Signed)
RX submitted.[place]

## 2017-02-14 ENCOUNTER — Ambulatory Visit (INDEPENDENT_AMBULATORY_CARE_PROVIDER_SITE_OTHER): Payer: BLUE CROSS/BLUE SHIELD | Admitting: Family Medicine

## 2017-02-14 ENCOUNTER — Encounter: Payer: Self-pay | Admitting: Family Medicine

## 2017-02-14 VITALS — BP 140/75 | HR 74 | Temp 98.6°F | Resp 16 | Ht 70.0 in | Wt 243.0 lb

## 2017-02-14 DIAGNOSIS — M791 Myalgia: Secondary | ICD-10-CM

## 2017-02-14 DIAGNOSIS — E1165 Type 2 diabetes mellitus with hyperglycemia: Secondary | ICD-10-CM

## 2017-02-14 DIAGNOSIS — Z794 Long term (current) use of insulin: Secondary | ICD-10-CM

## 2017-02-14 DIAGNOSIS — R451 Restlessness and agitation: Secondary | ICD-10-CM

## 2017-02-14 DIAGNOSIS — C679 Malignant neoplasm of bladder, unspecified: Secondary | ICD-10-CM

## 2017-02-14 DIAGNOSIS — E1121 Type 2 diabetes mellitus with diabetic nephropathy: Secondary | ICD-10-CM

## 2017-02-14 DIAGNOSIS — R42 Dizziness and giddiness: Secondary | ICD-10-CM | POA: Diagnosis not present

## 2017-02-14 DIAGNOSIS — I10 Essential (primary) hypertension: Secondary | ICD-10-CM | POA: Diagnosis not present

## 2017-02-14 DIAGNOSIS — IMO0002 Reserved for concepts with insufficient information to code with codable children: Secondary | ICD-10-CM

## 2017-02-14 DIAGNOSIS — M7918 Myalgia, other site: Secondary | ICD-10-CM

## 2017-02-14 MED ORDER — METFORMIN HCL 1000 MG PO TABS: 1000.0000 mg | ORAL_TABLET | Freq: Two times a day (BID) | ORAL | 3 refills | Status: DC

## 2017-02-14 MED ORDER — TRAMADOL HCL 50 MG PO TABS: 50.0000 mg | ORAL_TABLET | Freq: Three times a day (TID) | ORAL | 5 refills | Status: DC | PRN

## 2017-02-14 MED ORDER — FLUOXETINE HCL 40 MG PO CAPS: 40.0000 mg | ORAL_CAPSULE | Freq: Every day | ORAL | 3 refills | Status: DC

## 2017-02-14 MED ORDER — IMIPRAMINE HCL 10 MG PO TABS: 10.0000 mg | ORAL_TABLET | Freq: Every day | ORAL | 3 refills | Status: DC

## 2017-02-14 MED ORDER — AMLODIPINE BESYLATE 5 MG PO TABS: 5.0000 mg | ORAL_TABLET | Freq: Every day | ORAL | 3 refills | Status: DC

## 2017-02-14 NOTE — Progress Notes (Signed)
Name: Henry Horne   MRN: [id number]    DOB: [birth year]   Date:02/14/2017       Progress Note  Subjective  Chief Complaint  Chief Complaint  Patient presents with  . Diabetes  . Hypertension    HPI Here for f/u of HBP and DM.  His cough has resolved with changing from ACE to ARB.  He likes Trulicity better than Bydurion.  BSs 90-160.  He asks to refill Vicodin for rare prn use.  No problem-specific Assessment & Plan notes found for this encounter.   Past Medical History:  Diagnosis Date  . Anxiety   . Cancer of kidney ([place]) 2010   cystoscopy 2010, 2014, 2015, 2016- MD q 6 months  . Depression   . Emphysema of lung ([place]) 1996  . Hyperlipidemia   . Hypertension   . Joint pain   . Prostate cancer [facility]) 2008   removed with cryotherapy    Past Surgical History:  Procedure Laterality Date  . DIGIT NAIL REMOVAL Left 2016   left and right foot -toe nails    Family History  Problem Relation Age of Onset  . Diabetes Mother   . Heart disease Father   . Cancer Father   . Hyperlipidemia Father   . Hypertension Father   . Diabetes Brother   . COPD Brother   . Autoimmune disease Brother   . Heart disease Brother   . Diabetes Brother     Social History   Social History  . Marital status: Married    Spouse name: Butch Penny  . Number of children: N/A  . Years of education: N/A   Occupational History  . Not on file.   Social History Main Topics  . Smoking status: Former Smoker    Years: 30.00    Quit date: 05/18/2014  . Smokeless tobacco: Never Used  . Alcohol use No  . Drug use: No  . Sexual activity: Not on file   Other Topics Concern  . Not on file   Social History Narrative  . No narrative on file     Current Outpatient Prescriptions:  .  albuterol (PROAIR HFA) 108 (90 Base) MCG/ACT inhaler, Inhale 1-2 puffs into the lungs every 4 (four) hours as needed for wheezing or shortness of breath., Disp: 8.5 g, Rfl: 12 .  amLODipine (NORVASC) 5 MG tablet, Take  1 tablet (5 mg total) by mouth daily., Disp: 90 tablet, Rfl: 3 .  Dulaglutide (TRULICITY) 1.5 UT/6.5YY SOPN, Inject 0.5 mLs into the skin once a week., Disp: 4 pen, Rfl: 12 .  FLUoxetine (PROZAC) 40 MG capsule, Take 1 capsule (40 mg total) by mouth daily., Disp: 90 capsule, Rfl: 3 .  Fluticasone-Salmeterol (ADVAIR DISKUS) 250-50 MCG/DOSE AEPB, Inhale 1 puff into the lungs 2 (two) times daily., Disp: 1 each, Rfl: 12 .  Garlic Oil 2 MG CAPS, Take 1,000 mg by mouth 2 (two) times daily. Reported on 11/17/2015, Disp: , Rfl:  .  glucose blood (TRUETEST TEST) test strip, 1 each by Other route 2 (two) times daily. Use as instructed, Disp: 100 each, Rfl: 12 .  HYDROcodone-acetaminophen (NORCO/VICODIN) 5-325 MG tablet, Take 1 tablet by mouth every 6 (six) hours as needed for moderate pain., Disp: 60 tablet, Rfl: 0 .  ibuprofen (ADVIL,MOTRIN) 800 MG tablet, Take 1 tablet (800 mg total) by mouth 3 (three) times daily as needed., Disp: 90 tablet, Rfl: 12 .  imipramine (TOFRANIL) 10 MG tablet, Take 1 tablet (10 mg total) by mouth  at bedtime., Disp: 90 tablet, Rfl: 3 .  insulin glargine (LANTUS) 100 UNIT/ML injection, Inject 0.64 mLs (64 Units total) into the skin at bedtime., Disp: 10 mL, Rfl: 11 .  insulin lispro (HUMALOG) 100 UNIT/ML injection, Inject 0.1 mLs (10 Units total) into the skin 3 (three) times daily before meals. (Patient taking differently: Inject 11 Units into the skin 2 (two) times daily before a meal. ), Disp: 10 mL, Rfl: 11 .  Insulin Pen Needle (NOVOFINE) 30G X 8 MM MISC, Use 1 nweedle to inject Victoza once daily., Disp: 100 each, Rfl: 12 .  Insulin Pen Needle 31G X 5 MM MISC, 1 each by Does not apply route 3 (three) times daily., Disp: 450 each, Rfl: 3 .  losartan (COZAAR) 50 MG tablet, Take 1 tablet (50 mg total) by mouth daily., Disp: 90 tablet, Rfl: 3 .  metFORMIN (GLUCOPHAGE) 1000 MG tablet, Take 1 tablet (1,000 mg total) by mouth 2 (two) times daily., Disp: 180 tablet, Rfl: 3 .  TRUEPLUS  LANCETS 30G MISC, Use 1 lancet  to check Blood sugar twice daily, Disp: 100 each, Rfl: 12 .  traMADol (ULTRAM) 50 MG tablet, Take 1 tablet (50 mg total) by mouth every 8 (eight) hours as needed., Disp: 30 tablet, Rfl: 5  Allergies  Allergen Reactions  . Zetia [Ezetimibe] Other (See Comments)    Joint pain, congestion, muscle ache Joint pain, congestion, muscle ache  . Pneumococcal Vaccine   . Pneumococcal Vaccines      Review of Systems  Constitutional: Negative for chills, fever, malaise/fatigue and weight loss.  HENT: Negative for hearing loss and tinnitus.   Eyes: Negative for blurred vision and double vision.  Respiratory: Negative for cough, shortness of breath and wheezing.   Cardiovascular: Negative for chest pain, palpitations and leg swelling.  Gastrointestinal: Negative for abdominal pain, blood in stool and heartburn.  Genitourinary: Negative for dysuria, frequency and urgency.  Musculoskeletal: Negative for joint pain and myalgias.  Skin: Negative for rash.  Neurological: Negative for dizziness, tingling, tremors, weakness and headaches.  Psychiatric/Behavioral: Negative for depression.      Objective  Vitals:   02/14/17 1102 02/14/17 1142  BP: (!) 146/88 140/75  Pulse: 74   Resp: 16   Temp: 98.6 F (37 C)   TempSrc: Oral   Weight: 243 lb (110.2 kg)   Height: 5' 10"  (1.778 m)     Physical Exam  Constitutional: He is oriented to person, place, and time and well-developed, well-nourished, and in no distress. No distress.  HENT:  Head: Normocephalic and atraumatic.  Eyes: Conjunctivae and EOM are normal. Pupils are equal, round, and reactive to light. No scleral icterus.  Neck: Normal range of motion. Neck supple. Carotid bruit is not present. No thyromegaly present.  Cardiovascular: Normal rate, regular rhythm and normal heart sounds.  Exam reveals no gallop and no friction rub.   No murmur heard. Pulmonary/Chest: Effort normal and breath sounds normal. No  respiratory distress. He has no wheezes. He has no rales.  Musculoskeletal: He exhibits no edema.  Lymphadenopathy:    He has no cervical adenopathy.  Neurological: He is alert and oriented to person, place, and time.  Vitals reviewed.      Recent Results (from the past 2160 hour(s))  COMPLETE METABOLIC PANEL WITH GFR     Status: Abnormal   Collection Time: 12/30/16 12:01 AM  Result Value Ref Range   Sodium 136 135 - 146 mmol/L   Potassium 4.6 3.5 - 5.3 mmol/L  Chloride 102 98 - 110 mmol/L   CO2 27 20 - 31 mmol/L   Glucose, Bld 197 (H) 65 - 99 mg/dL   BUN 19 7 - 25 mg/dL   Creat 1.00 0.70 - 1.25 mg/dL    Comment:   For patients > or = 67 years of age: The upper reference limit for Creatinine is approximately 13% higher for people identified as African-American.      Total Bilirubin 0.5 0.2 - 1.2 mg/dL   Alkaline Phosphatase 57 40 - 115 U/L   AST 21 10 - 35 U/L   ALT 32 9 - 46 U/L   Total Protein 6.7 6.1 - 8.1 g/dL   Albumin 4.2 3.6 - 5.1 g/dL   Calcium 9.1 8.6 - 10.3 mg/dL   GFR, Est African American >89 >=60 mL/min   GFR, Est Non African American 78 >=60 mL/min  CBC with Differential     Status: Abnormal   Collection Time: 12/30/16 12:01 AM  Result Value Ref Range   WBC 7.4 3.8 - 10.8 K/uL   RBC 5.46 4.20 - 5.80 MIL/uL   Hemoglobin 15.3 13.2 - 17.1 g/dL   HCT 46.1 38.5 - 50.0 %   MCV 84.4 80.0 - 100.0 fL   MCH 28.0 27.0 - 33.0 pg   MCHC 33.2 32.0 - 36.0 g/dL   RDW 13.2 11.0 - 15.0 %   Platelets 231 140 - 400 K/uL   MPV 9.9 7.5 - 12.5 fL   Neutro Abs 4,440 1,500 - 7,800 cells/uL   Lymphs Abs 1,628 850 - 3,900 cells/uL   Monocytes Absolute 740 200 - 950 cells/uL   Eosinophils Absolute 518 (H) 15 - 500 cells/uL   Basophils Absolute 74 0 - 200 cells/uL   Neutrophils Relative % 60 %   Lymphocytes Relative 22 %   Monocytes Relative 10 %   Eosinophils Relative 7 %   Basophils Relative 1 %   Smear Review Criteria for review not met   Lipid Profile     Status:  Abnormal   Collection Time: 12/30/16 12:01 AM  Result Value Ref Range   Cholesterol 201 (H) <200 mg/dL   Triglycerides 144 <150 mg/dL   HDL 30 (L) >40 mg/dL   Total CHOL/HDL Ratio 6.7 (H) <5.0 Ratio   VLDL 29 <30 mg/dL   LDL Cholesterol 142 (H) <100 mg/dL  HgB A1c     Status: Abnormal   Collection Time: 12/30/16 12:01 AM  Result Value Ref Range   Hgb A1c MFr Bld 9.1 (H) <5.7 %    Comment:   For someone without known diabetes, a hemoglobin A1c value of 6.5% or greater indicates that they may have diabetes and this should be confirmed with a follow-up test.   For someone with known diabetes, a value <7% indicates that their diabetes is well controlled and a value greater than or equal to 7% indicates suboptimal control. A1c targets should be individualized based on duration of diabetes, age, comorbid conditions, and other considerations.   Currently, no consensus exists for use of hemoglobin A1c for diagnosis of diabetes for children.      Mean Plasma Glucose 214 mg/dL     Assessment & Plan  Problem List Items Addressed This Visit      Cardiovascular and Mediastinum   HBP (high blood pressure) - Primary   Relevant Medications   amLODipine (NORVASC) 5 MG tablet     Endocrine   Uncontrolled type 2 diabetes mellitus, with long-term current use of  insulin (HCC)   Relevant Medications   metFORMIN (GLUCOPHAGE) 1000 MG tablet     Genitourinary   Bladder cancer (HCC)     Other   Agitation   Relevant Medications   FLUoxetine (PROZAC) 40 MG capsule   Dizziness   Relevant Medications   imipramine (TOFRANIL) 10 MG tablet    Other Visit Diagnoses    Musculoskeletal pain       Relevant Medications   traMADol (ULTRAM) 50 MG tablet      Meds ordered this encounter  Medications  . metFORMIN (GLUCOPHAGE) 1000 MG tablet    Sig: Take 1 tablet (1,000 mg total) by mouth 2 (two) times daily.    Dispense:  180 tablet    Refill:  3  . amLODipine (NORVASC) 5 MG tablet     Sig: Take 1 tablet (5 mg total) by mouth daily.    Dispense:  90 tablet    Refill:  3  . FLUoxetine (PROZAC) 40 MG capsule    Sig: Take 1 capsule (40 mg total) by mouth daily.    Dispense:  90 capsule    Refill:  3  . imipramine (TOFRANIL) 10 MG tablet    Sig: Take 1 tablet (10 mg total) by mouth at bedtime.    Dispense:  90 tablet    Refill:  3  . traMADol (ULTRAM) 50 MG tablet    Sig: Take 1 tablet (50 mg total) by mouth every 8 (eight) hours as needed.    Dispense:  30 tablet    Refill:  5   1. Essential hypertension Cont others - amLODipine (NORVASC) 5 MG tablet; Take 1 tablet (5 mg total) by mouth daily.  Dispense: 90 tablet; Refill: 3  2. Uncontrolled type 2 diabetes mellitus with diabetic nephropathy, with long-term current use of insulin (HCC) Cont insulin and Trulicity - metFORMIN (GLUCOPHAGE) 1000 MG tablet; Take 1 tablet (1,000 mg total) by mouth 2 (two) times daily.  Dispense: 180 tablet; Refill: 3  3. Malignant neoplasm of urinary bladder, unspecified site ([place])   4. Agitation  - FLUoxetine (PROZAC) 40 MG capsule; Take 1 capsule (40 mg total) by mouth daily.  Dispense: 90 capsule; Refill: 3  5. Musculoskeletal pain Discontinue Vicodin - traMADol (ULTRAM) 50 MG tablet; Take 1 tablet (50 mg total) by mouth every 8 (eight) hours as needed.  Dispense: 30 tablet; Refill: 5  6. Dizziness  - imipramine (TOFRANIL) 10 MG tablet; Take 1 tablet (10 mg total) by mouth at bedtime.  Dispense: 90 tablet; Refill: 3

## 2017-02-23 ENCOUNTER — Encounter: Payer: Self-pay | Admitting: Family Medicine

## 2017-02-23 ENCOUNTER — Ambulatory Visit (INDEPENDENT_AMBULATORY_CARE_PROVIDER_SITE_OTHER): Payer: BLUE CROSS/BLUE SHIELD | Admitting: Family Medicine

## 2017-02-23 VITALS — BP 158/62 | HR 91 | Temp 98.7°F | Resp 16 | Ht 70.0 in | Wt 242.0 lb

## 2017-02-23 DIAGNOSIS — R509 Fever, unspecified: Secondary | ICD-10-CM

## 2017-02-23 DIAGNOSIS — J111 Influenza due to unidentified influenza virus with other respiratory manifestations: Secondary | ICD-10-CM | POA: Diagnosis not present

## 2017-02-23 LAB — POCT INFLUENZA A/B
INFLUENZA B, POC: NEGATIVE
Influenza A, POC: NEGATIVE

## 2017-02-23 MED ORDER — OSELTAMIVIR PHOSPHATE 75 MG PO CAPS: 75.0000 mg | ORAL_CAPSULE | Freq: Two times a day (BID) | ORAL | 0 refills | Status: DC

## 2017-02-23 MED ORDER — BENZONATATE 100 MG PO CAPS: 100.0000 mg | ORAL_CAPSULE | Freq: Three times a day (TID) | ORAL | 0 refills | Status: DC | PRN

## 2017-02-23 MED ORDER — IPRATROPIUM BROMIDE 0.06 % NA SOLN: 2.0000 | Freq: Four times a day (QID) | NASAL | 0 refills | Status: DC

## 2017-02-23 NOTE — Progress Notes (Signed)
Subjective:    Patient ID: Henry Horne, male    DOB: 11-06-1951, 66 y.o.   MRN: [id number]  Henry Horne is a 66 y.o. male presenting on 02/23/2017 for Nasal Congestion (runny nose cold cough onset 3 days this morning was chills )  Patient presents for a same day appointment.  HPI  INFLUENZA Reports symptoms started 2-3 days ago with upset stomach nausea, without vomiting and diarrhea, some interval improvement, has worsened to sinus congestion, productive cough with thicker sputum, generalized muscle aches and some chills and sweats without known fever - Tolerating PO food and fluids well, taking OTC Mucinex-Sinus and cough med with some good results - Former smoker quit 2005, has Advair and Albuterol some relief - Works at [employer] in [place], admits a lot of sick contacts - Wife sick with similar symptoms >1 week ago, she was sick first, passed to him. He received flu vaccine 07/2016, has had flu before - Denies any ear pain or pressure, chest pain or pressure, dyspnea, abdominal pain, vomiting, diarrhea  Social History  Substance Use Topics  . Smoking status: Former Smoker    Years: 30.00    Quit date: 05/18/2004  . Smokeless tobacco: Never Used  . Alcohol use No    Review of Systems Per HPI unless specifically indicated above     Objective:    BP (!) 158/62   Pulse 91   Temp 98.7 F (37.1 C) (Oral)   Resp 16   Ht 5\' 10"  (1.778 m)   Wt 242 lb (109.8 kg)   SpO2 97%   BMI 34.72 kg/m   Wt Readings from Last 3 Encounters:  02/23/17 242 lb (109.8 kg)  02/14/17 243 lb (110.2 kg)  12/30/16 246 lb (111.6 kg)    Physical Exam  Constitutional: He is oriented to person, place, and time. He appears well-developed and well-nourished. No distress.  Well-appearing, comfortable, cooperative, overweight  HENT:  Head: Normocephalic and atraumatic.  Mouth/Throat: Oropharynx is clear and moist.  Frontal / maxillary sinuses non-tender. Nares with some mild turbinate  edema without purulence. Bilateral TMs clear without erythema, effusion, do have some mild fullness evidence of pressure but not bulging. Oropharynx clear without erythema, exudates, edema or asymmetry.  Eyes: Conjunctivae are normal. Right eye exhibits no discharge. Left eye exhibits no discharge.  Neck: Normal range of motion. Neck supple.  Cardiovascular: Normal rate, regular rhythm, normal heart sounds and intact distal pulses.   No murmur heard. Pulmonary/Chest: Effort normal and breath sounds normal. No respiratory distress. He has no wheezes. He has no rales.  Mildly reduced air movement due to effort and body habitus, but overall clear breath sounds, initially one scattered wheeze transmitted upper airway, but no coarse breath sounds, no wheezing, no focal crackles  Musculoskeletal: He exhibits no edema.  Lymphadenopathy:    He has no cervical adenopathy.  Neurological: He is alert and oriented to person, place, and time.  Skin: Skin is warm and dry. No rash noted. He is not diaphoretic. No erythema.  Psychiatric: His behavior is normal.  Nursing note and vitals reviewed.   I have personally reviewed the following lab results from 12/2016.  Results for orders placed or performed in visit on 02/23/17  POCT Influenza A/B  Result Value Ref Range   Influenza A, POC Negative Negative   Influenza B, POC Negative Negative      Assessment & Plan:   Problem List Items Addressed This Visit    None  Visit Diagnoses    Influenza    -  Primary  Clinically diagnosed influenza despite negative rapid flu test today, concern for flu still due to significant known exposure to suspected influenza - Duration x < 72 hours, without complication. Tolerating PO and well hydrated - No other focal findings of infection today - S/p influenza vaccine this season 07/2016  Plan: 1. Start Tamiflu 75mg  capsules BID x 5 days 2. Start Atrovent nasal spray decongestant 2 sprays in each nostril up to 4  times daily for 7 days 3. Start Tessalon Perls take 1 capsule up to 3 times a day as needed for cough 4. Supportive care as advised with NSAID / Tylenol PRN fever/myalgias, improve hydration, may take OTC Cold/Flu meds 5. Return criteria given if significant worsening, consider post-influenza complications, otherwise follow-up if needed     Relevant Medications   ipratropium (ATROVENT) 0.06 % nasal spray   benzonatate (TESSALON) 100 MG capsule   oseltamivir (TAMIFLU) 75 MG capsule   Fever chills       Relevant Orders   POCT Influenza A/B (Completed)      Meds ordered this encounter  Medications  . ipratropium (ATROVENT) 0.06 % nasal spray    Sig: Place 2 sprays into both nostrils 4 (four) times daily. For up to 5-7 days then stop.    Dispense:  15 mL    Refill:  0  . benzonatate (TESSALON) 100 MG capsule    Sig: Take 1 capsule (100 mg total) by mouth 3 (three) times daily as needed for cough.    Dispense:  30 capsule    Refill:  0  . oseltamivir (TAMIFLU) 75 MG capsule    Sig: Take 1 capsule (75 mg total) by mouth 2 (two) times daily. For 5 days    Dispense:  10 capsule    Refill:  0    Follow up plan: Return in about 1 week (around 03/02/2017), or if symptoms worsen or fail to improve, for influenza / bronchitis.  [name], [facility] 02/23/2017, 9:42 AM

## 2017-02-23 NOTE — Patient Instructions (Signed)
Thank you for coming in to clinic today.  1.  Your flu test was NEGATIVE, this is not 100% though, and you can still have the flu with a negative test, otherwise it could be a different viral syndrome.  - Start Tamiflu (anti-flu medicine) take one capsule 75mg  twice a day for 5 days - Wash hands and cover cough very well to avoid spread of infection - For symptom control:      - Take Ibuprofen / Advil 400-600mg  every 6-8 hours as needed for fever / muscle aches, and may also take Tylenol 500-1000mg  per dose every 6-8 hours or 3 times a day, can alternate dosing      - Start Tessalon perls one every 8 hours or 3 times a day as needed for cough      - Start Atrovent nasal spray decongestant 2 sprays in each nostril up to 4 times daily for 7 days      - Start OTC Mucinex-DM for cough and congestion for up to 7 days - Improve hydration with plenty of clear fluids  If significant worsening with poor fluid intake, worsening fever, difficulty breathing due to coughing, worsening body aches, weakness, or other more concerning symptoms difficulty breathing you can seek treatment at Emergency Department. Also if improved flu symptoms and then worsening days to week later with concerns for bronchitis, productive cough fever chills again we may need to check for possible pneumonia that can occur after the flu  Please schedule a follow-up appointment with Dr. Parks Ranger in 1-2 weeks as needed if worsening from Flu / Bronchitis  If you have any other questions or concerns, please feel free to call the clinic or send a message through [place]. You may also schedule an earlier appointment if necessary.  [name], DO [place]

## 2017-05-26 ENCOUNTER — Ambulatory Visit: Payer: BLUE CROSS/BLUE SHIELD | Admitting: Family Medicine

## 2017-06-29 ENCOUNTER — Encounter: Payer: Self-pay | Admitting: Family Medicine

## 2017-06-29 ENCOUNTER — Ambulatory Visit (INDEPENDENT_AMBULATORY_CARE_PROVIDER_SITE_OTHER): Payer: BLUE CROSS/BLUE SHIELD | Admitting: Family Medicine

## 2017-06-29 VITALS — BP 150/72 | HR 75 | Temp 98.4°F | Resp 16 | Ht 70.0 in | Wt 239.0 lb

## 2017-06-29 DIAGNOSIS — E1121 Type 2 diabetes mellitus with diabetic nephropathy: Secondary | ICD-10-CM | POA: Diagnosis not present

## 2017-06-29 DIAGNOSIS — Z794 Long term (current) use of insulin: Secondary | ICD-10-CM

## 2017-06-29 DIAGNOSIS — E1165 Type 2 diabetes mellitus with hyperglycemia: Secondary | ICD-10-CM | POA: Diagnosis not present

## 2017-06-29 DIAGNOSIS — C61 Malignant neoplasm of prostate: Secondary | ICD-10-CM | POA: Diagnosis not present

## 2017-06-29 DIAGNOSIS — IMO0002 Reserved for concepts with insufficient information to code with codable children: Secondary | ICD-10-CM

## 2017-06-29 DIAGNOSIS — C671 Malignant neoplasm of dome of bladder: Secondary | ICD-10-CM | POA: Diagnosis not present

## 2017-06-29 LAB — POCT GLYCOSYLATED HEMOGLOBIN (HGB A1C): HEMOGLOBIN A1C: 8.5 — AB (ref ?–5.7)

## 2017-06-29 MED ORDER — INSULIN GLARGINE 100 UNIT/ML ~~LOC~~ SOLN: 64.0000 [IU] | Freq: Every day | SUBCUTANEOUS | 11 refills | Status: DC

## 2017-06-29 MED ORDER — INSULIN DEGLUDEC 100 UNIT/ML ~~LOC~~ SOPN: 60.0000 [IU] | PEN_INJECTOR | Freq: Every day | SUBCUTANEOUS | 5 refills | Status: DC

## 2017-06-29 MED ORDER — INSULIN LISPRO 100 UNIT/ML (KWIKPEN): 10.0000 [IU] | PEN_INJECTOR | Freq: Two times a day (BID) | SUBCUTANEOUS | 3 refills | Status: DC

## 2017-06-29 NOTE — Assessment & Plan Note (Signed)
>>  ASSESSMENT AND PLAN FOR HISTORY OF BLADDER CANCER WRITTEN ON 06/29/2017  4:33 PM BY Anabell Swint J, DO  Stable, recent cystoscopy Followed by Dr Ike [facility] Considered SGLT2 med for DM but agree contraindicated with bladder cancer risk

## 2017-06-29 NOTE — Assessment & Plan Note (Signed)
Stable, recent cystoscopy Followed by Dr Cope [facility] Considered SGLT2 med for DM but agree contraindicated with bladder cancer risk 

## 2017-06-29 NOTE — Patient Instructions (Addendum)
Thank you for coming to the clinic today.  1. Consider the following med options:  - Tresiba (insulin degludec) FlexPen U100 vs U200 - same dose as lantus, long acting insulin would REPLACE lantus   Resent rx Humalog  Consider switching Lantus to TWICE daily dosing 30 to 32 units one dose TWICE daily / or can switch to 20 u in AM and 40 u in PM  ONLY NEED ONE LONG ACTING INSULIN - either Lantus or Tresiba  Please schedule a Follow-up Appointment to: Return in about 3 months (around 09/29/2017) for diabetes.  If you have any other questions or concerns, please feel free to call the clinic or send a message through [place]. You may also schedule an earlier appointment if necessary.  Additionally, you may be receiving a survey about your experience at our clinic within a few days to 1 week by e-mail or mail. We value your feedback.  [name], DO [place]

## 2017-06-29 NOTE — Progress Notes (Signed)
Subjective:    Patient ID: Henry Horne, male    DOB: 1951-04-29, 66 y.o.   MRN: [id number]  Henry Horne is a 66 y.o. male presenting on 06/29/2017 for Diabetes (highest BS 230 and lowest 99)   HPI   CHRONIC DM, Type 2: Reports no concerns, pleased with some improvement. He requests refill on Lantus or basal insulin today. CBGs: does not have cbg results today, recalls some readings, no significant hypoglycemia < 100 Meds: Lantus 64u daily, Humalog 8u BID (breakfast and dinner, does not use during lunch time), Dulaglutide 1.5mg  (weekly), Metformin 1000mg  BID - History of Victoza in past on diabetic program through prior insurance. Failed Bydureon (had local reaction) Reports good compliance except Humalog injections. Tolerating well w/o side-effects Currently on ARB Lifestyle: - Diet (balanced diet, admits can improve on lower carbs/sugars)  - Exercise (active walking) Denies hypoglycemia, polyuria, visual changes, numbness or tingling.  PMH - History of bladder cancer (Carcinoma in situ), also with history of prostate cancer, followed by [facility] Dr Jacqlyn Larsen  Health Maintenance: - History of allergy to initial Pneumococcal vaccine, declines any repeat pneumonia vaccines   Social History  Substance Use Topics  . Smoking status: Former Smoker    Years: 30.00    Quit date: 05/18/2004  . Smokeless tobacco: Never Used  . Alcohol use No    Review of Systems Per HPI unless specifically indicated above     Objective:    BP (!) 150/72   Pulse 75   Temp 98.4 F (36.9 C) (Oral)   Resp 16   Ht 5\' 10"  (1.778 m)   Wt 239 lb (108.4 kg)   BMI 34.29 kg/m   Wt Readings from Last 3 Encounters:  06/29/17 239 lb (108.4 kg)  02/23/17 242 lb (109.8 kg)  02/14/17 243 lb (110.2 kg)    Physical Exam  Constitutional: He is oriented to person, place, and time. He appears well-developed and well-nourished. No distress.  Well-appearing, comfortable, cooperative    HENT:  Head: Normocephalic and atraumatic.  Mouth/Throat: Oropharynx is clear and moist.  Eyes: Conjunctivae are normal. Right eye exhibits no discharge. Left eye exhibits no discharge.  Neck: Normal range of motion. Neck supple.  Cardiovascular: Normal rate, regular rhythm, normal heart sounds and intact distal pulses.   No murmur heard. Pulmonary/Chest: Effort normal.  Musculoskeletal: Normal range of motion. He exhibits no edema.  Neurological: He is alert and oriented to person, place, and time.  Skin: Skin is warm and dry. No rash noted. He is not diaphoretic. No erythema.  Psychiatric: He has a normal mood and affect. His behavior is normal.  Well groomed, good eye contact, normal speech and thoughts  Nursing note and vitals reviewed.    Recent Labs  08/23/16 1109 12/30/16 0001 06/29/17 0946  HGBA1C 7.6% 9.1* 8.5*   Diabetic Foot Exam - Simple   Simple Foot Form Diabetic Foot exam was performed with the following findings:  Yes 06/29/2017  9:45 AM  Visual Inspection No deformities, no ulcerations, no other skin breakdown bilaterally:  Yes See comments:  Yes Sensation Testing Intact to touch and monofilament testing bilaterally:  Yes Pulse Check Posterior Tibialis and Dorsalis pulse intact bilaterally:  Yes Comments Callus formation bilateral feet mostly heels, uncomplicated. No ulceration.        Assessment & Plan:   Problem List Items Addressed This Visit    Uncontrolled type 2 diabetes mellitus, with long-term current use of insulin ([place]) - Primary  Improving control DM, still sub-optimal, A1c 8.5 (from 9.1) No hypoglycemia Complications - peripheral neuropathy Failed: Victoza (cost), Bydureon (local reaction) Contraindicated SGLT2 (bladder cancer)  Plan:  1. Discussion on adjusting DM regimen today - focused on insulin - current Lantus 64u nightly - recommended switch to alternative higher concentration basal insulin Tresiba - new rx written U100 60u  daily, he can check cost/coverage at pharmacy, also printed refill Lantus for 64u daily - may consider alternatively splitting Lantus dose 30-32u BID instead 2. Increase Humalog to 10u BID wc (avoid skipped doses to improve control) 3. Continue Metformin 1000mg  BID, Dulaglutide 1.5mg  weekly 4. Considered SGLT2 as other agent, however concern contraindicated with bladder cancer - patient declines 5. Encourage improved lifestyle - low carb, low sugar diet, reduce portion size, continue improving regular exercise 3. Check CBG , bring log to next visit for review 4. Continue ARB - consider ASA, Statin in future 5. DM Foot exam done today / Advised to schedule DM ophtho exam, send record 6. Follow-up 3 months DM A1c      Relevant Medications   insulin lispro (HUMALOG KWIKPEN) 100 UNIT/ML KiwkPen   insulin degludec (TRESIBA FLEXTOUCH) 100 UNIT/ML SOPN FlexTouch Pen   Other Relevant Orders   POCT HgB A1C (Completed)   Malignant neoplasm of prostate (HCC)    Stable, recent cystoscopy. 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 insulin lispro (HUMALOG KWIKPEN) 100 UNIT/ML KiwkPen    Sig: Inject 0.1 mLs (10 Units total) into the skin 2 (two) times daily with a meal.    Dispense:  5 pen    Refill:  3  . DISCONTD: insulin glargine (LANTUS) 100 UNIT/ML injection    Sig: Inject 0.64 mLs (64 Units total) into the skin at bedtime. Or may split dose for 30-32 units twice daily.    Dispense:  10 mL    Refill:  11  . insulin degludec (TRESIBA FLEXTOUCH) 100 UNIT/ML SOPN FlexTouch Pen    Sig: Inject  0.6 mLs (60 Units total) into the skin daily at 10 pm.    Dispense:  15 mL    Refill:  5     Follow up plan: Return in about 3 months (around 09/29/2017) for diabetes.  [name], DO [facility] 06/29/2017, 4:33 PM

## 2017-06-29 NOTE — Assessment & Plan Note (Signed)
Improving control DM, still sub-optimal, A1c 8.5 (from 9.1) No hypoglycemia Complications - peripheral neuropathy Failed: Victoza (cost), Bydureon (local reaction) Contraindicated SGLT2 (bladder cancer)  Plan:  1. Discussion on adjusting DM regimen today - focused on insulin - current Lantus 64u nightly - recommended switch to alternative higher concentration basal insulin Tresiba - new rx written U100 60u daily, he can check cost/coverage at pharmacy, also printed refill Lantus for 64u daily - may consider alternatively splitting Lantus dose 30-32u BID instead 2. Increase Humalog to 10u BID wc (avoid skipped doses to improve control) 3. Continue Metformin 1000mg  BID, Dulaglutide 1.5mg  weekly 4. Considered SGLT2 as other agent, however concern contraindicated with bladder cancer - patient declines 5. Encourage improved lifestyle - low carb, low sugar diet, reduce portion size, continue improving regular exercise 3. Check CBG , bring log to next visit for review 4. Continue ARB - consider ASA, Statin in future 5. DM Foot exam done today / Advised to schedule DM ophtho exam, send record 6. Follow-up 3 months DM A1c

## 2017-06-29 NOTE — Assessment & Plan Note (Signed)
Stable, recent cystoscopy. PSA monitoring Followed by Dr Cope [facility] 

## 2017-06-30 ENCOUNTER — Ambulatory Visit: Payer: BLUE CROSS/BLUE SHIELD | Admitting: Family Medicine

## 2017-10-04 ENCOUNTER — Ambulatory Visit (INDEPENDENT_AMBULATORY_CARE_PROVIDER_SITE_OTHER): Payer: BLUE CROSS/BLUE SHIELD | Admitting: Family Medicine

## 2017-10-04 ENCOUNTER — Other Ambulatory Visit: Payer: Self-pay | Admitting: Family Medicine

## 2017-10-04 ENCOUNTER — Encounter: Payer: Self-pay | Admitting: Family Medicine

## 2017-10-04 VITALS — BP 124/70 | HR 75 | Temp 98.8°F | Resp 16 | Ht 70.0 in | Wt 241.0 lb

## 2017-10-04 DIAGNOSIS — IMO0002 Reserved for concepts with insufficient information to code with codable children: Secondary | ICD-10-CM

## 2017-10-04 DIAGNOSIS — I10 Essential (primary) hypertension: Secondary | ICD-10-CM

## 2017-10-04 DIAGNOSIS — M159 Polyosteoarthritis, unspecified: Secondary | ICD-10-CM | POA: Insufficient documentation

## 2017-10-04 DIAGNOSIS — Z794 Long term (current) use of insulin: Secondary | ICD-10-CM | POA: Diagnosis not present

## 2017-10-04 DIAGNOSIS — E785 Hyperlipidemia, unspecified: Secondary | ICD-10-CM

## 2017-10-04 DIAGNOSIS — E1165 Type 2 diabetes mellitus with hyperglycemia: Secondary | ICD-10-CM | POA: Diagnosis not present

## 2017-10-04 DIAGNOSIS — E1140 Type 2 diabetes mellitus with diabetic neuropathy, unspecified: Secondary | ICD-10-CM

## 2017-10-04 DIAGNOSIS — Z8551 Personal history of malignant neoplasm of bladder: Secondary | ICD-10-CM

## 2017-10-04 DIAGNOSIS — E1169 Type 2 diabetes mellitus with other specified complication: Secondary | ICD-10-CM

## 2017-10-04 DIAGNOSIS — M8949 Other hypertrophic osteoarthropathy, multiple sites: Secondary | ICD-10-CM

## 2017-10-04 DIAGNOSIS — M150 Primary generalized (osteo)arthritis: Secondary | ICD-10-CM | POA: Diagnosis not present

## 2017-10-04 DIAGNOSIS — Z1159 Encounter for screening for other viral diseases: Secondary | ICD-10-CM

## 2017-10-04 LAB — POCT GLYCOSYLATED HEMOGLOBIN (HGB A1C): Hemoglobin A1C: 8.7 — AB (ref ?–5.7)

## 2017-10-04 MED ORDER — TRAMADOL HCL 50 MG PO TABS: 50.0000 mg | ORAL_TABLET | Freq: Three times a day (TID) | ORAL | 2 refills | Status: DC | PRN

## 2017-10-04 NOTE — Patient Instructions (Addendum)
Thank you for coming to the clinic today.  1.  Keep working on lifestyle diet and exercise  Recommend to start taking Tylenol Extra Strength 500mg  tabs - take 1 to 2 tabs per dose (max 1000mg ) every 6-8 hours for pain (take regularly, don't skip a dose for next 7 days), max 24 hour daily dose is 6 tablets or 3000mg . In the future you can repeat the same everyday Tylenol course for 1-2 weeks at a time.  Refilled Tramadol PRN for pain  Use ibuprofen with caution in future  Leg cramps - Try spoonful of yellow mustard to relieve leg cramps or try daily to prevent the problem  - OTC natural option is Hyland's Leg Cramps (Dissolving tablet) take as needed for muscle cramps ----------------------------------------  Call insurance find cost and coverage of the following  Ozempic (Semaglutide injection) - start 0.25mg  weekly for 4 weeks then increase to 0.5mg  weekly - This one has best benefit of weight loss and reducing Cardiovascular events  Tresiba (more concentrated lantus insulin)  DUE for FASTING BLOOD WORK (no food or drink after midnight before the lab appointment, only water or coffee without cream/sugar on the morning of)  SCHEDULE "Lab Only" visit in the morning at the clinic for lab draw in 3 MONTHS  - Make sure Lab Only appointment is at about 1 week before your next appointment, so that results will be available  For Lab Results, once available within 2-3 days of blood draw, you can can log in to MyChart online to view your results and a brief explanation. Also, we can discuss results at next follow-up visit.  Please schedule a Follow-up Appointment to: Return in about 3 months (around 01/04/2018) for Annual Physical.  If you have any other questions or concerns, please feel free to call the clinic or send a message through [place]. You may also schedule an earlier appointment if necessary.  Additionally, you may be receiving a survey about your experience at our clinic within  a few days to 1 week by e-mail or mail. We value your feedback.  [name], DO [place]

## 2017-10-04 NOTE — Assessment & Plan Note (Signed)
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 Imaging with bone scan 2008 for cancer found reduced cartilage on joints No recent X-rays Improved on Ibuprofen and Tramadol in past  Plan: 1. Discussed arthritis management 2. Advised to STOP Ibuprofen 800 for now, switch to regular Tylenol Extra Str more often dosing for mild aches pains 3. Agree refill Tramadol 50mg  q 8 hr PRN #30 +2 refills printed for a 3 month supply, due to risk of affecting kidney on high dose ibuprofen, and will help keep patient functional to keep working now - use only PRN flare - Future consider topical Diclofenac PRN - RICE therapy, for feet try arch support insoles may need podiatry again 4. Follow-up 3 months re-eval, may continue tramadol will consider [place] [employer] and may need UDS, consider steroid inj knee/shoulders, future PT, ortho as need

## 2017-10-04 NOTE — Progress Notes (Signed)
Subjective:    Patient ID: Henry Horne, male    DOB: Oct 06, 1951, 66 y.o.   MRN: [id number]  Henry Horne is a 66 y.o. male presenting on 10/04/2017 for Diabetes   HPI   CHRONIC DM, Type 2 with history of DM Neuropathy Reports concern still elevated sugar. He could not afford [place] insulin, will check again with pharmacy. CBGs: does not have cbg results today but reports avg CBG 180, high >200, no significant hypoglycemia < 100 Meds: No change - Lantus 64u daily, Humalog 8u BID (breakfast and dinner, does not use during lunch time), Dulaglutide 1.5mg  (weekly), Metformin 1000mg  BID - History of Victoza in past on diabetic program through prior insurance. Failed Bydureon (had local reaction knots) Reports good compliance except Humalog injections. Tolerating well w/o side-effects Currently on ARB Lifestyle: - Diet (unchanged since last visit balanced diet, admits can improve on lower carbs/sugars)  - Exercise (active walking) - Due for DM Eye exam at [facility] in [place], states he will go in 2019 - Prior history of nerve testing L>R reduced sensation Denies hypoglycemia, polyuria, visual changes, numbness or tingling.  History of osteoarthritis multiple joints - Reports chronic intermittent pain in Right lower extremity R knee, and feet, R>L, with aching pain including R foot arch, improved with new pair of shoes, and has been using compression sock now with significant improvement - Previous PCP rx Tramadol 50mg  1-3 per day only if flare PRN, has ran out, also had shoulder pain would wake up in evening. In past was on Hydrocodone - Not taking Tylenol - Taking Ibuprofen 800mg  occasionally with some relief - Prior imaging with bone scan 12/2006 for eval of prostate/bladder cancer, also identified early joint cartilage wear in knees and other joints. No recent X-ray imaging - Denies joint swelling, deformity, fever/chills, redness, injury, fall, numbness, tingling  weakness  Health Maintenance: - UTD Flu Shot  Depression screen [facility] 2/9 10/04/2017 02/14/2017 06/22/2016  Decreased Interest 0 0 0  Down, Depressed, Hopeless 0 0 0  PHQ - 2 Score 0 0 0  Altered sleeping - - -  Tired, decreased energy - - -  Change in appetite - - -  Feeling bad or failure about yourself  - - -  Trouble concentrating - - -  Moving slowly or fidgety/restless - - -  Suicidal thoughts - - -  PHQ-9 Score - - -  Difficult doing work/chores - - -    Social History   Tobacco Use  . Smoking status: Former Smoker    Years: 30.00    Last attempt to quit: 05/18/2004    Years since quitting: 13.3  . Smokeless tobacco: Never Used  Substance Use Topics  . Alcohol use: No    Alcohol/week: 0.0 oz  . Drug use: No    Review of Systems Per HPI unless specifically indicated above     Objective:    BP 124/70 (BP Location: Left Arm, Cuff Size: Normal)   Pulse 75   Temp 98.8 F (37.1 C) (Oral)   Resp 16   Ht 5\' 10"  (1.778 m)   Wt 241 lb (109.3 kg)   BMI 34.58 kg/m   Wt Readings from Last 3 Encounters:  10/04/17 241 lb (109.3 kg)  06/29/17 239 lb (108.4 kg)  02/23/17 242 lb (109.8 kg)    Physical Exam  Constitutional: He is oriented to person, place, and time. He appears well-developed and well-nourished. No distress.  Well-appearing, comfortable, cooperative  HENT:  Head: Normocephalic and atraumatic.  Mouth/Throat: Oropharynx is clear and moist.  Eyes: Conjunctivae are normal. Right eye exhibits no discharge. Left eye exhibits no discharge.  Cardiovascular: Normal rate.  Pulmonary/Chest: Effort normal.  Musculoskeletal: Normal range of motion. He exhibits no edema.  Bilateral knees No bulky appearance. No crepitus Full ROM Non tender.  Neurological: He is alert and oriented to person, place, and time.  Skin: Skin is warm and dry. No rash noted. He is not diaphoretic. No erythema.  Psychiatric: He has a normal mood and affect. His behavior is normal.  Well  groomed, good eye contact, normal speech and thoughts  Nursing note and vitals reviewed.  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 Imaging with bone scan 2008 for cancer found reduced cartilage on joints No recent X-rays Improved on Ibuprofen and Tramadol in past  Plan: 1. Discussed arthritis management 2. Advised to STOP Ibuprofen 800 for now, switch to regular Tylenol Extra Str more often dosing for mild aches pains 3. Agree refill Tramadol 50mg  q 8 hr PRN #30 +2 refills printed for a 3 month supply, due to risk of affecting kidney on high dose ibuprofen, and will help keep patient functional to keep working now - use only PRN flare - Future consider topical Diclofenac PRN - RICE therapy, for feet try arch support insoles may need podiatry again 4. Follow-up 3 months re-eval, may continue tramadol will consider [employer] Controlled Contract and may need UDS, consider steroid inj knee/shoulders, future PT, ortho as need      Relevant Medications   traMADol (ULTRAM) 50 MG tablet   Uncontrolled type 2 diabetes with neuropathy (HCC) - Primary    Stable mostly unchanged DM still sub-optimal, A1c 8.7 (from 8.5) No hypoglycemia Complications - peripheral neuropathy L>R Failed: Victoza (cost), Bydureon (local reaction, knot) - Tyler Aas (cost) Contraindicated SGLT2 (bladder cancer)  Plan:  1. Again reviewed adjusting DM regimen today - he was not able to afford [place]. And did not change Lantus dosing. - May try more potent GLP1 agent with Ozempic, advised him to check cost/coverage next - Lastly if not able to control sugar with lifestyle, or other GLP1, we  may have limited options and we discussed possibility of future Endocrinology referral for consider Insulin Pump, since patient is already on basal + mealtime insulin may have more precise control 2. Continue Humalog to 8-10u BID wc (avoid skipped doses to improve control) 3. Continue Metformin 1000mg  BID, Dulaglutide 1.5mg  weekly 4. Encourage improved lifestyle - low carb, low sugar diet, reduce portion size, continue improving regular exercise 5. Check CBG , bring log to next visit for review 6. Continue ARB - consider ASA, Statin in future - will check lipids with annual and discuss 7. Advised to schedule DM ophtho exam [facility] in [place] to go 2019), send record 8. Follow-up 3 months Annual + labs A1c         Meds ordered this encounter  Medications  . traMADol (ULTRAM) 50 MG tablet    Sig: Take 1 tablet (50 mg total) every 8 (eight) hours as needed by mouth.  Dispense:  30 tablet    Refill:  2     Follow up plan: Return in about 3 months (around 01/04/2018) for Annual Physical.  Future labs ordered for 01/12/18  [name], [facility] 10/04/2017, 9:03 AM

## 2017-10-04 NOTE — Assessment & Plan Note (Signed)
Stable mostly unchanged DM still sub-optimal, A1c 8.7 (from 8.5) No hypoglycemia Complications - peripheral neuropathy L>R Failed: Victoza (cost), Bydureon (local reaction, knot) - [place] Aas (cost) Contraindicated SGLT2 (bladder cancer)  Plan:  1. Again reviewed adjusting DM regimen today - he was not able to afford [place]. And did not change Lantus dosing. - May try more potent GLP1 agent with Ozempic, advised him to check cost/coverage next - Lastly if not able to control sugar with lifestyle, or other GLP1, we may have limited options and we discussed possibility of future Endocrinology referral for consider Insulin Pump, since patient is already on basal + mealtime insulin may have more precise control 2. Continue Humalog to 8-10u BID wc (avoid skipped doses to improve control) 3. Continue Metformin 1000mg  BID, Dulaglutide 1.5mg  weekly 4. Encourage improved lifestyle - low carb, low sugar diet, reduce portion size, continue improving regular exercise 5. Check CBG , bring log to next visit for review 6. Continue ARB - consider ASA, Statin in future - will check lipids with annual and discuss 7. Advised to schedule DM ophtho exam [facility] in [place] to go 2019), send record 8. Follow-up 3 months Annual + labs A1c

## 2017-11-29 ENCOUNTER — Other Ambulatory Visit: Payer: Self-pay

## 2017-11-29 MED ORDER — ALBUTEROL SULFATE HFA 108 (90 BASE) MCG/ACT IN AERS: 1.0000 | INHALATION_SPRAY | RESPIRATORY_TRACT | 12 refills | Status: DC | PRN

## 2017-11-29 MED ORDER — INSULIN PEN NEEDLE 31G X 8 MM MISC: 0 refills | Status: DC

## 2017-12-06 ENCOUNTER — Other Ambulatory Visit: Payer: Self-pay

## 2017-12-06 DIAGNOSIS — E1140 Type 2 diabetes mellitus with diabetic neuropathy, unspecified: Secondary | ICD-10-CM

## 2017-12-06 DIAGNOSIS — E1165 Type 2 diabetes mellitus with hyperglycemia: Principal | ICD-10-CM

## 2017-12-06 DIAGNOSIS — IMO0002 Reserved for concepts with insufficient information to code with codable children: Secondary | ICD-10-CM

## 2017-12-06 MED ORDER — "INSULIN SYRINGE-NEEDLE U-100 31G X 5/16"" 1 ML MISC": 100.0000 | Freq: Two times a day (BID) | 11 refills | Status: DC

## 2018-01-10 ENCOUNTER — Other Ambulatory Visit: Payer: BLUE CROSS/BLUE SHIELD

## 2018-01-10 DIAGNOSIS — Z1159 Encounter for screening for other viral diseases: Secondary | ICD-10-CM

## 2018-01-10 DIAGNOSIS — E1169 Type 2 diabetes mellitus with other specified complication: Secondary | ICD-10-CM

## 2018-01-10 DIAGNOSIS — I10 Essential (primary) hypertension: Secondary | ICD-10-CM

## 2018-01-10 DIAGNOSIS — E1165 Type 2 diabetes mellitus with hyperglycemia: Secondary | ICD-10-CM

## 2018-01-10 DIAGNOSIS — Z8551 Personal history of malignant neoplasm of bladder: Secondary | ICD-10-CM

## 2018-01-10 DIAGNOSIS — E1140 Type 2 diabetes mellitus with diabetic neuropathy, unspecified: Secondary | ICD-10-CM

## 2018-01-10 DIAGNOSIS — E785 Hyperlipidemia, unspecified: Secondary | ICD-10-CM

## 2018-01-10 DIAGNOSIS — IMO0002 Reserved for concepts with insufficient information to code with codable children: Secondary | ICD-10-CM

## 2018-01-11 LAB — CBC WITH DIFFERENTIAL/PLATELET
BASOS PCT: 1.3 %
Basophils Absolute: 92 cells/uL (ref 0–200)
EOS ABS: 341 {cells}/uL (ref 15–500)
Eosinophils Relative: 4.8 %
HEMATOCRIT: 44.2 % (ref 38.5–50.0)
HEMOGLOBIN: 14.7 g/dL (ref 13.2–17.1)
LYMPHS ABS: 1555 {cells}/uL (ref 850–3900)
MCH: 27.9 pg (ref 27.0–33.0)
MCHC: 33.3 g/dL (ref 32.0–36.0)
MCV: 83.9 fL (ref 80.0–100.0)
MPV: 10.1 fL (ref 7.5–12.5)
Monocytes Relative: 10.9 %
NEUTROS ABS: 4338 {cells}/uL (ref 1500–7800)
Neutrophils Relative %: 61.1 %
Platelets: 231 10*3/uL (ref 140–400)
RBC: 5.27 10*6/uL (ref 4.20–5.80)
RDW: 12.4 % (ref 11.0–15.0)
Total Lymphocyte: 21.9 %
WBC mixed population: 774 cells/uL (ref 200–950)
WBC: 7.1 10*3/uL (ref 3.8–10.8)

## 2018-01-11 LAB — COMPLETE METABOLIC PANEL WITH GFR
AG RATIO: 2 (calc) (ref 1.0–2.5)
ALT: 22 U/L (ref 9–46)
AST: 17 U/L (ref 10–35)
Albumin: 3.9 g/dL (ref 3.6–5.1)
Alkaline phosphatase (APISO): 56 U/L (ref 40–115)
BUN: 13 mg/dL (ref 7–25)
CALCIUM: 8.8 mg/dL (ref 8.6–10.3)
CO2: 29 mmol/L (ref 20–32)
Chloride: 102 mmol/L (ref 98–110)
Creat: 1 mg/dL (ref 0.70–1.25)
GFR, EST NON AFRICAN AMERICAN: 78 mL/min/{1.73_m2} (ref >=60)
GFR, Est African American: 90 mL/min/{1.73_m2} (ref >=60)
GLOBULIN: 2 g/dL (ref 1.9–3.7)
Glucose, Bld: 145 mg/dL — ABNORMAL HIGH (ref 65–99)
POTASSIUM: 4.2 mmol/L (ref 3.5–5.3)
SODIUM: 138 mmol/L (ref 135–146)
Total Bilirubin: 0.5 mg/dL (ref 0.2–1.2)
Total Protein: 5.9 g/dL — ABNORMAL LOW (ref 6.1–8.1)

## 2018-01-11 LAB — LIPID PANEL
CHOL/HDL RATIO: 4.8 (calc) (ref <5.0)
Cholesterol: 169 mg/dL (ref <200)
HDL: 35 mg/dL — AB (ref >40)
LDL CHOLESTEROL (CALC): 112 mg/dL — AB
NON-HDL CHOLESTEROL (CALC): 134 mg/dL — AB (ref <130)
Triglycerides: 116 mg/dL (ref <150)

## 2018-01-11 LAB — HEMOGLOBIN A1C
EAG (MMOL/L): 11.6 (calc)
Hgb A1c MFr Bld: 8.9 % of total Hgb — ABNORMAL HIGH (ref <5.7)
Mean Plasma Glucose: 209 (calc)

## 2018-01-11 LAB — HEPATITIS C ANTIBODY
Hepatitis C Ab: NONREACTIVE
SIGNAL TO CUT-OFF: 0.01 (ref <1.00)

## 2018-01-12 ENCOUNTER — Other Ambulatory Visit: Payer: BLUE CROSS/BLUE SHIELD

## 2018-01-17 ENCOUNTER — Ambulatory Visit (INDEPENDENT_AMBULATORY_CARE_PROVIDER_SITE_OTHER): Payer: BLUE CROSS/BLUE SHIELD | Admitting: Family Medicine

## 2018-01-17 ENCOUNTER — Encounter: Payer: Self-pay | Admitting: Family Medicine

## 2018-01-17 VITALS — BP 138/78 | HR 79 | Temp 98.2°F | Resp 16 | Ht 70.0 in | Wt 244.8 lb

## 2018-01-17 DIAGNOSIS — E1165 Type 2 diabetes mellitus with hyperglycemia: Secondary | ICD-10-CM | POA: Diagnosis not present

## 2018-01-17 DIAGNOSIS — R451 Restlessness and agitation: Secondary | ICD-10-CM

## 2018-01-17 DIAGNOSIS — I10 Essential (primary) hypertension: Secondary | ICD-10-CM | POA: Diagnosis not present

## 2018-01-17 DIAGNOSIS — C671 Malignant neoplasm of dome of bladder: Secondary | ICD-10-CM | POA: Diagnosis not present

## 2018-01-17 DIAGNOSIS — M159 Polyosteoarthritis, unspecified: Secondary | ICD-10-CM

## 2018-01-17 DIAGNOSIS — Z23 Encounter for immunization: Secondary | ICD-10-CM | POA: Diagnosis not present

## 2018-01-17 DIAGNOSIS — Z0001 Encounter for general adult medical examination with abnormal findings: Secondary | ICD-10-CM

## 2018-01-17 DIAGNOSIS — E1140 Type 2 diabetes mellitus with diabetic neuropathy, unspecified: Secondary | ICD-10-CM

## 2018-01-17 DIAGNOSIS — Z0000 Encounter for general adult medical examination without abnormal findings: Secondary | ICD-10-CM

## 2018-01-17 DIAGNOSIS — M8949 Other hypertrophic osteoarthropathy, multiple sites: Secondary | ICD-10-CM

## 2018-01-17 DIAGNOSIS — IMO0002 Reserved for concepts with insufficient information to code with codable children: Secondary | ICD-10-CM

## 2018-01-17 DIAGNOSIS — C61 Malignant neoplasm of prostate: Secondary | ICD-10-CM

## 2018-01-17 DIAGNOSIS — J432 Centrilobular emphysema: Secondary | ICD-10-CM | POA: Diagnosis not present

## 2018-01-17 DIAGNOSIS — M150 Primary generalized (osteo)arthritis: Secondary | ICD-10-CM | POA: Diagnosis not present

## 2018-01-17 DIAGNOSIS — E669 Obesity, unspecified: Secondary | ICD-10-CM | POA: Insufficient documentation

## 2018-01-17 MED ORDER — METFORMIN HCL 1000 MG PO TABS: 1000.0000 mg | ORAL_TABLET | Freq: Two times a day (BID) | ORAL | 3 refills | Status: DC

## 2018-01-17 MED ORDER — AMLODIPINE BESYLATE 5 MG PO TABS: 5.0000 mg | ORAL_TABLET | Freq: Every day | ORAL | 3 refills | Status: DC

## 2018-01-17 MED ORDER — LOSARTAN POTASSIUM 50 MG PO TABS: 50.0000 mg | ORAL_TABLET | Freq: Every day | ORAL | 3 refills | Status: DC

## 2018-01-17 MED ORDER — FLUOXETINE HCL 40 MG PO CAPS: 40.0000 mg | ORAL_CAPSULE | Freq: Every day | ORAL | 3 refills | Status: DC

## 2018-01-17 MED ORDER — TRAMADOL HCL 50 MG PO TABS: 50.0000 mg | ORAL_TABLET | Freq: Three times a day (TID) | ORAL | 2 refills | Status: DC | PRN

## 2018-01-17 MED ORDER — SEMAGLUTIDE(0.25 OR 0.5MG/DOS) 2 MG/1.5ML ~~LOC~~ SOPN: 0.5000 mg | PEN_INJECTOR | SUBCUTANEOUS | 2 refills | Status: DC

## 2018-01-17 NOTE — Assessment & Plan Note (Signed)
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 Imaging with bone scan 2008 for cancer found reduced cartilage on joints No recent X-rays Improved on Ibuprofen and Tramadol in past  Plan: 1. Caution Ibuprofen - use more Tylenol PRN - Agree refill Tramadol 50mg  q 8 hr PRN #30 +2 refills sent e script for a 3 month supply, due to risk of affecting kidney on high dose ibuprofen, and will help keep patient functional to keep working now - use only PRN flare  - [place] Controlled Substance Agreement printed and signed - Defer UDS today - Checked PMP Aware [place] CSRS appropriate w/o red flags - Future consider topical Diclofenac PRN Follow-up 3 months re-eval consider steroid inj knee/shoulders, future PT, ortho as need  Gave sample of chondroitin for joints

## 2018-01-17 NOTE — Progress Notes (Signed)
Subjective:    Patient ID: Henry Horne, male    DOB: [birth year], 67 y.o.   MRN: [id number]  Henry Horne is a 67 y.o. male presenting on 01/17/2018 for Annual Exam and Diabetes   HPI   Here for Annual Physical and Lab Review  CHRONIC DM, Type 2 with history of DM Neuropathy Reports concern with mild elevated A1c up to 8.9 now, he attributes to diet CBGs:does not have cbg results today but reports avg CBG still >150 up to 180s, high >200, no significant hypoglycemia < 100 Meds: Lantus 64u nightly (switched dosing to split AM/PM - then did not adhere to it so switched back to PM dosing), Dulaglutide 1.5mg  [place] weekly, Metformin 1000mg  BID - Discontinued Humalog - History of Victoza in past on diabetic program through prior insurance. Failed Bydureon (had local reaction knots) Reports good compliance. Tolerating well w/o side-effects Currently on ARB Lifestyle: - Diet (still poor diet, admits ice cream, muffins, carbs and sweets, he admits need to improve) - Exercise (active walking - recent less with winter weather) - Due for DM Eye exam at [facility] in [place], states he will go in 2019 - Prior history of nerve testing L>R reduced sensation  Chronic Pain / osteoarthritis multiple joints - Reports chronic intermittent pain in multiple joints, primarily Right lower extremity R knee, and feet, R>L, with aching pain including R foot arch, improved with new pair of shoes, and has been using compression sock now with significant improvement - He is doing well since last visit on intermittent Tramadol, not taking daily, usually takes 1-3 per days with flare up, then can skip 3-4 days or more up to 1 week without pills, due for refill today - Taking some Tylenol limited relief - Taking Ibuprofen 800mg  occasionally with some relief - Prior imaging with bone scan 12/2006 for eval of prostate/bladder cancer, also identified early joint cartilage wear in knees and other joints. No  recent X-ray imaging  CHRONIC HTN: Reports not checking BP outside office Current Meds - Losartan 50mg  daily, Amodipine 5mg  daily   Reports good compliance, took meds today. Tolerating well, w/o complaints.  HYPERLIPIDEMIA / Obesity BMI >35 - Reports no concerns. Last lipid panel 12/2017, improved mostly controlled, improved TC, HDL, LDL, TG despite limited improve lifestyle - Not on statin therapy due to intolerance. Not interested in repatha or other newer agents due to cost - taking Garlic capsule supplement twice daily  COPD / Emphysema Chronic problem former smoker. Uses advair not regularly has albuterol PRN No recent flares  Health Maintenance: UTD Flu vaccine 08/2017 Due TDap will get today UTD Colonoscopy Negative Hep C screening on recent lab Prostate CA history followed by Urology - has been stable per Dr Jacqlyn Larsen, last visit 04/2017  Depression screen [facility] 2/9 01/17/2018 10/04/2017 02/14/2017  Decreased Interest 0 0 0  Down, Depressed, Hopeless 0 0 0  PHQ - 2 Score 0 0 0  Altered sleeping 0 - -  Tired, decreased energy 0 - -  Change in appetite 0 - -  Feeling bad or failure about yourself  0 - -  Trouble concentrating 0 - -  Moving slowly or fidgety/restless 0 - -  Suicidal thoughts 0 - -  PHQ-9 Score 0 - -  Difficult doing work/chores Not difficult at all - -    Past Medical History:  Diagnosis Date  . Anxiety   . Cancer of kidney ([place]) 2010   cystoscopy 2010, 2014, 2015, 2016- MD  q 6 months  . Depression   . Emphysema of lung ([place]) 1996  . Hyperlipidemia   . Hypertension   . Joint pain   . Prostate cancer [facility]) 2008   removed with cryotherapy   Past Surgical History:  Procedure Laterality Date  . DIGIT NAIL REMOVAL Left 2016   left and right foot -toe nails   Social History   Socioeconomic History  . Marital status: Married    Spouse name: Butch Penny  . Number of children: Not on file  . Years of education: Not on file  . Highest education level: Not on  file  Social Needs  . Financial resource strain: Not on file  . Food insecurity - worry: Not on file  . Food insecurity - inability: Not on file  . Transportation needs - medical: Not on file  . Transportation needs - non-medical: Not on file  Occupational History  . Not on file  Tobacco Use  . Smoking status: Former Smoker    Years: 30.00    Last attempt to quit: 05/18/2004    Years since quitting: 13.6  . Smokeless tobacco: Never Used  Substance and Sexual Activity  . Alcohol use: No    Alcohol/week: 0.0 oz  . Drug use: No  . Sexual activity: Not on file  Other Topics Concern  . Not on file  Social History Narrative  . Not on file   Family History  Problem Relation Age of Onset  . Diabetes Mother   . Heart disease Father   . Cancer Father   . Hyperlipidemia Father   . Hypertension Father   . Diabetes Brother   . COPD Brother   . Autoimmune disease Brother   . Heart disease Brother   . Diabetes Brother    Current Outpatient Medications on File Prior to Visit  Medication Sig  . albuterol (PROAIR HFA) 108 (90 Base) MCG/ACT inhaler Inhale 1-2 puffs into the lungs every 4 (four) hours as needed for wheezing or shortness of breath.  . Fluticasone-Salmeterol (ADVAIR DISKUS) 250-50 MCG/DOSE AEPB Inhale 1 puff into the lungs 2 (two) times daily.  . Garlic Oil 2 MG CAPS Take 1,000 mg by mouth 2 (two) times daily. Reported on 11/17/2015  . glucose blood (TRUETEST TEST) test strip 1 each by Other route 2 (two) times daily. Use as instructed  . ibuprofen (ADVIL,MOTRIN) 800 MG tablet Take 1 tablet (800 mg total) by mouth 3 (three) times daily as needed.  Marland Kitchen imipramine (TOFRANIL) 10 MG tablet Take 1 tablet (10 mg total) by mouth at bedtime.  . Insulin Pen Needle (NOVOFINE) 30G X 8 MM MISC Use 1 nweedle to inject Victoza once daily.  . Insulin Pen Needle 31G X 5 MM MISC 1 each by Does not apply route 3 (three) times daily.  . Insulin Pen Needle 31G X 8 MM MISC Use as directed  .  Insulin Syringe-Needle U-100 31G X 5/16" 1 ML MISC 100 each by Does not apply route 2 (two) times daily.  . TRUEPLUS LANCETS 30G MISC Use 1 lancet  to check Blood sugar twice daily  . LANTUS 100 UNIT/ML injection    No current facility-administered medications on file prior to visit.     Review of Systems  Constitutional: Negative for activity change, appetite change, chills, diaphoresis, fatigue, fever and unexpected weight change.  HENT: Negative for congestion and hearing loss.   Eyes: Negative for visual disturbance.  Respiratory: Negative for apnea, cough, choking, chest tightness, shortness of  breath and wheezing.   Cardiovascular: Negative for chest pain, palpitations and leg swelling.  Gastrointestinal: Negative for abdominal pain, anal bleeding, blood in stool, constipation, diarrhea, nausea and vomiting.  Endocrine: Negative for cold intolerance and polyuria.  Genitourinary: Negative for decreased urine volume, difficulty urinating, dysuria, frequency, hematuria, testicular pain and urgency.  Musculoskeletal: Positive for arthralgias. Negative for neck pain.  Skin: Negative for rash.  Allergic/Immunologic: Negative for environmental allergies.  Neurological: Negative for dizziness, weakness, light-headedness, numbness and headaches.  Hematological: Negative for adenopathy.  Psychiatric/Behavioral: Negative for behavioral problems, dysphoric mood and sleep disturbance. The patient is not nervous/anxious.    Per HPI unless specifically indicated above     Objective:    BP 138/78 (BP Location: Left Arm, Cuff Size: Normal)   Pulse 79   Temp 98.2 F (36.8 C) (Oral)   Resp 16   Ht 5\' 10"  (1.778 m)   Wt 244 lb 12.8 oz (111 kg)   BMI 35.13 kg/m   Wt Readings from Last 3 Encounters:  01/17/18 244 lb 12.8 oz (111 kg)  10/04/17 241 lb (109.3 kg)  06/29/17 239 lb (108.4 kg)    Physical Exam  Constitutional: He is oriented to person, place, and time. He appears well-developed  and well-nourished. No distress.  Well-appearing, comfortable, cooperative, obese  HENT:  Head: Normocephalic and atraumatic.  Mouth/Throat: Oropharynx is clear and moist.  Eyes: Conjunctivae and EOM are normal. Pupils are equal, round, and reactive to light. Right eye exhibits no discharge. Left eye exhibits no discharge.  Neck: Normal range of motion. Neck supple.  Cardiovascular: Normal rate, regular rhythm, normal heart sounds and intact distal pulses.  No murmur heard. Pulmonary/Chest: Effort normal and breath sounds normal. No respiratory distress. He has no wheezes. He has no rales.  Abdominal: Soft. Bowel sounds are normal. He exhibits no distension and no mass. There is no tenderness.  Musculoskeletal: Normal range of motion. He exhibits no edema or tenderness.  Upper / Lower Extremities: - Normal muscle tone, strength bilateral upper extremities 5/5, lower extremities 5/5  Neurological: He is alert and oriented to person, place, and time.  Distal sensation intact to light touch all extremities  Skin: Skin is warm and dry. No rash noted. He is not diaphoretic. No erythema.  Psychiatric: He has a normal mood and affect. His behavior is normal.  Well groomed, good eye contact, normal speech and thoughts  Nursing note and vitals reviewed.  Results for orders placed or performed in visit on 01/10/18  Hepatitis C antibody  Result Value Ref Range   Hepatitis C Ab NON-REACTIVE NON-REACTI   SIGNAL TO CUT-OFF 0.01 <1.00  CBC with Differential/Platelet  Result Value Ref Range   WBC 7.1 3.8 - 10.8 Thousand/uL   RBC 5.27 4.20 - 5.80 Million/uL   Hemoglobin 14.7 13.2 - 17.1 g/dL   HCT 44.2 38.5 - 50.0 %   MCV 83.9 80.0 - 100.0 fL   MCH 27.9 27.0 - 33.0 pg   MCHC 33.3 32.0 - 36.0 g/dL   RDW 12.4 11.0 - 15.0 %   Platelets 231 140 - 400 Thousand/uL   MPV 10.1 7.5 - 12.5 fL   Neutro Abs 4,338 1,500 - 7,800 cells/uL   Lymphs Abs 1,555 850 - 3,900 cells/uL   WBC mixed population 774 200  - 950 cells/uL   Eosinophils Absolute 341 15 - 500 cells/uL   Basophils Absolute 92 0 - 200 cells/uL   Neutrophils Relative % 61.1 %   Total Lymphocyte  21.9 %   Monocytes Relative 10.9 %   Eosinophils Relative 4.8 %   Basophils Relative 1.3 %  Hemoglobin A1c  Result Value Ref Range   Hgb A1c MFr Bld 8.9 (H) <5.7 % of total Hgb   Mean Plasma Glucose 209 (calc)   eAG (mmol/L) 11.6 (calc)  Lipid panel  Result Value Ref Range   Cholesterol 169 <200 mg/dL   HDL 35 (L) >40 mg/dL   Triglycerides 116 <150 mg/dL   LDL Cholesterol (Calc) 112 (H) mg/dL (calc)   Total CHOL/HDL Ratio 4.8 <5.0 (calc)   Non-HDL Cholesterol (Calc) 134 (H) <130 mg/dL (calc)  COMPLETE METABOLIC PANEL WITH GFR  Result Value Ref Range   Glucose, Bld 145 (H) 65 - 99 mg/dL   BUN 13 7 - 25 mg/dL   Creat 1.00 0.70 - 1.25 mg/dL   GFR, Est Non African American 78 > OR = 60 mL/min/1.44m2   GFR, Est African American 90 > OR = 60 mL/min/1.100m2   BUN/Creatinine Ratio NOT APPLICABLE 6 - 22 (calc)   Sodium 138 135 - 146 mmol/L   Potassium 4.2 3.5 - 5.3 mmol/L   Chloride 102 98 - 110 mmol/L   CO2 29 20 - 32 mmol/L   Calcium 8.8 8.6 - 10.3 mg/dL   Total Protein 5.9 (L) 6.1 - 8.1 g/dL   Albumin 3.9 3.6 - 5.1 g/dL   Globulin 2.0 1.9 - 3.7 g/dL (calc)   AG Ratio 2.0 1.0 - 2.5 (calc)   Total Bilirubin 0.5 0.2 - 1.2 mg/dL   Alkaline phosphatase (APISO) 56 40 - 115 U/L   AST 17 10 - 35 U/L   ALT 22 9 - 46 U/L      Assessment & Plan:   Problem List Items Addressed This Visit    Agitation    Stable well controlled Refill Fluoxetine      Relevant Medications   FLUoxetine (PROZAC) 40 MG capsule   Emphysema of lung (HCC)    History of COPD / emphysema Former smoker On Advair, not using regularly Has Albuterol PRN No recent flares      Essential hypertension    Mildly elevated initial BP, repeat manual check improved. - Home BP readings limited readings  No known complications    Plan:  1. Continue current BP  regimen - Losartan 50mg  daily, Amodipine 5mg  daily 2. Encourage improved lifestyle - low sodium diet, improve regular exercise 3. Start monitor BP outside office, bring readings to next visit, if persistently >140/90 or new symptoms notify office sooner 4. 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 PSA monitoring Followed by Dr Jacqlyn Larsen [facility]      Morbid obesity [facility])    Considered morbid obesity with BMI >35 due to comorbid conditions DM, HTN, HLD Encouraged weight loss and lifestyle intervention diet / exercise - focus on improve low carb diet, reduce portions sweets Switch GLP from Trulicity to Ozempic, goal to increase wt loss on med and control A1c Goal wt loss up to 1 lb weekly Follow-up 3 months      Relevant Medications  LANTUS 100 UNIT/ML injection   Semaglutide (OZEMPIC) 0.25 or 0.5 MG/DOSE SOPN   metFORMIN (GLUCOPHAGE) 1000 MG tablet   Osteoarthritis of multiple joints    Suspected multiple joint osteoarthritis with some chronic pain, worse with labor and repetitive activity Affecting knees, feet, and other joints. Imaging with bone scan 2008 for cancer found reduced cartilage on joints No recent X-rays Improved on Ibuprofen and Tramadol in past  Plan: 1. Caution Ibuprofen - use more Tylenol PRN - Agree refill Tramadol 50mg  q 8 hr PRN #30 +2 refills sent e script for a 3 month supply, due to risk of affecting kidney on high dose ibuprofen, and will help keep patient functional to keep working now - use only PRN flare  - [place] Controlled Substance Agreement printed and signed - Defer UDS today - Checked PMP  Aware [place] CSRS appropriate w/o red flags - Future consider topical Diclofenac PRN Follow-up 3 months re-eval consider steroid inj knee/shoulders, future PT, ortho as need  Gave sample of chondroitin for joints      Relevant Medications   traMADol (ULTRAM) 50 MG tablet   Uncontrolled type 2 diabetes mellitus with diabetic nephropathy, with long-term current use of insulin (HCC)    Still uncontrolled DM control A1c 8.9 from 8.7 No hypoglycemia Complications - peripheral neuropathy L>R Failed: Victoza (cost), Bydureon (local reaction, knot) - Tresiba (cost) Contraindicated SGLT2 (bladder cancer)  Plan:  1. Discussed once more more potent Ozempic GLP1 alternative to Trulicity - agree to proceed with new rx, he has BCBS and should be covered by his report, manufacturer coupon card given for reduced copay - DISCONTINUE Trulicity - Start Ozempic injection pen 0.25mg  weekly for 4 weeks then inc to 0.5mg  weekly for 2 months until return, rx sent to pharmacy, demo pen reviewed in office, he has pen needles already - Continue Lantus 64u nightly - will need to titrate down pending CBGs in future if controlled on Ozempic - He has discontinued Humalog - Continue Metformin 1000mg  BID - refilled 2. Encourage improved lifestyle - low carb, low sugar diet, reduce portion size, continue improving regular exercise 3. Check CBG , bring log to next visit for review 4. Continue ARB - consider ASA, Statin in future again or alternative PSK9 inhib - he has declined due to statin intolerance 5. Advised to schedule DM ophtho exam [facility] in [place] to go 2019), send record 6. Follow-up 3 months DM A1c  future consider Endocrinology referral for consider Insulin Pump, since patient is already on basal and prior mealtime insulin may have more precise control if we cannot improve, although suspect dietary is largest factor for sub optimal control      Relevant Medications   LANTUS 100 UNIT/ML  injection   Semaglutide (OZEMPIC) 0.25 or 0.5 MG/DOSE SOPN   losartan (COZAAR) 50 MG tablet   metFORMIN (GLUCOPHAGE) 1000 MG tablet    Other Visit Diagnoses    Annual physical exam    -  Primary   Need for diphtheria-tetanus-pertussis (Tdap) vaccine       Relevant Orders   Tdap vaccine greater than or equal to 7yo IM (Completed)      Meds ordered this encounter  Medications  . Semaglutide (OZEMPIC) 0.25 or 0.5 MG/DOSE SOPN    Sig: Inject 0.5 mg into the skin once a week. For first 4 weeks, inject dose 0.25mg  weekly    Dispense:  1 pen    Refill:  2  . losartan (COZAAR) 50 MG tablet  Sig: Take 1 tablet (50 mg total) by mouth daily.    Dispense:  90 tablet    Refill:  3  . metFORMIN (GLUCOPHAGE) 1000 MG tablet    Sig: Take 1 tablet (1,000 mg total) by mouth 2 (two) times daily.    Dispense:  180 tablet    Refill:  3  . FLUoxetine (PROZAC) 40 MG capsule    Sig: Take 1 capsule (40 mg total) by mouth daily.    Dispense:  90 capsule    Refill:  3  . amLODipine (NORVASC) 5 MG tablet    Sig: Take 1 tablet (5 mg total) by mouth daily.    Dispense:  90 tablet    Refill:  3  . traMADol (ULTRAM) 50 MG tablet    Sig: Take 1 tablet (50 mg total) by mouth every 8 (eight) hours as needed.    Dispense:  30 tablet    Refill:  2    Follow up plan: Return in about 3 months (around 04/16/2018) for DM A1c, HTN.  [name], [facility] 01/17/2018, 10:02 AM

## 2018-01-17 NOTE — Assessment & Plan Note (Signed)
Stable, recent cystoscopy. PSA monitoring Followed by Dr Jacqlyn Larsen [facility]

## 2018-01-17 NOTE — Assessment & Plan Note (Signed)
Stable, recent cystoscopy Followed by Dr Jacqlyn Larsen [facility] Considered SGLT2 med for DM but agree contraindicated with bladder cancer risk

## 2018-01-17 NOTE — Assessment & Plan Note (Signed)
Considered morbid obesity with BMI >35 due to comorbid conditions DM, HTN, HLD Encouraged weight loss and lifestyle intervention diet / exercise - focus on improve low carb diet, reduce portions sweets Switch GLP from Trulicity to Ozempic, goal to increase wt loss on med and control A1c Goal wt loss up to 1 lb weekly Follow-up 3 months

## 2018-01-17 NOTE — Patient Instructions (Addendum)
Thank you for coming to the office today.  1.  STOP Trulicity and switch to [employer] coupon  [employer] 0.25mg  injection weekly for 4 weeks then increase to 0.5mg  weekly - continue this dose for total of 2 months until you return we may increase to top dose 1mg  - this med has been proven to be more effective  Same potential side effects as trulicity  2.  Refilled Tramadol and other meds  Keep track of BP   Please schedule a Follow-up Appointment to: Return in about 3 months (around 04/16/2018) for DM A1c, HTN.    If you have any other questions or concerns, please feel free to call the office or send a message through [facility]. You may also schedule an earlier appointment if necessary.  Additionally, you may be receiving a survey about your experience at our office within a few days to 1 week by e-mail or mail. We value your feedback.  [name], DO [place]

## 2018-01-17 NOTE — Assessment & Plan Note (Signed)
Mildly elevated initial BP, repeat manual check improved. - Home BP readings limited readings  No known complications    Plan:  1. Continue current BP regimen - Losartan 50mg  daily, Amodipine 5mg  daily 2. Encourage improved lifestyle - low sodium diet, improve regular exercise 3. Start monitor BP outside office, bring readings to next visit, if persistently >140/90 or new symptoms notify office sooner 4. Follow-up 3 months

## 2018-01-17 NOTE — Assessment & Plan Note (Signed)
Still uncontrolled DM control A1c 8.9 from 8.7 No hypoglycemia Complications - peripheral neuropathy L>R Failed: Victoza (cost), Bydureon (local reaction, knot) - Tyler Aas (cost) Contraindicated SGLT2 (bladder cancer)  Plan:  1. Discussed once more more potent Ozempic GLP1 alternative to Trulicity - agree to proceed with new rx, he has BCBS and should be covered by his report, manufacturer coupon card given for reduced copay - DISCONTINUE Trulicity - Start Ozempic injection pen 0.25mg  weekly for 4 weeks then inc to 0.5mg  weekly for 2 months until return, rx sent to pharmacy, demo pen reviewed in office, he has pen needles already - Continue Lantus 64u nightly - will need to titrate down pending CBGs in future if controlled on Ozempic - He has discontinued Humalog - Continue Metformin 1000mg  BID - refilled 2. Encourage improved lifestyle - low carb, low sugar diet, reduce portion size, continue improving regular exercise 3. Check CBG , bring log to next visit for review 4. Continue ARB - consider ASA, Statin in future again or alternative PSK9 inhib - he has declined due to statin intolerance 5. Advised to schedule DM ophtho exam [facility] in [place] to go 2019), send record 6. Follow-up 3 months DM A1c  future consider Endocrinology referral for consider Insulin Pump, since patient is already on basal and prior mealtime insulin may have more precise control if we cannot improve, although suspect dietary is largest factor for sub optimal control

## 2018-01-17 NOTE — Assessment & Plan Note (Signed)
Stable well controlled Refill Fluoxetine

## 2018-01-17 NOTE — Assessment & Plan Note (Signed)
>>  ASSESSMENT AND PLAN FOR HISTORY OF BLADDER CANCER WRITTEN ON 01/17/2018  9:59 AM BY Andersen Iorio J, DO  Stable, recent cystoscopy Followed by Dr Ike [facility] Considered SGLT2 med for DM but agree contraindicated with bladder cancer risk

## 2018-01-17 NOTE — Assessment & Plan Note (Signed)
History of COPD / emphysema Former smoker On Advair, not using regularly Has Albuterol PRN No recent flares

## 2018-02-10 ENCOUNTER — Other Ambulatory Visit: Payer: Self-pay | Admitting: Family Medicine

## 2018-02-10 DIAGNOSIS — E1165 Type 2 diabetes mellitus with hyperglycemia: Secondary | ICD-10-CM

## 2018-02-10 DIAGNOSIS — Z794 Long term (current) use of insulin: Principal | ICD-10-CM

## 2018-02-10 DIAGNOSIS — IMO0002 Reserved for concepts with insufficient information to code with codable children: Secondary | ICD-10-CM

## 2018-02-10 DIAGNOSIS — E1121 Type 2 diabetes mellitus with diabetic nephropathy: Secondary | ICD-10-CM

## 2018-03-09 ENCOUNTER — Other Ambulatory Visit: Payer: Self-pay

## 2018-03-09 ENCOUNTER — Encounter: Payer: Self-pay | Admitting: Emergency Medicine

## 2018-03-09 ENCOUNTER — Ambulatory Visit
Admission: EM | Admit: 2018-03-09 | Discharge: 2018-03-09 | Disposition: A | Discharge status: Home / Self Care | Payer: BLUE CROSS/BLUE SHIELD | Attending: Family Medicine | Admitting: Family Medicine

## 2018-03-09 DIAGNOSIS — R062 Wheezing: Secondary | ICD-10-CM

## 2018-03-09 DIAGNOSIS — R059 Cough, unspecified: Secondary | ICD-10-CM

## 2018-03-09 DIAGNOSIS — J40 Bronchitis, not specified as acute or chronic: Secondary | ICD-10-CM

## 2018-03-09 DIAGNOSIS — R05 Cough: Secondary | ICD-10-CM

## 2018-03-09 MED ORDER — PREDNISONE 20 MG PO TABS: 20.0000 mg | ORAL_TABLET | Freq: Every day | ORAL | 0 refills | Status: DC

## 2018-03-09 MED ORDER — HYDROCOD POLST-CPM POLST ER 10-8 MG/5ML PO SUER: 5.0000 mL | Freq: Two times a day (BID) | ORAL | 0 refills | Status: DC | PRN

## 2018-03-09 MED ORDER — DOXYCYCLINE HYCLATE 100 MG PO TABS: 100.0000 mg | ORAL_TABLET | Freq: Two times a day (BID) | ORAL | 0 refills | Status: DC

## 2018-03-09 NOTE — ED Provider Notes (Signed)
[facility]    CSN: [id number] Arrival date & time: 03/09/18  1653     History   Chief Complaint Chief Complaint  Patient presents with  . Cough    HPI Henry Horne is a 67 y.o. male.   The history is provided by the patient.  Cough  Associated symptoms: rhinorrhea   URI  Presenting symptoms: congestion, cough and rhinorrhea   Severity:  Moderate Onset quality:  Sudden Duration:  1 week Timing:  Constant Progression:  Worsening Chronicity:  New Relieved by:  Nothing Ineffective treatments:  OTC medications Risk factors: chronic respiratory disease, diabetes mellitus and sick contacts     Past Medical History:  Diagnosis Date  . Anxiety   . Cancer of kidney ([place]) 2010   cystoscopy 2010, 2014, 2015, 2016- MD q 6 months  . Depression   . Emphysema of lung ([place]) 1996  . Hyperlipidemia   . Hypertension   . Joint pain   . Prostate cancer [facility]) 2008   removed with cryotherapy    Patient Active Problem List   Diagnosis Date Noted  . Morbid obesity ([place]) 01/17/2018  . Osteoarthritis of multiple joints 10/04/2017  . Asthma 12/30/2016  . ACE-inhibitor cough 12/30/2016  . Agitation 06/16/2016  . Personal history of bladder cancer 05/12/2016  . Uncontrolled type 2 diabetes mellitus with diabetic nephropathy, with long-term current use of insulin ([place]) 12/25/2015  . Hyperlipidemia associated with type 2 diabetes mellitus ([place]) 11/02/2015  . Asymptomatic bacteriuria 10/08/2015  . Essential hypertension 07/29/2015  . Urinary obstruction, not elsewhere classified 06/19/2013  . Personal history of prostate cancer 12/19/2012  . Family history of malignant neoplasm of prostate 12/12/2012  . ED (erectile dysfunction) of organic origin 12/12/2012  . Incomplete bladder emptying 12/12/2012  . 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 Malignant neoplasm of prostate ([place]) 12/12/2012  . 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 Emphysema  of lung ([place]) 11/28/1994    Past Surgical History:  Procedure Laterality Date  . DIGIT NAIL REMOVAL Left 2016   left and right foot -toe nails       Home Medications    Prior to Admission medications   Medication Sig Start Date End Date Taking? Authorizing Provider  albuterol (PROAIR HFA) 108 (90 Base) MCG/ACT inhaler Inhale 1-2 puffs into the lungs every 4 (four) hours as needed for wheezing or shortness of breath. 11/29/17  Yes Karamalegos, Devonne Doughty, DO  amLODipine (NORVASC) 5 MG tablet Take 1 tablet (5 mg total) by mouth daily. 01/17/18  Yes Karamalegos, Devonne Doughty, DO  FLUoxetine (PROZAC) 40 MG capsule Take 1 capsule (40 mg total) by mouth daily. 01/17/18  Yes Karamalegos, Devonne Doughty, DO  Fluticasone-Salmeterol (ADVAIR DISKUS) 250-50 MCG/DOSE AEPB Inhale 1 puff into the lungs 2 (two) times daily. 10/03/16  Yes Arlis Porta., MD  Insulin Pen Needle (NOVOFINE) 30G X 8 MM MISC Use 1 nweedle to inject Victoza once daily. 10/03/16  Yes Arlis Porta., MD  LANTUS 100 UNIT/ML injection INJECT 64 UNITS SUBCUTANEOUSLY AT BEDTIME OR MAY SPLIT DOSE FOR 30-32 UNITS TWICE DAILY 02/11/18  Yes Karamalegos, Devonne Doughty, DO  losartan (COZAAR) 50 MG tablet Take 1 tablet (50 mg total) by mouth daily. 01/17/18  Yes Karamalegos, Devonne Doughty, DO  traMADol (ULTRAM) 50 MG tablet Take 1 tablet (50 mg total) by mouth every 8 (eight) hours as needed. 01/17/18  Yes Karamalegos, Devonne Doughty, DO  chlorpheniramine-HYDROcodone (TUSSIONEX PENNKINETIC ER) 10-8 MG/5ML Latanya Presser  Take 5 mLs by mouth every 12 (twelve) hours as needed. 03/09/18   Norval Gable, MD  doxycycline (VIBRA-TABS) 100 MG tablet Take 1 tablet (100 mg total) by mouth 2 (two) times daily. 03/09/18   Norval Gable, MD  Garlic Oil 2 MG CAPS Take 1,000 mg by mouth 2 (two) times daily. Reported on 11/17/2015 12/12/12   [provider]  glucose blood (TRUETEST TEST) test strip 1 each by Other route 2 (two) times daily. Use as instructed 10/03/16    Arlis Porta., MD  ibuprofen (ADVIL,MOTRIN) 800 MG tablet Take 1 tablet (800 mg total) by mouth 3 (three) times daily as needed. 10/03/16   Arlis Porta., MD  imipramine (TOFRANIL) 10 MG tablet Take 1 tablet (10 mg total) by mouth at bedtime. 02/14/17   Arlis Porta., MD  Insulin Pen Needle 31G X 5 MM MISC 1 each by Does not apply route 3 (three) times daily. 10/03/16   Arlis Porta., MD  Insulin Pen Needle 31G X 8 MM MISC Use as directed 11/29/17   Olin Hauser, DO  Insulin Syringe-Needle U-100 31G X 5/16" 1 ML MISC 100 each by Does not apply route 2 (two) times daily. 12/06/17   Karamalegos, Devonne Doughty, DO  metFORMIN (GLUCOPHAGE) 1000 MG tablet Take 1 tablet (1,000 mg total) by mouth 2 (two) times daily. 01/17/18   Karamalegos, Devonne Doughty, DO  predniSONE (DELTASONE) 20 MG tablet Take 1 tablet (20 mg total) by mouth daily. 03/09/18   Norval Gable, MD  Semaglutide (OZEMPIC) 0.25 or 0.5 MG/DOSE SOPN Inject 0.5 mg into the skin once a week. For first 4 weeks, inject dose 0.25mg  weekly 01/17/18   Olin Hauser, DO  TRUEPLUS LANCETS 30G MISC Use 1 lancet  to check Blood sugar twice daily 10/03/16   Arlis Porta., MD    Family History Family History  Problem Relation Age of Onset  . Diabetes Mother   . Heart disease Father   . Cancer Father   . Hyperlipidemia Father   . Hypertension Father   . Diabetes Brother   . COPD Brother   . Autoimmune disease Brother   . Heart disease Brother   . Diabetes Brother     Social History Social History   Tobacco Use  . Smoking status: Former Smoker    Years: 30.00    Last attempt to quit: 05/18/2004    Years since quitting: 13.8  . Smokeless tobacco: Never Used  Substance Use Topics  . Alcohol use: No    Alcohol/week: 0.0 oz  . Drug use: No     Allergies   Zetia [ezetimibe]; Ace inhibitors; Pneumococcal vaccine; and Pneumococcal vaccines   Review of Systems Review of Systems  HENT:  Positive for congestion and rhinorrhea.   Respiratory: Positive for cough.      Physical Exam Triage Vital Signs ED Triage Vitals  Enc Vitals Group     BP 03/09/18 1713 (!) 162/87     Pulse --      Resp 03/09/18 1713 16     Temp 03/09/18 1713 99.1 F (37.3 C)     Temp Source 03/09/18 1713 Oral     SpO2 03/09/18 1713 96 %     Weight 03/09/18 1710 240 lb (108.9 kg)     Height 03/09/18 1710 5' 10.5" (1.791 m)     Head Circumference --      Peak Flow --  Pain Score 03/09/18 1710 6     Pain Loc --      Pain Edu? --      Excl. in [place]? --    No data found.  Updated Vital Signs BP (!) 162/87 (BP Location: Right Arm)   Temp 99.1 F (37.3 C) (Oral)   Resp 16   Ht 5' 10.5" (1.791 m)   Wt 240 lb (108.9 kg)   SpO2 96%   BMI 33.95 kg/m   Visual Acuity Right Eye Distance:   Left Eye Distance:   Bilateral Distance:    Right Eye Near:   Left Eye Near:    Bilateral Near:     Physical Exam  Constitutional: He appears well-developed and well-nourished. No distress.  HENT:  Head: Normocephalic and atraumatic.  Right Ear: Tympanic membrane, external ear and ear canal normal.  Left Ear: Tympanic membrane, external ear and ear canal normal.  Nose: Nose normal.  Mouth/Throat: Uvula is midline, oropharynx is clear and moist and mucous membranes are normal. No oropharyngeal exudate or tonsillar abscesses.  Eyes: Pupils are equal, round, and reactive to light. Conjunctivae and EOM are normal. Right eye exhibits no discharge. Left eye exhibits no discharge. No scleral icterus.  Neck: Normal range of motion. Neck supple. No tracheal deviation present. No thyromegaly present.  Cardiovascular: Normal rate, regular rhythm and normal heart sounds.  Pulmonary/Chest: Effort normal. No stridor. No respiratory distress. He has wheezes (diffuse and rhonchi). He has no rales. He exhibits no tenderness.  Lymphadenopathy:    He has no cervical adenopathy.  Neurological: He is alert.  Skin:  Skin is warm and dry. No rash noted. He is not diaphoretic.  Nursing note and vitals reviewed.    UC Treatments / Results  Labs (all labs ordered are listed, but only abnormal results are displayed) Labs Reviewed - No data to display  EKG None Radiology No results found.  Procedures Procedures (including critical care time)  Medications Ordered in UC Medications - No data to display   Initial Impression / Assessment and Plan / UC Course  I have reviewed the triage vital signs and the nursing notes.  Pertinent labs & imaging results that were available during my care of the patient were reviewed by me and considered in my medical decision making (see chart for details).       Final Clinical Impressions(s) / UC Diagnoses   Final diagnoses:  Cough  Wheezing  Bronchitis    ED Discharge Orders        Ordered    doxycycline (VIBRA-TABS) 100 MG tablet  2 times daily     03/09/18 1805    predniSONE (DELTASONE) 20 MG tablet  Daily     03/09/18 1805    chlorpheniramine-HYDROcodone (TUSSIONEX PENNKINETIC ER) 10-8 MG/5ML SUER  Every 12 hours PRN     03/09/18 1808     1. diagnosis reviewed with patient 2. rx as per orders above; reviewed possible side effects, interactions, risks and benefits  3. Recommend supportive treatment with rest, fluids 4. Follow-up prn if symptoms worsen or don't improve  Controlled Substance Prescriptions [place] Controlled Substance Registry consulted? Not Applicable   Norval Gable, MD 03/09/18 [phone]

## 2018-03-09 NOTE — ED Triage Notes (Signed)
Patient c/o cough, congestion, bodyaches, and sinus congestion for 4-5 days.

## 2018-03-29 LAB — HM DIABETES EYE EXAM

## 2018-03-30 ENCOUNTER — Other Ambulatory Visit: Payer: Self-pay

## 2018-03-30 DIAGNOSIS — R42 Dizziness and giddiness: Secondary | ICD-10-CM

## 2018-03-30 MED ORDER — IMIPRAMINE HCL 10 MG PO TABS: 10.0000 mg | ORAL_TABLET | Freq: Every day | ORAL | 3 refills | Status: DC

## 2018-04-18 ENCOUNTER — Encounter: Payer: Self-pay | Admitting: Family Medicine

## 2018-04-18 ENCOUNTER — Ambulatory Visit (INDEPENDENT_AMBULATORY_CARE_PROVIDER_SITE_OTHER): Payer: BLUE CROSS/BLUE SHIELD | Admitting: Family Medicine

## 2018-04-18 VITALS — BP 138/68 | HR 72 | Temp 98.7°F | Resp 16 | Ht 70.0 in | Wt 239.0 lb

## 2018-04-18 DIAGNOSIS — E1142 Type 2 diabetes mellitus with diabetic polyneuropathy: Secondary | ICD-10-CM | POA: Diagnosis not present

## 2018-04-18 DIAGNOSIS — E1165 Type 2 diabetes mellitus with hyperglycemia: Secondary | ICD-10-CM | POA: Diagnosis not present

## 2018-04-18 DIAGNOSIS — M150 Primary generalized (osteo)arthritis: Secondary | ICD-10-CM | POA: Diagnosis not present

## 2018-04-18 DIAGNOSIS — M25562 Pain in left knee: Secondary | ICD-10-CM | POA: Diagnosis not present

## 2018-04-18 DIAGNOSIS — M25561 Pain in right knee: Secondary | ICD-10-CM

## 2018-04-18 DIAGNOSIS — I10 Essential (primary) hypertension: Secondary | ICD-10-CM | POA: Diagnosis not present

## 2018-04-18 DIAGNOSIS — M159 Polyosteoarthritis, unspecified: Secondary | ICD-10-CM

## 2018-04-18 DIAGNOSIS — IMO0002 Reserved for concepts with insufficient information to code with codable children: Secondary | ICD-10-CM

## 2018-04-18 DIAGNOSIS — G8929 Other chronic pain: Secondary | ICD-10-CM | POA: Diagnosis not present

## 2018-04-18 DIAGNOSIS — M8949 Other hypertrophic osteoarthropathy, multiple sites: Secondary | ICD-10-CM

## 2018-04-18 LAB — POCT GLYCOSYLATED HEMOGLOBIN (HGB A1C): Hemoglobin A1C: 10.2 % — AB (ref 4.0–5.6)

## 2018-04-18 MED ORDER — SEMAGLUTIDE (1 MG/DOSE) 2 MG/1.5ML ~~LOC~~ SOPN: 1.0000 mg | PEN_INJECTOR | SUBCUTANEOUS | 5 refills | Status: DC

## 2018-04-18 MED ORDER — DICLOFENAC SODIUM 1 % TD GEL: 2.0000 g | Freq: Three times a day (TID) | TRANSDERMAL | 2 refills | Status: AC | PRN

## 2018-04-18 NOTE — Assessment & Plan Note (Signed)
Mildly elevated initial BP, repeat manual check improved. - Home BP readings limited readings  No known complications    Plan:  1. Continue current BP regimen - Losartan 50mg  daily, Amodipine 5mg  daily 2. Encourage improved lifestyle - low sodium diet, improve regular exercise 3. Start monitor BP outside office, bring readings to next visit, if persistently >140/90 or new symptoms notify office sooner 4. Follow-up 3 months

## 2018-04-18 NOTE — Assessment & Plan Note (Signed)
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 Imaging with bone scan 2008 for cancer found reduced cartilage on joints No recent X-rays Improved on Tramadol in past - some nausea Failed multiple NSAIDs  Plan: 1. Continue current Tramadol, not due for refill yet - will review future [facility] contract / UDS in future 2. Start new rx topical Diclofenac gel TID PRN for knee pain  - Discontinue oral NSAIDs - Maximize Tylenol dosing, RICE therapy Follow-up 3 months re-eval consider steroid inj knee/shoulders, future PT, ortho as need

## 2018-04-18 NOTE — Assessment & Plan Note (Signed)
Still uncontrolled DM control A1c from 8.9 up to 10.2, attributed to diet and lifestyle No hypoglycemia Complications - peripheral neuropathy L>R Failed: Victoza (cost), Bydureon (local reaction, knot) - Tresiba (cost), Trulicity (ineffective) Contraindicated SGLT2 (bladder cancer)  Plan:  1. INCREASE dose Ozempic from 0.5mg  weekly to 1mg  weekly injection - new rx sent - Continue Lantus 64u nightly - will need to titrate down pending CBGs in future if controlled on Ozempic - Remain off Humalog - Continue Metformin 1000mg  BID - refilled 2. Encourage improved lifestyle - low carb, low sugar diet, reduce portion size, continue improving regular exercise 3. Check CBG , bring log to next visit for review 4. Continue ARB - consider ASA, Statin in future again or alternative PSK9 inhib - he has declined due to statin intolerance 5. Advised to schedule DM ophtho exam [facility] in [place] to go 2019), send record 6. Follow-up 3 months DM A1c  Again - long discussion about future management of diabetes, I advised him now very limited options for medication changes at this point. Strongly recommend diet lifestyle changes as next option in addition to dose increase ozempic. Next plan would be Endocrinology referral for further management - can do local [facility] or larger center [facility] for possible research and cost reduction of medications

## 2018-04-18 NOTE — Patient Instructions (Addendum)
Thank you for coming to the office today.  For Diabetes  A1c 10.2 - we will increase Ozepmic up from 0.5 up to 1mg  weekly - new pen and new dose, sent to pharmacy  Continue other medicines Lantus and Metformin  Keep track of sugars.  Try to dramatically improve diet, reduced portions and reduce carb and starches  Next options - would include referral to Endocrinology diabetes specialist - either at [facility] locally or possibly [facility] or [facility] to maybe benefit from research study and covered medications.  For pain - we can add a nausea medicine for Tramadol as needed.  Or we can refer you to Orthopedic specialist in future to help further with arthritis and pains. As mentioned typically chronic treatment with Tramadol and Hydrocodone is not recommended for arthritis.  Rx Diclofenac or Voltaren gel topical up to 3 times daily use on knees and ankles/feet, it is anti inflammatory, cannot take on same day as Ibuprofen Advil Aleve Naproxen   Please schedule a Follow-up Appointment to: Return in about 3 months (around 07/19/2018) for DM A1c, Knee Pain Arthritis.  If you have any other questions or concerns, please feel free to call the office or send a message through [place]. You may also schedule an earlier appointment if necessary.  Additionally, you may be receiving a survey about your experience at our office within a few days to 1 week by e-mail or mail. We value your feedback.  [name], DO [place]

## 2018-04-18 NOTE — Progress Notes (Signed)
Subjective:    Patient ID: JOHNHENRY TIPPIN, male    DOB: 1951/08/10, 67 y.o.   MRN: [id number]  BAWI LAKINS is a 67 y.o. male presenting on 04/18/2018 for Hypertension and Diabetes   HPI   CHRONIC DM, Type 2with history of DM Neuropathy - Last visit with me 12/2017, for annual discussed same problem, treated with new rx Ozempic dose titrate 0.25 to 0.5 weekly [place] for switched GLP1 had problem with victoza and bydureon and then not adequate result on trulicity, see prior notes for background information. - Interval update with A1c trend 8.7 to 8.9 now today 10.2 - Today patient reports he largely attributes this to his diet, he states often eats larger portions and wrong foods, not following diet, eats sweets, rarely misses meds but has before. CBGs:does not have cbg results todaybut reports avg CBG still >150 200+, no significant hypoglycemia < 100 Meds:Lantus 64u nightly, Ozepmic 0.5mg  weekly [place], Metformin 1000mg  BID Reports good compliance. Tolerating well w/o side-effects Currently on ARB Lifestyle: - Diet (still poor diet, admits sweets, not limiting carb or portion size) - Exercise (limited regular exercise, attributed to weather and knees and chronic joint pain - Due forDM Eye exam [place] in [place], states he will go in 2019 - has not scheduled yet - Prior history of nerve testing L>R reduced sensation  CHRONIC HTN: Reports not checking BP outside office. Mild elevated in office. Current Meds - Losartan 50mg  daily, Amodipine 5mg  daily   Reports good compliance, took meds today. Tolerating well, w/o complaints.  Chronic Pain / osteoarthritis multiple joints - Chronic history of leg and knee joint pain, works on concrete, used to do better with hydrocodone in past, and since he has been on Tramadol PRN from prior PCP with good results but then seems to have more nausea on this medicine. - Previously had cortisone injection in knees before, limited results -  He has not followed Orthopedics before for knees and other joints - Taking Tramadol occasionally about 1-2 every 1-2 weeks, it makes him feel nauseas. He takes some NSAIDs in past limited result with ibuprofen, naproxen, aleve, possibly meloxicam. He takes Tylenol PRN without good results either. - Prior imaging with bone scan 12/2006 for eval of prostate/bladder cancer, also identified early joint cartilage wear in knees and other joints. No recent X-ray imaging   Depression screen [facility] 2/9 04/18/2018 01/17/2018 10/04/2017  Decreased Interest 2 0 0  Down, Depressed, Hopeless 1 0 0  PHQ - 2 Score 3 0 0  Altered sleeping 2 0 -  Tired, decreased energy 2 0 -  Change in appetite 0 0 -  Feeling bad or failure about yourself  1 0 -  Trouble concentrating 0 0 -  Moving slowly or fidgety/restless 0 0 -  Suicidal thoughts 0 0 -  PHQ-9 Score 8 0 -  Difficult doing work/chores Somewhat difficult Not difficult at all -    Social History   Tobacco Use  . Smoking status: Former Smoker    Years: 30.00    Last attempt to quit: 05/18/2004    Years since quitting: 13.9  . Smokeless tobacco: Never Used  Substance Use Topics  . Alcohol use: No    Alcohol/week: 0.0 oz  . Drug use: No    Review of Systems Per HPI unless specifically indicated above     Objective:    BP 138/68 (BP Location: Left Arm, Cuff Size: Normal)   Pulse 72   Temp 98.7  F (37.1 C) (Oral)   Resp 16   Ht 5\' 10"  (1.778 m)   Wt 239 lb (108.4 kg)   BMI 34.29 kg/m   Wt Readings from Last 3 Encounters:  04/18/18 239 lb (108.4 kg)  03/09/18 240 lb (108.9 kg)  01/17/18 244 lb 12.8 oz (111 kg)    Physical Exam  Constitutional: He is oriented to person, place, and time. He appears well-developed and well-nourished. No distress.  Well-appearing, comfortable, cooperative, obese  HENT:  Head: Normocephalic and atraumatic.  Mouth/Throat: Oropharynx is clear and moist.  Eyes: Conjunctivae are normal. Right eye exhibits no  discharge. Left eye exhibits no discharge.  Neck: Normal range of motion. Neck supple. No thyromegaly present.  Cardiovascular: Normal rate, regular rhythm, normal heart sounds and intact distal pulses.  No murmur heard. Pulmonary/Chest: Effort normal and breath sounds normal. No respiratory distress. He has no wheezes. He has no rales.  Musculoskeletal: Normal range of motion. He exhibits no edema.  Lymphadenopathy:    He has no cervical adenopathy.  Neurological: He is alert and oriented to person, place, and time.  Skin: Skin is warm and dry. No rash noted. He is not diaphoretic. No erythema.  Psychiatric: He has a normal mood and affect. His behavior is normal.  Well groomed, good eye contact, normal speech and thoughts  Nursing note and vitals reviewed.  Results for orders placed or performed in visit on 04/18/18  POCT HgB A1C  Result Value Ref Range   Hemoglobin A1C 10.2 (A) 4.0 - 5.6 %   HbA1c, POC (prediabetic range)  5.7 - 6.4 %   HbA1c, POC (controlled diabetic range)  0.0 - 7.0 %   Recent Labs    10/04/17 0826 01/10/18 0816 04/18/18 0850  HGBA1C 8.7* 8.9* 10.2*        Assessment & Plan:   Problem List Items Addressed This Visit    Essential hypertension    Mildly elevated initial BP, repeat manual check improved. - Home BP readings limited readings  No known complications    Plan:  1. Continue current BP regimen - Losartan 50mg  daily, Amodipine 5mg  daily 2. Encourage improved lifestyle - low sodium diet, improve regular exercise 3. Start monitor BP outside office, bring readings to next visit, if persistently >140/90 or new symptoms notify office sooner 4. Follow-up 3 months      Osteoarthritis of multiple joints    Suspected multiple joint osteoarthritis with some chronic pain, worse with labor and repetitive activity Affecting knees, feet, and other joints. Imaging with bone scan 2008 for cancer found reduced cartilage on joints No recent X-rays Improved  on Tramadol in past - some nausea Failed multiple NSAIDs  Plan: 1. Continue current Tramadol, not due for refill yet - will review future [facility] contract / UDS in future 2. Start new rx topical Diclofenac gel TID PRN for knee pain  - Discontinue oral NSAIDs - Maximize Tylenol dosing, RICE therapy Follow-up 3 months re-eval consider steroid inj knee/shoulders, future PT, ortho as need      Relevant Medications   diclofenac sodium (VOLTAREN) 1 % GEL   Uncontrolled type 2 diabetes mellitus with peripheral neuropathy ([place]) - Primary    Still uncontrolled DM control A1c from 8.9 up to 10.2, attributed to diet and lifestyle No hypoglycemia Complications - peripheral neuropathy L>R Failed: Victoza (cost), Bydureon (local reaction, knot) - Tresiba (cost), Trulicity (ineffective) Contraindicated SGLT2 (bladder cancer)  Plan:  1. INCREASE dose Ozempic from 0.5mg  weekly to 1mg  weekly  injection - new rx sent - Continue Lantus 64u nightly - will need to titrate down pending CBGs in future if controlled on Ozempic - Remain off Humalog - Continue Metformin 1000mg  BID - refilled 2. Encourage improved lifestyle - low carb, low sugar diet, reduce portion size, continue improving regular exercise 3. Check CBG , bring log to next visit for review 4. Continue ARB - consider ASA, Statin in future again or alternative PSK9 inhib - he has declined due to statin intolerance 5. Advised to schedule DM ophtho exam [facility] in [place] to go 2019), send record 6. Follow-up 3 months DM A1c  Again - long discussion about future management of diabetes, I advised him now very limited options for medication changes at this point. Strongly recommend diet lifestyle changes as next option in addition to dose increase ozempic. Next plan would be Endocrinology referral for further management - can do local [facility] or larger center [facility] for possible research and cost reduction of medications      Relevant  Medications   Semaglutide (OZEMPIC) 1 MG/DOSE SOPN   Other Relevant Orders   POCT HgB A1C (Completed)    Other Visit Diagnoses    Bilateral chronic knee pain       Relevant Medications   diclofenac sodium (VOLTAREN) 1 % GEL      Meds ordered this encounter  Medications  . Semaglutide (OZEMPIC) 1 MG/DOSE SOPN    Sig: Inject 1 mg into the skin once a week.    Dispense:  2 pen    Refill:  5    Please dispense appropriate 30 day supply, dose change increased from 0.5 to 1mg  weekly  . diclofenac sodium (VOLTAREN) 1 % GEL    Sig: Apply 2 g topically 3 (three) times daily as needed. For knees    Dispense:  100 g    Refill:  2    Follow up plan: Return in about 3 months (around 07/19/2018) for DM A1c, Knee Pain Arthritis.  [name], [facility] 04/18/2018, 1:19 PM

## 2018-04-26 ENCOUNTER — Telehealth: Payer: Self-pay | Admitting: Family Medicine

## 2018-04-26 DIAGNOSIS — E1165 Type 2 diabetes mellitus with hyperglycemia: Principal | ICD-10-CM

## 2018-04-26 DIAGNOSIS — IMO0002 Reserved for concepts with insufficient information to code with codable children: Secondary | ICD-10-CM

## 2018-04-26 DIAGNOSIS — E1142 Type 2 diabetes mellitus with diabetic polyneuropathy: Secondary | ICD-10-CM

## 2018-04-26 MED ORDER — DULAGLUTIDE 1.5 MG/0.5ML ~~LOC~~ SOAJ: 1.5000 mg | SUBCUTANEOUS | 5 refills | Status: DC

## 2018-04-26 NOTE — Telephone Encounter (Signed)
Called patient and spoke with him, he prefers to switch back, was doing better with Trulicity 1.5 weekly instead of Ozempic, he prefers to switch back also due to cost. Sent new rx back to pharmacy, stop Ozempic start Trulicity again  [name], [place] 04/26/2018, 12:59 PM

## 2018-04-26 NOTE — Telephone Encounter (Signed)
Pt. Called wanted to know if he could go back on trulicity . Pt  Call bac # is [phone]

## 2018-07-23 ENCOUNTER — Ambulatory Visit: Payer: BLUE CROSS/BLUE SHIELD | Admitting: Family Medicine

## 2018-07-25 ENCOUNTER — Ambulatory Visit (INDEPENDENT_AMBULATORY_CARE_PROVIDER_SITE_OTHER): Payer: BLUE CROSS/BLUE SHIELD | Admitting: Family Medicine

## 2018-07-25 ENCOUNTER — Encounter: Payer: Self-pay | Admitting: Family Medicine

## 2018-07-25 VITALS — BP 138/64 | HR 70 | Temp 98.2°F | Resp 16 | Ht 70.0 in | Wt 241.6 lb

## 2018-07-25 DIAGNOSIS — M150 Primary generalized (osteo)arthritis: Secondary | ICD-10-CM

## 2018-07-25 DIAGNOSIS — E1165 Type 2 diabetes mellitus with hyperglycemia: Secondary | ICD-10-CM | POA: Diagnosis not present

## 2018-07-25 DIAGNOSIS — E1142 Type 2 diabetes mellitus with diabetic polyneuropathy: Secondary | ICD-10-CM

## 2018-07-25 DIAGNOSIS — M159 Polyosteoarthritis, unspecified: Secondary | ICD-10-CM

## 2018-07-25 DIAGNOSIS — IMO0002 Reserved for concepts with insufficient information to code with codable children: Secondary | ICD-10-CM

## 2018-07-25 DIAGNOSIS — M8949 Other hypertrophic osteoarthropathy, multiple sites: Secondary | ICD-10-CM

## 2018-07-25 LAB — POCT GLYCOSYLATED HEMOGLOBIN (HGB A1C): HEMOGLOBIN A1C: 8.5 % — AB (ref 4.0–5.6)

## 2018-07-25 MED ORDER — FREESTYLE LIBRE 14 DAY READER DEVI: 1.0000 | 3 refills | Status: DC

## 2018-07-25 MED ORDER — FREESTYLE LIBRE SENSOR SYSTEM MISC: 11 refills | Status: DC

## 2018-07-25 MED ORDER — TRAMADOL HCL 50 MG PO TABS: 50.0000 mg | ORAL_TABLET | Freq: Three times a day (TID) | ORAL | 2 refills | Status: DC | PRN

## 2018-07-25 NOTE — Assessment & Plan Note (Addendum)
Significantly improved DM control now A1c 8.5 from prior 10.2 Improved diet and reduce portions, back on Trulicity GLP1 Still hyperglycemia, but improved No hypoglycemia Complications - peripheral neuropathy L>R Failed: Victoza (cost), Bydureon (local reaction, knot) - Tyler Aas (cost), Ozempic (ineffective), Contraindicated SGLT2 (bladder cancer)  Plan:  1. Continue Trulicity 1.5mg  weekly  injection - cost is not issue for him with this med, he does not need assistance - Continue Lantus 64u nightly - will need to titrate down pending CBGs in future as advised - Continue Metformin 1000mg  BID 2. Encourage improved lifestyle - low carb, low sugar diet, reduce portion size, continue improving regular exercise 3. Check CBG , bring log to next visit for review - Order new [employer] sensor testing system, printed rx he will check cost coverage 4. Continue ARB - consider ASA, Statin in future again or alternative PSK9 inhib - he has declined due to statin intolerance 5. Last updated DM [place] in [place] - request record today, from 03/2018 6. Follow-up 6 months annual and labs A1c

## 2018-07-25 NOTE — Assessment & Plan Note (Signed)
Improved control on Tramadol / Diclofenac topical, limited other options Underlying etiology of chronic pain with OA/DJD multiple joints Affecting knees, feet, and other joints. Imaging with bone scan 2008 for cancer found reduced cartilage on joints No recent X-rays Improved on Tramadol in past - some nausea Failed multiple NSAIDs  Plan: 1. Continue current Tramadol - refilled today 2. Continue beneficial topical Diclofenac gel TID PRN for knee pain  - remain off oral NSAID - Maximize Tylenol dosing, RICE therapy Follow-up future re-eval consider steroid inj knee/shoulders, future PT, ortho as need

## 2018-07-25 NOTE — Patient Instructions (Addendum)
Thank you for coming to the office today.  Continue Trulicity 1.5mg  weekly Continue Lantus - may adjust dose as needed based on readings Continue Metformin  Printed new rx for sensor glucometer - let me know if need to change anything  Refilled Tramadol - sent to pharmacy, use as needed Continue topical diclofenac  Please schedule and return for a NURSE ONLY VISIT for VACCINE - Approximately around October 2019 - Need High Dose Flu Vaccine  DUE for FASTING BLOOD WORK (no food or drink after midnight before the lab appointment, only water or coffee without cream/sugar on the morning of)  SCHEDULE "Lab Only" visit in the morning at the clinic for lab draw in 6 MONTHS   - Make sure Lab Only appointment is at about 1 week before your next appointment, so that results will be available  For Lab Results, once available within 2-3 days of blood draw, you can can log in to MyChart online to view your results and a brief explanation. Also, we can discuss results at next follow-up visit.    Please schedule a Follow-up Appointment to: Return in about 6 months (around 01/25/2019) for Annual Physical.  If you have any other questions or concerns, please feel free to call the office or send a message through [place]. You may also schedule an earlier appointment if necessary.  Additionally, you may be receiving a survey about your experience at our office within a few days to 1 week by e-mail or mail. We value your feedback.  [name], DO [place]

## 2018-07-25 NOTE — Progress Notes (Signed)
Subjective:    Patient ID: Henry Horne, male    DOB: 08-27-1951, 67 y.o.   MRN: [id number]  Henry Horne is a 67 y.o. male presenting on 07/25/2018 for Diabetes   HPI   CHRONIC DM, Type 2with history of DM Neuropathy Last visit 03/2018, decided to switch from Trulicity 1.5 to Ozempic up to 0.5mg , and he had elevated A1c after that trial  And we did not increase up to Ozempic 1mg  he preferred to reduce diet and switch back to Trulicity 1.5 - interval update - overall significantly improved DM control back on Trulicity with better appetite suppression, also - Recent vacation, went to [place] and he had CBG up to 300s, gradually reduced since returned - Today A1c improved CBGs:improved average  Meds:Lantus 12I nightly, Trulicity 1.5mg  weekly , Metformin 1000mg  BID Reports good compliance.Tolerating well w/o side-effects Currently on ARB Lifestyle: - Diet (improving diet, had recent issue with non adherence, but now back on track, reduced portions, with reduced appetite) - Exercise (limited regular exercise, knee chronic joint pain) - Last DM Eye exam [facility] in [place] in 03/2018 - will request record - Prior history of nerve testing L>R reduced sensation Admits some urinary frequency with hyperglycemia, but improved when sugar reduced Admits some numbness tingling feet Denies hypoglycemia, polyuria, visual changes  CHRONIC HTN: Reportsnot checking BP outside office. Current Meds -Losartan 50mg  daily, Amodipine 5mg  daily Reports good compliance, took meds today. Tolerating well, w/o complaints.  Chronic Pain /osteoarthritis multiple joints - Chronic history of leg and knee joint pain, works on concrete, used to do better with hydrocodone in past, and since he has been on Tramadol PRN from prior PCP with good results, improved daily function. Previously he was given trial on Diclofenac topical PRN with good results lasting 2-4 hours only, has taken less  Tramadol - Previously had cortisone injection in knees before, limited results - Taking Tramadol occasionally about 1-2 every 1-2 weeks. Nausea on NSAIDs limited options for him in past, he takes Tylenol PRN without good results either. - Prior imaging with bone scan 12/2006 for eval of prostate/bladder cancer, also identified early joint cartilage wear in knees and other joints. No recent X-ray imaging  Health Maintenance: Due for Flu Shot, will get at [place]  Depression screen [facility] 2/9 07/25/2018 04/18/2018 01/17/2018  Decreased Interest 0 2 0  Down, Depressed, Hopeless 0 1 0  PHQ - 2 Score 0 3 0  Altered sleeping - 2 0  Tired, decreased energy - 2 0  Change in appetite - 0 0  Feeling bad or failure about yourself  - 1 0  Trouble concentrating - 0 0  Moving slowly or fidgety/restless - 0 0  Suicidal thoughts - 0 0  PHQ-9 Score - 8 0  Difficult doing work/chores - Somewhat difficult Not difficult at all    Social History   Tobacco Use  . Smoking status: Former Smoker    Years: 30.00    Last attempt to quit: 05/18/2004    Years since quitting: 14.1  . Smokeless tobacco: Former Network engineer Use Topics  . Alcohol use: No    Alcohol/week: 0.0 standard drinks  . Drug use: No    Review of Systems Per HPI unless specifically indicated above     Objective:    BP 138/64 (BP Location: Left Arm, Cuff Size: Normal)   Pulse 70   Temp 98.2 F (36.8 C) (Oral)   Resp 16   Ht 5'  10" (1.778 m)   Wt 241 lb 9.6 oz (109.6 kg)   BMI 34.67 kg/m   Wt Readings from Last 3 Encounters:  07/25/18 241 lb 9.6 oz (109.6 kg)  04/18/18 239 lb (108.4 kg)  03/09/18 240 lb (108.9 kg)    Physical Exam  Constitutional: He is oriented to person, place, and time. He appears well-developed and well-nourished. No distress.  Well-appearing, comfortable, cooperative, obese  HENT:  Head: Normocephalic and atraumatic.  Mouth/Throat: Oropharynx is clear and moist.  Eyes: Conjunctivae are  normal. Right eye exhibits no discharge. Left eye exhibits no discharge.  Neck: Normal range of motion. Neck supple. No thyromegaly present.  Cardiovascular: Normal rate, regular rhythm, normal heart sounds and intact distal pulses.  No murmur heard. Pulmonary/Chest: Effort normal and breath sounds normal. No respiratory distress. He has no wheezes. He has no rales.  Musculoskeletal: Normal range of motion. He exhibits no edema.  Lymphadenopathy:    He has no cervical adenopathy.  Neurological: He is alert and oriented to person, place, and time.  Skin: Skin is warm and dry. No rash noted. He is not diaphoretic. No erythema.  Psychiatric: He has a normal mood and affect. His behavior is normal.  Well groomed, good eye contact, normal speech and thoughts  Nursing note and vitals reviewed.    Diabetic Foot Exam - Simple   Simple Foot Form Diabetic Foot exam was performed with the following findings:  Yes 07/25/2018  2:09 PM  Visual Inspection See comments:  Yes Sensation Testing See comments:  Yes Pulse Check Posterior Tibialis and Dorsalis pulse intact bilaterally:  Yes Comments Mild reduced monofilament sensation L>R forefoot and great toe. Bilateral callus formation, some early, some advanced, no ulceration. Chronic loss of toenails bilateral.     Results for orders placed or performed in visit on 07/25/18  POCT HgB A1C  Result Value Ref Range   Hemoglobin A1C 8.5 (A) 4.0 - 5.6 %   Recent Labs    01/10/18 0816 04/18/18 0850 07/25/18 1355  HGBA1C 8.9* 10.2* 8.5*      Assessment & Plan:   Problem List Items Addressed This Visit    Osteoarthritis of multiple joints    Improved control on Tramadol / Diclofenac topical, limited other options Underlying etiology of chronic pain with OA/DJD multiple joints Affecting knees, feet, and other joints. Imaging with bone scan 2008 for cancer found reduced cartilage on joints No recent X-rays Improved on Tramadol in past - some  nausea Failed multiple NSAIDs  Plan: 1. Continue current Tramadol - refilled today 2. Continue beneficial topical Diclofenac gel TID PRN for knee pain  - remain off oral NSAID - Maximize Tylenol dosing, RICE therapy Follow-up future re-eval consider steroid inj knee/shoulders, future PT, ortho as need      Relevant Medications   traMADol (ULTRAM) 50 MG tablet   Uncontrolled type 2 diabetes mellitus with peripheral neuropathy ([place]) - Primary    Significantly improved DM control now A1c 8.5 from prior 10.2 Improved diet and reduce portions, back on Trulicity GLP1 Still hyperglycemia, but improved No hypoglycemia Complications - peripheral neuropathy L>R Failed: Victoza (cost), Bydureon (local reaction, knot) - Tyler Aas (cost), Ozempic (ineffective), Contraindicated SGLT2 (bladder cancer)  Plan:  1. Continue Trulicity 1.5mg  weekly [place] injection - cost is not issue for him with this med, he does not need assistance - Continue Lantus 64u nightly - will need to titrate down pending CBGs in future as advised - Continue Metformin 1000mg  BID 2. Encourage improved  lifestyle - low carb, low sugar diet, reduce portion size, continue improving regular exercise 3. Check CBG , bring log to next visit for review - Order new Freestyle Libre sensor testing system, printed rx he will check cost coverage 4. Continue ARB - consider ASA, Statin in future again or alternative PSK9 inhib - he has declined due to statin intolerance 5. Last updated DM [place] in [place] - request record today, from 03/2018 6. Follow-up 6 months annual and labs A1c      Relevant Medications   Continuous Blood Gluc Receiver (FREESTYLE LIBRE 14 DAY READER) DEVI   Continuous Blood Gluc Sensor ([place]) MISC   Other Relevant Orders   POCT HgB A1C (Completed)      Meds ordered this encounter  Medications  . Continuous Blood Gluc Receiver (FREESTYLE LIBRE 14 DAY READER) DEVI    Sig: 1 each by  Does not apply route every 14 (fourteen) days. Use to check blood sugar with Freestyle libre    Dispense:  1 Device    Refill:  3  . Continuous Blood Gluc Sensor ([place]) MISC    Sig: Use to check blood sugar with Freestyle libre device, as needed. Replace every 14 days    Dispense:  2 each    Refill:  11  . traMADol (ULTRAM) 50 MG tablet    Sig: Take 1 tablet (50 mg total) by mouth every 8 (eight) hours as needed.    Dispense:  30 tablet    Refill:  2    Follow up plan: Return in about 6 months (around 01/25/2019) for Annual Physical.  Future labs ordered for 01/18/19  [name], [facility] 07/25/2018, 5:16 PM

## 2018-07-26 ENCOUNTER — Other Ambulatory Visit: Payer: Self-pay | Admitting: Family Medicine

## 2018-07-26 DIAGNOSIS — I10 Essential (primary) hypertension: Secondary | ICD-10-CM

## 2018-07-26 DIAGNOSIS — IMO0002 Reserved for concepts with insufficient information to code with codable children: Secondary | ICD-10-CM

## 2018-07-26 DIAGNOSIS — E1169 Type 2 diabetes mellitus with other specified complication: Secondary | ICD-10-CM

## 2018-07-26 DIAGNOSIS — M150 Primary generalized (osteo)arthritis: Secondary | ICD-10-CM

## 2018-07-26 DIAGNOSIS — C61 Malignant neoplasm of prostate: Secondary | ICD-10-CM

## 2018-07-26 DIAGNOSIS — E1165 Type 2 diabetes mellitus with hyperglycemia: Secondary | ICD-10-CM

## 2018-07-26 DIAGNOSIS — E1142 Type 2 diabetes mellitus with diabetic polyneuropathy: Secondary | ICD-10-CM

## 2018-07-26 DIAGNOSIS — Z0000 Encounter for general adult medical examination without abnormal findings: Secondary | ICD-10-CM

## 2018-07-26 DIAGNOSIS — C671 Malignant neoplasm of dome of bladder: Secondary | ICD-10-CM

## 2018-07-26 DIAGNOSIS — M159 Polyosteoarthritis, unspecified: Secondary | ICD-10-CM

## 2018-07-26 DIAGNOSIS — E785 Hyperlipidemia, unspecified: Secondary | ICD-10-CM

## 2018-07-26 DIAGNOSIS — M8949 Other hypertrophic osteoarthropathy, multiple sites: Secondary | ICD-10-CM

## 2018-08-02 ENCOUNTER — Encounter: Payer: Self-pay | Admitting: Family Medicine

## 2018-08-10 ENCOUNTER — Other Ambulatory Visit: Payer: Self-pay | Admitting: Family Medicine

## 2018-08-10 DIAGNOSIS — E1121 Type 2 diabetes mellitus with diabetic nephropathy: Secondary | ICD-10-CM

## 2018-08-10 DIAGNOSIS — E1165 Type 2 diabetes mellitus with hyperglycemia: Secondary | ICD-10-CM

## 2018-08-10 DIAGNOSIS — Z794 Long term (current) use of insulin: Principal | ICD-10-CM

## 2018-08-10 DIAGNOSIS — IMO0002 Reserved for concepts with insufficient information to code with codable children: Secondary | ICD-10-CM

## 2018-11-15 ENCOUNTER — Other Ambulatory Visit: Payer: Self-pay | Admitting: Family Medicine

## 2018-11-15 DIAGNOSIS — IMO0002 Reserved for concepts with insufficient information to code with codable children: Secondary | ICD-10-CM

## 2018-11-15 DIAGNOSIS — E1165 Type 2 diabetes mellitus with hyperglycemia: Principal | ICD-10-CM

## 2018-11-15 DIAGNOSIS — E1142 Type 2 diabetes mellitus with diabetic polyneuropathy: Secondary | ICD-10-CM

## 2018-12-16 ENCOUNTER — Other Ambulatory Visit: Payer: Self-pay | Admitting: Nurse Practitioner

## 2018-12-16 DIAGNOSIS — E1142 Type 2 diabetes mellitus with diabetic polyneuropathy: Secondary | ICD-10-CM

## 2018-12-16 DIAGNOSIS — IMO0002 Reserved for concepts with insufficient information to code with codable children: Secondary | ICD-10-CM

## 2018-12-16 DIAGNOSIS — E1165 Type 2 diabetes mellitus with hyperglycemia: Principal | ICD-10-CM

## 2019-01-18 ENCOUNTER — Other Ambulatory Visit: Payer: BLUE CROSS/BLUE SHIELD

## 2019-01-18 ENCOUNTER — Other Ambulatory Visit: Payer: Self-pay

## 2019-01-18 ENCOUNTER — Encounter: Payer: Self-pay | Admitting: Family Medicine

## 2019-01-18 ENCOUNTER — Ambulatory Visit (INDEPENDENT_AMBULATORY_CARE_PROVIDER_SITE_OTHER): Payer: BLUE CROSS/BLUE SHIELD | Admitting: Family Medicine

## 2019-01-18 VITALS — BP 155/79 | HR 79 | Temp 98.2°F | Resp 16 | Ht 70.0 in | Wt 244.6 lb

## 2019-01-18 DIAGNOSIS — IMO0002 Reserved for concepts with insufficient information to code with codable children: Secondary | ICD-10-CM

## 2019-01-18 DIAGNOSIS — E1169 Type 2 diabetes mellitus with other specified complication: Secondary | ICD-10-CM

## 2019-01-18 DIAGNOSIS — Z0000 Encounter for general adult medical examination without abnormal findings: Secondary | ICD-10-CM

## 2019-01-18 DIAGNOSIS — C61 Malignant neoplasm of prostate: Secondary | ICD-10-CM

## 2019-01-18 DIAGNOSIS — E785 Hyperlipidemia, unspecified: Secondary | ICD-10-CM

## 2019-01-18 DIAGNOSIS — J019 Acute sinusitis, unspecified: Secondary | ICD-10-CM | POA: Diagnosis not present

## 2019-01-18 DIAGNOSIS — I10 Essential (primary) hypertension: Secondary | ICD-10-CM

## 2019-01-18 DIAGNOSIS — E1165 Type 2 diabetes mellitus with hyperglycemia: Secondary | ICD-10-CM

## 2019-01-18 DIAGNOSIS — E1142 Type 2 diabetes mellitus with diabetic polyneuropathy: Secondary | ICD-10-CM

## 2019-01-18 DIAGNOSIS — C671 Malignant neoplasm of dome of bladder: Secondary | ICD-10-CM

## 2019-01-18 MED ORDER — AMOXICILLIN-POT CLAVULANATE 875-125 MG PO TABS: 1.0000 | ORAL_TABLET | Freq: Two times a day (BID) | ORAL | 0 refills | Status: DC

## 2019-01-18 MED ORDER — IPRATROPIUM BROMIDE 0.06 % NA SOLN: 2.0000 | Freq: Four times a day (QID) | NASAL | 0 refills | Status: DC

## 2019-01-18 NOTE — Progress Notes (Signed)
Subjective:    Patient ID: Henry Horne, male    DOB: January 10, 1951, 68 y.o.   MRN: [id number]  Henry Horne is a 68 y.o. male presenting on 01/18/2019 for Nasal Congestion (cough, sinus, ear pain onset 4 days)  Patient presents for a same day appointment.  HPI   SINUSITIS Reports symptoms started about 4 days ago with sinus congestion, runny nose sinus pain and pressure, some thicker yellow blood tinged nasal drainage. Tried OTC decongestant and cough medicine with some mild improvement. Worse cough in AM clear congestion. - Admits occasional chest congestion - No known sick contact but works in [place] place - Denies any fevers, chills, sweats, body aches, significant cough, dyspnea  Health Maintenance: UTD Flu vaccine, high dose at work [employer] in November 2019  Depression screen [facility] 2/9 07/25/2018 04/18/2018 01/17/2018  Decreased Interest 0 2 0  Down, Depressed, Hopeless 0 1 0  PHQ - 2 Score 0 3 0  Altered sleeping - 2 0  Tired, decreased energy - 2 0  Change in appetite - 0 0  Feeling bad or failure about yourself  - 1 0  Trouble concentrating - 0 0  Moving slowly or fidgety/restless - 0 0  Suicidal thoughts - 0 0  PHQ-9 Score - 8 0  Difficult doing work/chores - Somewhat difficult Not difficult at all    Social History   Tobacco Use  . Smoking status: Former Smoker    Years: 30.00    Last attempt to quit: 05/18/2004    Years since quitting: 14.6  . Smokeless tobacco: Former Network engineer Use Topics  . Alcohol use: No    Alcohol/week: 0.0 standard drinks  . Drug use: No    Review of Systems Per HPI unless specifically indicated above     Objective:    BP (!) 155/79   Pulse 79   Temp 98.2 F (36.8 C) (Oral)   Resp 16   Ht 5\' 10"  (1.778 m)   Wt 244 lb 9.6 oz (110.9 kg)   SpO2 100%   BMI 35.10 kg/m   Wt Readings from Last 3 Encounters:  01/18/19 244 lb 9.6 oz (110.9 kg)  07/25/18 241 lb 9.6 oz (109.6 kg)  04/18/18 239 lb (108.4 kg)    Physical  Exam Vitals signs and nursing note reviewed.  Constitutional:      General: He is not in acute distress.    Appearance: He is well-developed. He is not diaphoretic.     Comments: Well-appearing, comfortable, cooperative  HENT:     Head: Normocephalic and atraumatic.     Comments: Mild maxillary sinuses tender. Nares with turbinate edema and congestion without purulence. Bilateral TMs clear with mild effusion R>L with some fullness without erythema or bulging. Oropharynx post nasal drainage otherwise clear without erythema, exudates, edema or asymmetry. Eyes:     General:        Right eye: No discharge.        Left eye: No discharge.     Conjunctiva/sclera: Conjunctivae normal.  Neck:     Musculoskeletal: Normal range of motion and neck supple.     Thyroid: No thyromegaly.  Cardiovascular:     Rate and Rhythm: Normal rate and regular rhythm.     Heart sounds: Normal heart sounds. No murmur.  Pulmonary:     Effort: Pulmonary effort is normal. No respiratory distress.     Breath sounds: Normal breath sounds. No wheezing or rales.     Comments:  Good air movement. Musculoskeletal: Normal range of motion.  Lymphadenopathy:     Cervical: No cervical adenopathy.  Skin:    General: Skin is warm and dry.     Findings: No erythema or rash.  Neurological:     Mental Status: He is alert and oriented to person, place, and time.  Psychiatric:        Behavior: Behavior normal.     Comments: Well groomed, good eye contact, normal speech and thoughts    Results for orders placed or performed in visit on 08/02/18  HM DIABETES EYE EXAM  Result Value Ref Range   HM Diabetic Eye Exam Retinopathy (A) No Retinopathy      Assessment & Plan:   Problem List Items Addressed This Visit    None    Visit Diagnoses    Acute rhinosinusitis    -  Primary   Relevant Medications   ipratropium (ATROVENT) 0.06 % nasal spray   amoxicillin-clavulanate (AUGMENTIN) 875-125 MG tablet      Consistent with  acute maxillary rhinosinusitis, likely initially viral URI vs allergic rhinitis component with without concern for bacterial infection.  Plan: 1. Reassurance, likely self-limited - no indication for antibiotics at this time - Given weekend, and duration of symptoms if not improved by 48 hours, and worsening sinus pain pressure ear pain and fever can Start Augmentin 875-125mg  PO BID x 7 days 2. Start Atrovent nasal spray decongestant 2 sprays in each nostril up to 4 times daily for 7 days 3. OTC cold cough medicine PRN, prefer mucinex 4. Return criteria reviewed   Meds ordered this encounter  Medications  . ipratropium (ATROVENT) 0.06 % nasal spray    Sig: Place 2 sprays into both nostrils 4 (four) times daily. For up to 5-7 days then stop.    Dispense:  15 mL    Refill:  0  . amoxicillin-clavulanate (AUGMENTIN) 875-125 MG tablet    Sig: Take 1 tablet by mouth 2 (two) times daily.    Dispense:  14 tablet    Refill:  0      Follow up plan: Return in about 1 week (around 01/25/2019), or if symptoms worsen or fail to improve, for sinusitis.   [name], DO [facility] 01/18/2019, 10:40 AM

## 2019-01-18 NOTE — Patient Instructions (Addendum)
Thank you for coming to the office today.  1. It sounds like you have persistent Sinus Congestion or "Rhinosinusitis" - I do not think that this is a Bacterial Sinus Infection. Usually these are caused by Viruses or Allergies, and will run it's course in about 7 to 10 days. - No antibiotics are needed today - If not improved by 48 hours - Start augmentin twice a day for 7 days  Start Atrovent nasal spray decongestant 2 sprays in each nostril up to 4 times daily for 7 days  May try OTC Mucinex DM up to 7-10 days then stop May take Coricidin for high blood pressure - may prefer to avoid sudafed then at this time.  - Recommend to may try using Nasal Saline spray multiple times a day to help flush out congestion and clear sinuses - Improve hydration by drinking plenty of clear fluids (water, gatorade) to reduce secretions and thin congestion - Congestion draining down throat can cause irritation. May try warm herbal tea with honey, cough drops - Can take Tylenol or Ibuprofen as needed for fevers   If you develop persistent fever >101F for at least 3 consecutive days, headaches with sinus pain or pressure or persistent earache, please schedule a follow-up evaluation within next few days to week.   Please schedule a Follow-up Appointment to: Return in about 1 week (around 01/25/2019), or if symptoms worsen or fail to improve, for sinusitis.  If you have any other questions or concerns, please feel free to call the office or send a message through [place]. You may also schedule an earlier appointment if necessary.  Additionally, you may be receiving a survey about your experience at our office within a few days to 1 week by e-mail or mail. We value your feedback.  [name], DO [place]

## 2019-01-19 LAB — LIPID PANEL
CHOL/HDL RATIO: 5.3 (calc) — AB (ref <5.0)
Cholesterol: 171 mg/dL (ref <200)
HDL: 32 mg/dL — AB (ref >=40)
LDL CHOLESTEROL (CALC): 120 mg/dL — AB
Non-HDL Cholesterol (Calc): 139 mg/dL (calc) — ABNORMAL HIGH (ref <130)
Triglycerides: 90 mg/dL (ref <150)

## 2019-01-19 LAB — CBC WITH DIFFERENTIAL/PLATELET
ABSOLUTE MONOCYTES: 838 {cells}/uL (ref 200–950)
BASOS PCT: 0.8 %
Basophils Absolute: 66 cells/uL (ref 0–200)
EOS PCT: 3.3 %
Eosinophils Absolute: 274 cells/uL (ref 15–500)
HEMATOCRIT: 44.7 % (ref 38.5–50.0)
Hemoglobin: 15 g/dL (ref 13.2–17.1)
LYMPHS ABS: 872 {cells}/uL (ref 850–3900)
MCH: 27.5 pg (ref 27.0–33.0)
MCHC: 33.6 g/dL (ref 32.0–36.0)
MCV: 81.9 fL (ref 80.0–100.0)
MPV: 10.5 fL (ref 7.5–12.5)
Monocytes Relative: 10.1 %
NEUTROS PCT: 75.3 %
Neutro Abs: 6250 cells/uL (ref 1500–7800)
PLATELETS: 249 10*3/uL (ref 140–400)
RBC: 5.46 10*6/uL (ref 4.20–5.80)
RDW: 12.4 % (ref 11.0–15.0)
Total Lymphocyte: 10.5 %
WBC: 8.3 10*3/uL (ref 3.8–10.8)

## 2019-01-19 LAB — COMPLETE METABOLIC PANEL WITH GFR
AG RATIO: 1.8 (calc) (ref 1.0–2.5)
ALKALINE PHOSPHATASE (APISO): 71 U/L (ref 35–144)
ALT: 22 U/L (ref 9–46)
AST: 17 U/L (ref 10–35)
Albumin: 4 g/dL (ref 3.6–5.1)
BUN: 12 mg/dL (ref 7–25)
CALCIUM: 9.1 mg/dL (ref 8.6–10.3)
CO2: 28 mmol/L (ref 20–32)
Chloride: 100 mmol/L (ref 98–110)
Creat: 1.03 mg/dL (ref 0.70–1.25)
GFR, EST NON AFRICAN AMERICAN: 74 mL/min/{1.73_m2} (ref >=60)
GFR, Est African American: 86 mL/min/{1.73_m2} (ref >=60)
Globulin: 2.2 g/dL (calc) (ref 1.9–3.7)
Glucose, Bld: 200 mg/dL — ABNORMAL HIGH (ref 65–139)
POTASSIUM: 4.7 mmol/L (ref 3.5–5.3)
SODIUM: 136 mmol/L (ref 135–146)
Total Bilirubin: 0.6 mg/dL (ref 0.2–1.2)
Total Protein: 6.2 g/dL (ref 6.1–8.1)

## 2019-01-19 LAB — HEMOGLOBIN A1C
HEMOGLOBIN A1C: 9.1 %{Hb} — AB (ref <5.7)
Mean Plasma Glucose: 214 (calc)
eAG (mmol/L): 11.9 (calc)

## 2019-01-25 ENCOUNTER — Encounter: Payer: Self-pay | Admitting: Family Medicine

## 2019-01-25 ENCOUNTER — Ambulatory Visit (INDEPENDENT_AMBULATORY_CARE_PROVIDER_SITE_OTHER): Payer: BLUE CROSS/BLUE SHIELD | Admitting: Family Medicine

## 2019-01-25 ENCOUNTER — Other Ambulatory Visit: Payer: Self-pay | Admitting: Family Medicine

## 2019-01-25 ENCOUNTER — Other Ambulatory Visit: Payer: Self-pay

## 2019-01-25 VITALS — BP 138/70 | HR 76 | Temp 98.8°F | Resp 16 | Ht 70.0 in | Wt 241.8 lb

## 2019-01-25 DIAGNOSIS — IMO0002 Reserved for concepts with insufficient information to code with codable children: Secondary | ICD-10-CM

## 2019-01-25 DIAGNOSIS — E66811 Obesity, class 1: Secondary | ICD-10-CM

## 2019-01-25 DIAGNOSIS — I10 Essential (primary) hypertension: Secondary | ICD-10-CM

## 2019-01-25 DIAGNOSIS — T466X5A Adverse effect of antihyperlipidemic and antiarteriosclerotic drugs, initial encounter: Secondary | ICD-10-CM

## 2019-01-25 DIAGNOSIS — C671 Malignant neoplasm of dome of bladder: Secondary | ICD-10-CM

## 2019-01-25 DIAGNOSIS — M791 Myalgia, unspecified site: Secondary | ICD-10-CM

## 2019-01-25 DIAGNOSIS — M159 Polyosteoarthritis, unspecified: Secondary | ICD-10-CM

## 2019-01-25 DIAGNOSIS — M8949 Other hypertrophic osteoarthropathy, multiple sites: Secondary | ICD-10-CM

## 2019-01-25 DIAGNOSIS — M150 Primary generalized (osteo)arthritis: Secondary | ICD-10-CM | POA: Diagnosis not present

## 2019-01-25 DIAGNOSIS — E1165 Type 2 diabetes mellitus with hyperglycemia: Secondary | ICD-10-CM

## 2019-01-25 DIAGNOSIS — G720 Drug-induced myopathy: Secondary | ICD-10-CM | POA: Insufficient documentation

## 2019-01-25 DIAGNOSIS — J432 Centrilobular emphysema: Secondary | ICD-10-CM

## 2019-01-25 DIAGNOSIS — E1142 Type 2 diabetes mellitus with diabetic polyneuropathy: Secondary | ICD-10-CM | POA: Diagnosis not present

## 2019-01-25 DIAGNOSIS — E785 Hyperlipidemia, unspecified: Secondary | ICD-10-CM

## 2019-01-25 DIAGNOSIS — C61 Malignant neoplasm of prostate: Secondary | ICD-10-CM

## 2019-01-25 DIAGNOSIS — Z0000 Encounter for general adult medical examination without abnormal findings: Secondary | ICD-10-CM | POA: Diagnosis not present

## 2019-01-25 DIAGNOSIS — E669 Obesity, unspecified: Secondary | ICD-10-CM

## 2019-01-25 DIAGNOSIS — J4520 Mild intermittent asthma, uncomplicated: Secondary | ICD-10-CM

## 2019-01-25 DIAGNOSIS — E1169 Type 2 diabetes mellitus with other specified complication: Secondary | ICD-10-CM

## 2019-01-25 MED ORDER — TRAMADOL HCL 50 MG PO TABS: 50.0000 mg | ORAL_TABLET | Freq: Three times a day (TID) | ORAL | 2 refills | Status: DC | PRN

## 2019-01-25 MED ORDER — ALBUTEROL SULFATE HFA 108 (90 BASE) MCG/ACT IN AERS: 1.0000 | INHALATION_SPRAY | RESPIRATORY_TRACT | 3 refills | Status: DC | PRN

## 2019-01-25 NOTE — Assessment & Plan Note (Signed)
Mildly elevated initial BP, repeat manual check improved. - Home BP readings limited readings  No known complications     Plan:  1. Continue current BP regimen - Losartan 50mg  daily, Amodipine 5mg  daily 2. Encourage improved lifestyle - low sodium diet, improve regular exercise 3. Again try to start monitor BP outside office, bring readings to next visit, if persistently >140/90 or new symptoms notify office sooner

## 2019-01-25 NOTE — Assessment & Plan Note (Signed)
Stable PSA monitoring Followed by Dr Jacqlyn Larsen [facility]

## 2019-01-25 NOTE — Assessment & Plan Note (Signed)
History of COPD / emphysema - stable without flare up exacerbation No wheezing Former smoker  Off advair old medication Needs refill albuterol PRN

## 2019-01-25 NOTE — Assessment & Plan Note (Signed)
Failed multiple statin due to myalgia 

## 2019-01-25 NOTE — Assessment & Plan Note (Signed)
Mildly elevated LDL, not at goal Last lipid panel 12/2018 Calculated ASCVD 10 yr risk score elevated ASCVD 29% Failed multiple statins  Plan: 1. Declines statin therapy at this time, has failed multiple due to myalgia 2. Encourage improved lifestyle - low carb/cholesterol, reduce portion size, continue improving regular exercise

## 2019-01-25 NOTE — Assessment & Plan Note (Signed)
Stable Followed by Dr Jacqlyn Larsen [facility]

## 2019-01-25 NOTE — Assessment & Plan Note (Signed)
Refill Albuterol Resolving URI now

## 2019-01-25 NOTE — Assessment & Plan Note (Signed)
Weight relatively stable On GLP1 Encourage improve diet lifestyle

## 2019-01-25 NOTE — Patient Instructions (Addendum)
Thank you for coming to the office today.  Recent Labs    04/18/18 0850 07/25/18 1355 01/18/19 1001  HGBA1C 10.2* 8.5* 9.1*   Keep trying to improve diet overall  If ready for 2nd opinion let me know and we can refer you to Endocrine diabetes specialist, may even consider an insulin pump if need   Please schedule a Follow-up Appointment to: Return in about 6 months (around 07/26/2019) for DM A1c, HTN.  If you have any other questions or concerns, please feel free to call the office or send a message through [place]. You may also schedule an earlier appointment if necessary.  Additionally, you may be receiving a survey about your experience at our office within a few days to 1 week by e-mail or mail. We value your feedback.  [name], DO [place]

## 2019-01-25 NOTE — Progress Notes (Signed)
Subjective:    Patient ID: Henry Horne, male    DOB: July 06, 1951, 68 y.o.   MRN: [id number]  Henry Horne is a 68 y.o. male presenting on 01/25/2019 for Annual Exam   HPI  Here for Annual Physical and Lab Review.  CHRONIC DM, Type 2with history of DM Neuropathy Last visit 06/2018, he was continued on same meds, had problem with appetite on Ozempic. Last labs show A1c back up to 9.1, from prior 8.5 CBGs:lowest avg 110-140, and higher week avg 220, rare hypoglycemia < 100 (symptomatic) - He has Freestyle Libre sensor and keeping track of it. Meds:Lantus 40J morning, Trulicity 1.5mg  weekly [place], Metformin 1000mg  BID Reports good compliance.Tolerating well w/o side-effects Currently on ARB Lifestyle: - Diet (Adjusted diet at times, but seems to still be non adherent often with diet) - Exercise (limited regular exercise, knee chronic joint pain) - Last DM Eye exam [facility] in [place] in 03/2018 - Prior history of nerve testing L>R reduced sensation Admits some urinary frequency with hyperglycemia, but improved when sugar reduced Admits some numbness tingling feet Denies hypoglycemia  HYPERLIPIDEMIA / Obesity BMI >34 - Reports no concerns. Last lipid panel 12/2018, mild elevated LDL - Not on statin therapy, failed multiple in past due to statin myalgia intolerance  CHRONIC HTN: Reportsnot checking BP outside office. Mild elevated today. Current Meds -Losartan 50mg  daily, Amodipine 5mg  daily Reports good compliance, took meds today. Tolerating well, w/o complaints.  Chronic Pain /osteoarthritis multiple joints - Chronic history of leg and knee joint pain, works on concrete, used to do better with hydrocodone in past, and since he has been on Tramadol PRN from prior PCP with good results, improved daily function. Previously he was given trial on Diclofenac topical PRN with good results lasting 2-4 hours only, has taken less Tramadol - Previously had cortisone  injection in knees before, limited results -Taking Tramadol occasionally about 1-2 every 1-2 weeks. Nausea on NSAIDs limited options for him in past, he takes Tylenol PRN without good results either. - Prior imaging showed arthritis  Health Maintenance:  Colon CA Screening: Never had colonoscopy he declined previously due to bladder problems. No known family history of colon CA. Due for screening test - he considered cologuard in past but never checked insurance coverage. He would like to check this one again.   Depression screen [facility] 2/9 01/25/2019 07/25/2018 04/18/2018  Decreased Interest 0 0 2  Horne, Depressed, Hopeless 0 0 1  PHQ - 2 Score 0 0 3  Altered sleeping 0 - 2  Tired, decreased energy 0 - 2  Change in appetite 0 - 0  Feeling bad or failure about yourself  0 - 1  Trouble concentrating 0 - 0  Moving slowly or fidgety/restless 0 - 0  Suicidal thoughts 0 - 0  PHQ-9 Score 0 - 8  Difficult doing work/chores Not difficult at all - Somewhat difficult    Past Medical History:  Diagnosis Date  . Anxiety   . Cancer of kidney ([place]) 2010   cystoscopy 2010, 2014, 2015, 2016- MD q 6 months  . Depression   . Emphysema of lung ([place]) 1996  . Hyperlipidemia   . Hypertension   . Joint pain   . Prostate cancer [facility]) 2008   removed with cryotherapy   Past Surgical History:  Procedure Laterality Date  . DIGIT NAIL REMOVAL Left 2016   left and right foot -toe nails   Social History   Socioeconomic History  . Marital  status: Married    Spouse name: Butch Penny  . Number of children: Not on file  . Years of education: Not on file  . Highest education level: Not on file  Occupational History  . Not on file  Social Needs  . Financial resource strain: Not on file  . Food insecurity:    Worry: Not on file    Inability: Not on file  . Transportation needs:    Medical: Not on file    Non-medical: Not on file  Tobacco Use  . Smoking status: Former Smoker    Years: 30.00    Last  attempt to quit: 05/18/2004    Years since quitting: 14.6  . Smokeless tobacco: Former Network engineer and Sexual Activity  . Alcohol use: No    Alcohol/week: 0.0 standard drinks  . Drug use: No  . Sexual activity: Not on file  Lifestyle  . Physical activity:    Days per week: Not on file    Minutes per session: Not on file  . Stress: Not on file  Relationships  . Social connections:    Talks on phone: Not on file    Gets together: Not on file    Attends religious service: Not on file    Active member of club or organization: Not on file    Attends meetings of clubs or organizations: Not on file    Relationship status: Not on file  . Intimate partner violence:    Fear of current or ex partner: Not on file    Emotionally abused: Not on file    Physically abused: Not on file    Forced sexual activity: Not on file  Other Topics Concern  . Not on file  Social History Narrative  . Not on file   Family History  Problem Relation Age of Onset  . Diabetes Mother   . Heart disease Father   . Cancer Father   . Hyperlipidemia Father   . Hypertension Father   . Diabetes Brother   . COPD Brother   . Autoimmune disease Brother   . Heart disease Brother   . Diabetes Brother    Current Outpatient Medications on File Prior to Visit  Medication Sig  . amLODipine (NORVASC) 5 MG tablet Take 1 tablet (5 mg total) by mouth daily.  . Continuous Blood Gluc Receiver (FREESTYLE LIBRE 14 DAY READER) DEVI 1 each by Does not apply route every 14 (fourteen) days. Use to check blood sugar with Freestyle libre  . Continuous Blood Gluc Sensor (Kyle) MISC Use to check blood sugar with Freestyle libre device, as needed. Replace every 14 days  . diclofenac sodium (VOLTAREN) 1 % GEL Apply 2 g topically 3 (three) times daily as needed. For knees  . FLUoxetine (PROZAC) 40 MG capsule Take 1 capsule (40 mg total) by mouth daily.  . Garlic Oil 2 MG CAPS Take 1,000 mg by mouth 2 (two)  times daily. Reported on 11/17/2015  . glucose blood (TRUETEST TEST) test strip 1 each by Other route 2 (two) times daily. Use as instructed  . imipramine (TOFRANIL) 10 MG tablet Take 1 tablet (10 mg total) by mouth at bedtime.  . Insulin Pen Needle (NOVOFINE) 30G X 8 MM MISC Use 1 nweedle to inject Victoza once daily.  . Insulin Pen Needle 31G X 5 MM MISC 1 each by Does not apply route 3 (three) times daily.  . Insulin Pen Needle 31G X 8 MM MISC Use as  directed  . Insulin Syringe-Needle U-100 31G X 5/16" 1 ML MISC 100 each by Does not apply route 2 (two) times daily.  Marland Kitchen LANTUS 100 UNIT/ML injection INJECT 64  UNITS SUBCUTANEOUSLY AT BEDTIME OR MAY SPLIT DOSE FOR 30-32 UNITS TWICE A DAY  . losartan (COZAAR) 50 MG tablet Take 1 tablet (50 mg total) by mouth daily.  . metFORMIN (GLUCOPHAGE) 1000 MG tablet Take 1 tablet (1,000 mg total) by mouth 2 (two) times daily.  . TRUEPLUS LANCETS 30G MISC Use 1 lancet  to check Blood sugar twice daily  . TRULICITY 1.5 XB/2.6OM SOPN INJECT 1.5MG  INTO THE SKIN ONCE A WEEK   No current facility-administered medications on file prior to visit.     Review of Systems  Constitutional: Negative for activity change, appetite change, chills, diaphoresis, fatigue and fever.  HENT: Negative for congestion and hearing loss.   Eyes: Negative for visual disturbance.  Respiratory: Negative for apnea, cough, chest tightness, shortness of breath and wheezing.   Cardiovascular: Negative for chest pain, palpitations and leg swelling.  Gastrointestinal: Negative for abdominal pain, anal bleeding, blood in stool, constipation, diarrhea, nausea and vomiting.  Endocrine: Negative for cold intolerance.  Genitourinary: Negative for dysuria, frequency and hematuria.  Musculoskeletal: Negative for arthralgias, back pain and neck pain.  Skin: Negative for rash.  Allergic/Immunologic: Negative for environmental allergies.  Neurological: Negative for dizziness, weakness,  light-headedness, numbness and headaches.  Hematological: Negative for adenopathy.  Psychiatric/Behavioral: Negative for behavioral problems, dysphoric mood and sleep disturbance. The patient is not nervous/anxious.    Per HPI unless specifically indicated above      Objective:    BP 138/70 (BP Location: Left Arm, Cuff Size: Normal)   Pulse 76   Temp 98.8 F (37.1 C) (Oral)   Resp 16   Ht 5\' 10"  (1.778 m)   Wt 241 lb 12.8 oz (109.7 kg)   BMI 34.69 kg/m   Wt Readings from Last 3 Encounters:  01/25/19 241 lb 12.8 oz (109.7 kg)  01/18/19 244 lb 9.6 oz (110.9 kg)  07/25/18 241 lb 9.6 oz (109.6 kg)    Physical Exam Vitals signs and nursing note reviewed.  Constitutional:      General: He is not in acute distress.    Appearance: He is well-developed. He is not diaphoretic.     Comments: Well-appearing, comfortable, cooperative  HENT:     Head: Normocephalic and atraumatic.     Comments: Frontal / maxillary sinuses non-tender. Nares patent without congestion  Bilateral TMs clear without erythema, effusion or bulging. Oropharynx clear without erythema, exudates, edema or asymmetry. Eyes:     General:        Right eye: No discharge.        Left eye: No discharge.     Conjunctiva/sclera: Conjunctivae normal.     Pupils: Pupils are equal, round, and reactive to light.  Neck:     Musculoskeletal: Normal range of motion and neck supple.     Thyroid: No thyromegaly.  Cardiovascular:     Rate and Rhythm: Normal rate and regular rhythm.     Heart sounds: Normal heart sounds. No murmur.  Pulmonary:     Effort: Pulmonary effort is normal. No respiratory distress.     Breath sounds: Normal breath sounds. No wheezing or rales.     Comments: Good air movement. No crackles or wheezing. Abdominal:     General: Bowel sounds are normal. There is no distension.     Palpations: Abdomen is soft.  There is no mass.     Tenderness: There is no abdominal tenderness.  Musculoskeletal: Normal  range of motion.        General: No tenderness.     Comments: Upper / Lower Extremities: - Normal muscle tone, strength bilateral upper extremities 5/5, lower extremities 5/5  Lymphadenopathy:     Cervical: No cervical adenopathy.  Skin:    General: Skin is warm and dry.     Findings: No erythema or rash.     Comments: Freestyle libre sensor on Left arm.  Neurological:     Mental Status: He is alert and oriented to person, place, and time.     Comments: Distal sensation intact to light touch all extremities  Psychiatric:        Behavior: Behavior normal.     Comments: Well groomed, good eye contact, normal speech and thoughts    Results for orders placed or performed in visit on 01/18/19  Lipid panel  Result Value Ref Range   Cholesterol 171 <200 mg/dL   HDL 32 (L) > OR = 40 mg/dL   Triglycerides 90 <150 mg/dL   LDL Cholesterol (Calc) 120 (H) mg/dL (calc)   Total CHOL/HDL Ratio 5.3 (H) <5.0 (calc)   Non-HDL Cholesterol (Calc) 139 (H) <130 mg/dL (calc)  COMPLETE METABOLIC PANEL WITH GFR  Result Value Ref Range   Glucose, Bld 200 (H) 65 - 139 mg/dL   BUN 12 7 - 25 mg/dL   Creat 1.03 0.70 - 1.25 mg/dL   GFR, Est Non African American 74 > OR = 60 mL/min/1.64m2   GFR, Est African American 86 > OR = 60 mL/min/1.50m2   BUN/Creatinine Ratio NOT APPLICABLE 6 - 22 (calc)   Sodium 136 135 - 146 mmol/L   Potassium 4.7 3.5 - 5.3 mmol/L   Chloride 100 98 - 110 mmol/L   CO2 28 20 - 32 mmol/L   Calcium 9.1 8.6 - 10.3 mg/dL   Total Protein 6.2 6.1 - 8.1 g/dL   Albumin 4.0 3.6 - 5.1 g/dL   Globulin 2.2 1.9 - 3.7 g/dL (calc)   AG Ratio 1.8 1.0 - 2.5 (calc)   Total Bilirubin 0.6 0.2 - 1.2 mg/dL   Alkaline phosphatase (APISO) 71 35 - 144 U/L   AST 17 10 - 35 U/L   ALT 22 9 - 46 U/L  CBC with Differential/Platelet  Result Value Ref Range   WBC 8.3 3.8 - 10.8 Thousand/uL   RBC 5.46 4.20 - 5.80 Million/uL   Hemoglobin 15.0 13.2 - 17.1 g/dL   HCT 44.7 38.5 - 50.0 %   MCV 81.9 80.0 -  100.0 fL   MCH 27.5 27.0 - 33.0 pg   MCHC 33.6 32.0 - 36.0 g/dL   RDW 12.4 11.0 - 15.0 %   Platelets 249 140 - 400 Thousand/uL   MPV 10.5 7.5 - 12.5 fL   Neutro Abs 6,250 1,500 - 7,800 cells/uL   Lymphs Abs 872 850 - 3,900 cells/uL   Absolute Monocytes 838 200 - 950 cells/uL   Eosinophils Absolute 274 15 - 500 cells/uL   Basophils Absolute 66 0 - 200 cells/uL   Neutrophils Relative % 75.3 %   Total Lymphocyte 10.5 %   Monocytes Relative 10.1 %   Eosinophils Relative 3.3 %   Basophils Relative 0.8 %  Hemoglobin A1c  Result Value Ref Range   Hgb A1c MFr Bld 9.1 (H) <5.7 % of total Hgb   Mean Plasma Glucose 214 (calc)   eAG (  mmol/L) 11.9 (calc)      Assessment & Plan:   Problem List Items Addressed This Visit    Asthma    Refill Albuterol Resolving URI now      Relevant Medications   albuterol (PROAIR HFA) 108 (90 Base) MCG/ACT inhaler   Centrilobular emphysema (HCC)    History of COPD / emphysema - stable without flare up exacerbation No wheezing Former smoker  Off advair old medication Needs refill albuterol PRN      Relevant Medications   albuterol (PROAIR HFA) 108 (90 Base) MCG/ACT inhaler   Essential hypertension    Mildly elevated initial BP, repeat manual check improved. - Home BP readings limited readings  No known complications     Plan:  1. Continue current BP regimen - Losartan 50mg  daily, Amodipine 5mg  daily 2. Encourage improved lifestyle - low sodium diet, improve regular exercise 3. Again try to start monitor BP outside office, bring readings to next visit, if persistently >140/90 or new symptoms notify office sooner      Hyperlipidemia associated with type 2 diabetes mellitus ([place])    Mildly elevated LDL, not at goal Last lipid panel 12/2018 Calculated ASCVD 10 yr risk score elevated ASCVD 29% Failed multiple statins  Plan: 1. Declines statin therapy at this time, has failed multiple due to myalgia 2. 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)    Stable Followed by Dr Jacqlyn Larsen [facility]      Malignant neoplasm of prostate [facility])    Stable PSA monitoring Followed by Dr Jacqlyn Larsen [facility]      Myalgia due to statin    Failed multiple statin due to myalgia      Obesity (BMI 30.0-34.9)    Weight relatively stable On GLP1 Encourage improve diet lifestyle      Osteoarthritis of multiple joints    Improved control on Tramadol / Diclofenac topical, limited other options Underlying etiology of chronic pain with OA/DJD multiple joints Affecting knees, feet, and other joints. Imaging with bone scan 2008 for cancer found reduced cartilage on joints No recent X-rays Improved on Tramadol in past - some nausea Failed multiple NSAIDs  Follow-up future re-eval consider steroid inj knee/shoulders, future PT, ortho as need      Uncontrolled type 2 diabetes mellitus with peripheral neuropathy (HCC)    Elevated A1c again hyperglycemia,uncontrolled DM up to 9.1 now, still improved from 10.2 Poor diet by his report Still hyperglycemia No hypoglycemia Complications - peripheral neuropathy L>R Failed: Victoza (cost), Bydureon (local reaction, knot) - Tyler Aas (cost), Ozempic (ineffective), Contraindicated SGLT2 (bladder cancer)  Plan:  1. Continue Trulicity 1.5mg  weekly [place] injection - Continue Lantus 70u daily - Continue Metformin 1000mg  BID 2. Encourage improved lifestyle - low carb, low sugar diet, reduce portion size, continue improving regular exercise 3. Check CBG , bring log to next visit for review - Continue Freestyle Libre 4. Continue ARB - consider ASA, Statin in future again or alternative PSK9 inhib - he has declined due to statin intolerance 5. Last updated DM [place] in [place] 6. Follow-up 6 months A1c       Other Visit Diagnoses    Annual physical  exam    -  Primary      Updated Health Maintenance information Reviewed recent lab results with patient Encouraged improvement to lifestyle with diet and exercise - Goal of weight loss  Due for routine colon cancer screening. Never had colonoscopy (not interested), no family history colon cancer. - Discussion today about recommendations for either Colonoscopy or Cologuard screening, benefits and risks of screening, interested in Cologuard, understands that if positive then recommendation is for diagnostic colonoscopy to follow-up.  - Patient advised to contact insurance first to learn cost, / will notify us when ready for [place] to order Cologuard    Meds ordered this encounter  Medications  . albuterol (PROAIR HFA) 108 (90 Base) MCG/ACT inhaler    Sig: Inhale 1-2 puffs into the lungs every 4 (four) hours as needed for wheezing or shortness of breath.    Dispense:  8.5 g    Refill:  3    Follow up plan: Return in about 6 months (around 07/26/2019) for DM A1c, HTN.  [name], [facility] 01/25/2019, 9:46 AM

## 2019-01-25 NOTE — Assessment & Plan Note (Signed)
>>  ASSESSMENT AND PLAN FOR HISTORY OF BLADDER CANCER WRITTEN ON 01/25/2019 10:29 AM BY Tavarus Poteete J, DO  Stable Followed by Dr Ike [facility]

## 2019-01-25 NOTE — Assessment & Plan Note (Signed)
Elevated A1c again hyperglycemia,uncontrolled DM up to 9.1 now, still improved from 10.2 Poor diet by his report Still hyperglycemia No hypoglycemia Complications - peripheral neuropathy L>R Failed: Victoza (cost), Bydureon (local reaction, knot) - Tyler Aas (cost), Ozempic (ineffective), Contraindicated SGLT2 (bladder cancer)  Plan:  1. Continue Trulicity 1.5mg  weekly [place] injection - Continue Lantus 70u daily - Continue Metformin 1000mg  BID 2. Encourage improved lifestyle - low carb, low sugar diet, reduce portion size, continue improving regular exercise 3. Check CBG , bring log to next visit for review - Continue Freestyle Libre 4. Continue ARB - consider ASA, Statin in future again or alternative PSK9 inhib - he has declined due to statin intolerance 5. Last updated DM [place] in [place] 6. Follow-up 6 months A1c

## 2019-01-25 NOTE — Assessment & Plan Note (Signed)
Improved control on Tramadol / Diclofenac topical, limited other options Underlying etiology of chronic pain with OA/DJD multiple joints Affecting knees, feet, and other joints. Imaging with bone scan 2008 for cancer found reduced cartilage on joints No recent X-rays Improved on Tramadol in past - some nausea Failed multiple NSAIDs  Follow-up future re-eval consider steroid inj knee/shoulders, future PT, ortho as need

## 2019-02-18 ENCOUNTER — Other Ambulatory Visit: Payer: Self-pay | Admitting: Family Medicine

## 2019-02-18 DIAGNOSIS — Z794 Long term (current) use of insulin: Principal | ICD-10-CM

## 2019-02-18 DIAGNOSIS — E1121 Type 2 diabetes mellitus with diabetic nephropathy: Secondary | ICD-10-CM

## 2019-02-18 DIAGNOSIS — E1165 Type 2 diabetes mellitus with hyperglycemia: Secondary | ICD-10-CM

## 2019-02-18 DIAGNOSIS — IMO0002 Reserved for concepts with insufficient information to code with codable children: Secondary | ICD-10-CM

## 2019-02-21 ENCOUNTER — Other Ambulatory Visit: Payer: Self-pay | Admitting: Family Medicine

## 2019-02-21 DIAGNOSIS — I10 Essential (primary) hypertension: Secondary | ICD-10-CM

## 2019-03-17 ENCOUNTER — Other Ambulatory Visit: Payer: Self-pay | Admitting: Family Medicine

## 2019-03-17 DIAGNOSIS — IMO0002 Reserved for concepts with insufficient information to code with codable children: Secondary | ICD-10-CM

## 2019-03-17 DIAGNOSIS — E1165 Type 2 diabetes mellitus with hyperglycemia: Principal | ICD-10-CM

## 2019-03-17 DIAGNOSIS — E1140 Type 2 diabetes mellitus with diabetic neuropathy, unspecified: Secondary | ICD-10-CM

## 2019-04-14 ENCOUNTER — Other Ambulatory Visit: Payer: Self-pay | Admitting: Family Medicine

## 2019-04-14 DIAGNOSIS — R42 Dizziness and giddiness: Secondary | ICD-10-CM

## 2019-04-14 DIAGNOSIS — I10 Essential (primary) hypertension: Secondary | ICD-10-CM

## 2019-04-15 ENCOUNTER — Other Ambulatory Visit: Payer: Self-pay | Admitting: Family Medicine

## 2019-04-15 DIAGNOSIS — R451 Restlessness and agitation: Secondary | ICD-10-CM

## 2019-06-22 ENCOUNTER — Other Ambulatory Visit: Payer: Self-pay | Admitting: Family Medicine

## 2019-06-22 DIAGNOSIS — IMO0002 Reserved for concepts with insufficient information to code with codable children: Secondary | ICD-10-CM

## 2019-06-22 DIAGNOSIS — E1165 Type 2 diabetes mellitus with hyperglycemia: Secondary | ICD-10-CM

## 2019-06-22 DIAGNOSIS — E1121 Type 2 diabetes mellitus with diabetic nephropathy: Secondary | ICD-10-CM

## 2019-07-05 ENCOUNTER — Other Ambulatory Visit: Payer: Self-pay | Admitting: Family Medicine

## 2019-07-05 DIAGNOSIS — M159 Polyosteoarthritis, unspecified: Secondary | ICD-10-CM

## 2019-07-05 DIAGNOSIS — M8949 Other hypertrophic osteoarthropathy, multiple sites: Secondary | ICD-10-CM

## 2019-07-13 ENCOUNTER — Other Ambulatory Visit: Payer: Self-pay | Admitting: Family Medicine

## 2019-07-13 DIAGNOSIS — R451 Restlessness and agitation: Secondary | ICD-10-CM

## 2019-07-26 ENCOUNTER — Other Ambulatory Visit: Payer: Self-pay | Admitting: Family Medicine

## 2019-07-26 ENCOUNTER — Encounter: Payer: Self-pay | Admitting: Family Medicine

## 2019-07-26 ENCOUNTER — Other Ambulatory Visit: Payer: Self-pay

## 2019-07-26 ENCOUNTER — Ambulatory Visit (INDEPENDENT_AMBULATORY_CARE_PROVIDER_SITE_OTHER): Payer: BC Managed Care – PPO | Admitting: Family Medicine

## 2019-07-26 VITALS — BP 160/69 | HR 63 | Temp 98.3°F | Resp 16 | Ht 70.0 in | Wt 239.0 lb

## 2019-07-26 DIAGNOSIS — E1142 Type 2 diabetes mellitus with diabetic polyneuropathy: Secondary | ICD-10-CM

## 2019-07-26 DIAGNOSIS — I10 Essential (primary) hypertension: Secondary | ICD-10-CM

## 2019-07-26 DIAGNOSIS — Z1211 Encounter for screening for malignant neoplasm of colon: Secondary | ICD-10-CM | POA: Diagnosis not present

## 2019-07-26 DIAGNOSIS — IMO0002 Reserved for concepts with insufficient information to code with codable children: Secondary | ICD-10-CM

## 2019-07-26 DIAGNOSIS — E1165 Type 2 diabetes mellitus with hyperglycemia: Secondary | ICD-10-CM

## 2019-07-26 DIAGNOSIS — C61 Malignant neoplasm of prostate: Secondary | ICD-10-CM

## 2019-07-26 DIAGNOSIS — Z0000 Encounter for general adult medical examination without abnormal findings: Secondary | ICD-10-CM

## 2019-07-26 DIAGNOSIS — E1169 Type 2 diabetes mellitus with other specified complication: Secondary | ICD-10-CM

## 2019-07-26 LAB — POCT GLYCOSYLATED HEMOGLOBIN (HGB A1C): Hemoglobin A1C: 10.3 % — AB (ref 4.0–5.6)

## 2019-07-26 NOTE — Assessment & Plan Note (Signed)
Significantly Elevated A1c again hyperglycemia,uncontrolled DM up to 10.3 Poor diet by his report Still hyperglycemia No hypoglycemia Complications - peripheral neuropathy L>R Failed: Victoza (cost), Bydureon (local reaction, knot) - Tyler Aas (cost), Ozempic (ineffective), Contraindicated SGLT2 (bladder cancer)  Plan:  1. REFERRAL to [facility] for further DM management - given poor results so far with our current treatment plans -  Continue Trulicity 1.5mg  weekly [place] injection - Continue Lantus 70u daily - Continue Metformin 1000mg  BID 2. Encourage improved lifestyle - low carb, low sugar diet, reduce portion size, continue improving regular exercise 3. Check CBG , bring log to next visit for review - Continue Freestyle Libre 4. Continue ARB - consider ASA, Statin in future again or alternative PSK9 inhib - he has declined due to statin intolerance 5. Last updated DM [place] in [place] = request update from 06/2019 6. Follow-up 6 months physical

## 2019-07-26 NOTE — Assessment & Plan Note (Signed)
Elevated BP - Home BP readings limited readings  No known complications     Plan:  Start to check BP at home, if >140/90 consistently call within 2 weeks, we can increase Amlodipine from 5 to 10 mg  1. Continue current BP regimen - Losartan 50mg  daily, Amodipine 5mg  daily 2. Encourage improved lifestyle - low sodium diet, improve regular exercise 3. Again try to start monitor BP outside office, bring readings to next visit, if persistently >140/90 or new symptoms notify office sooner

## 2019-07-26 NOTE — Patient Instructions (Addendum)
Thank you for coming to the office today.  Due flu shot this season.  Cologuard ordered.  Referral to Endocrine for diabetes. Stay tuned - call them if not heard back within 2 weeks.  [facility] [address] [place], [place]  [address] Phone: [phone]  -------------------  Check BP at home, if persistently elevated >140/90 over next 2 weeks then can increase Amlodipine from 5 to 10mg  - can call us or send message, otherwise if avg < 140 we can keep current dose.  DUE for FASTING BLOOD WORK (no food or drink after midnight before the lab appointment, only water or coffee without cream/sugar on the morning of)  SCHEDULE "Lab Only" visit in the morning at the clinic for lab draw in 6 MONTHS   - Make sure Lab Only appointment is at about 1 week before your next appointment, so that results will be available  For Lab Results, once available within 2-3 days of blood draw, you can can log in to MyChart online to view your results and a brief explanation. Also, we can discuss results at next follow-up visit.   Please schedule a Follow-up Appointment to: Return in about 6 months (around 01/26/2020) for Annual Physical.  If you have any other questions or concerns, please feel free to call the office or send a message through [place]. You may also schedule an earlier appointment if necessary.  Additionally, you may be receiving a survey about your experience at our office within a few days to 1 week by e-mail or mail. We value your feedback.  [name], DO [place]

## 2019-07-26 NOTE — Progress Notes (Signed)
Subjective:    Patient ID: Henry Horne, male    DOB: 1951/05/27, 68 y.o.   MRN: [id number]  Henry Horne is a 67 y.o. male presenting on 07/26/2019 for Hypertension and Diabetes   HPI  CHRONIC DM, Type 2with history of DM Neuropathy Last visit 12/2018 Lab shows A1c up to 10.3 now from 9s CBGs:lowest avg 150-180, can be higher to 250-400, rare hypoglycemia < 100 (symptomatic) - He has Freestyle [place] sensor and keeping track of it. Meds:Lantus 62E morning,Trulicity 3.6OQ weekly [place], Metformin 1023m BID Reports good compliance.Tolerating well w/o side-effects Currently on ARB Lifestyle: - Diet (Still non adherent often with diet - he admits this is primary problem for him) - Exercise (limited regular exercise,knee chronic joint pain) -LastDM Eye exam [facility] HB[place]approx 1 week ago - will request record - Prior history of nerve testing L>R reduced sensation Admits some urinary frequency with hyperglycemia, but improved when sugar reduced Admits some numbness tingling feet Denies hypoglycemia  CHRONIC HTN: Reports no recent BP numbers at home. He has elevated reading. Current Meds - Losartan 558mdaily, Amlodipine 61m40maily   Reports good compliance, took meds today. Tolerating well, w/o complaints. Denies CP, dyspnea, HA, edema, dizziness / lightheadedness   Health Maintenance: Due Flu vaccine, will get through work.  Due for Cologuard, he needs a new kit.  [facility] 07/26/2019 01/25/2019 07/25/2018  Decreased Interest 0 0 0  Down, Depressed, Hopeless 0 0 0  PHQ - 2 Score 0 0 0  Altered sleeping - 0 -  Tired, decreased energy - 0 -  Change in appetite - 0 -  Feeling bad or failure about yourself  - 0 -  Trouble concentrating - 0 -  Moving slowly or fidgety/restless - 0 -  Suicidal thoughts - 0 -  PHQ-9 Score - 0 -  Difficult doing work/chores - Not difficult at all -    Social History   Tobacco Use  . Smoking status:  Former Smoker    Years: 30.00    Quit date: 05/18/2004    Years since quitting: 15.1  . Smokeless tobacco: Former UseNetwork engineere Topics  . Alcohol use: No    Alcohol/week: 0.0 standard drinks  . Drug use: No    Review of Systems Per HPI unless specifically indicated above     Objective:    BP (!) 160/69   Pulse 63   Temp 98.3 F (36.8 C) (Oral)   Resp 16   Ht 5' 10"  (1.778 m)   Wt 239 lb (108.4 kg)   BMI 34.29 kg/m   Wt Readings from Last 3 Encounters:  07/26/19 239 lb (108.4 kg)  01/25/19 241 lb 12.8 oz (109.7 kg)  01/18/19 244 lb 9.6 oz (110.9 kg)    Physical Exam Vitals signs and nursing note reviewed.  Constitutional:      General: He is not in acute distress.    Appearance: He is well-developed. He is not diaphoretic.     Comments: Well-appearing, comfortable, cooperative, obese  HENT:     Head: Normocephalic and atraumatic.  Eyes:     General:        Right eye: No discharge.        Left eye: No discharge.     Conjunctiva/sclera: Conjunctivae normal.  Neck:     Musculoskeletal: Normal range of motion and neck supple.     Thyroid: No thyromegaly.  Cardiovascular:     Rate and  Rhythm: Normal rate and regular rhythm.     Heart sounds: Normal heart sounds. No murmur.  Pulmonary:     Effort: Pulmonary effort is normal. No respiratory distress.     Breath sounds: Normal breath sounds. No wheezing or rales.  Musculoskeletal: Normal range of motion.  Lymphadenopathy:     Cervical: No cervical adenopathy.  Skin:    General: Skin is warm and dry.     Findings: No erythema or rash.  Neurological:     Mental Status: He is alert and oriented to person, place, and time.  Psychiatric:        Behavior: Behavior normal.     Comments: Well groomed, good eye contact, normal speech and thoughts      Diabetic Foot Exam - Simple   Simple Foot Form Diabetic Foot exam was performed with the following findings: Yes 07/26/2019  8:33 AM  Visual Inspection See  comments: Yes Sensation Testing See comments: Yes Pulse Check Posterior Tibialis and Dorsalis pulse intact bilaterally: Yes Comments Bilateral feet with callus formation heel and great toe forefoot, reduced sensation to monofilament great toes and absent sensation over heels, small closed blister R great toe lateral.     Recent Labs    01/18/19 1001 07/26/19 0826  HGBA1C 9.1* 10.3*     Results for orders placed or performed in visit on 07/26/19  POCT HgB A1C  Result Value Ref Range   Hemoglobin A1C 10.3 (A) 4.0 - 5.6 %      Assessment & Plan:   Problem List Items Addressed This Visit    Essential hypertension    Elevated BP - Home BP readings limited readings  No known complications     Plan:  Start to check BP at home, if >140/90 consistently call within 2 weeks, we can increase Amlodipine from 5 to 10 mg  1. Continue current BP regimen - Losartan 31m daily, Amodipine 571mdaily 2. Encourage improved lifestyle - low sodium diet, improve regular exercise 3. Again try to start monitor BP outside office, bring readings to next visit, if persistently >140/90 or new symptoms notify office sooner      Uncontrolled type 2 diabetes mellitus with peripheral neuropathy ([facility]    Significantly Elevated A1c again hyperglycemia,uncontrolled DM up to 10.3 Poor diet by his report Still hyperglycemia No hypoglycemia Complications - peripheral neuropathy L>R Failed: Victoza (cost), Bydureon (local reaction, knot) - TrTyler Aascost), Ozempic (ineffective), Contraindicated SGLT2 (bladder cancer)  Plan:  1. [facility] - given poor results so far with our current treatment plans -  Continue Trulicity 1.8.7FIeekly Sparkill injection - Continue Lantus 70u daily - Continue Metformin 100075mID 2. Encourage improved lifestyle - low carb, low sugar diet, reduce portion size, continue improving regular exercise 3. Check CBG , bring log to next  visit for review - Continue Freestyle Libre 4. Continue ARB - consider ASA, Statin in future again or alternative PSK9 inhib - he has declined due to statin intolerance 5. Last updated DM [facility] update from 06/2019 6. Follow-up 6 months physical      Relevant Orders   POCT HgB A1C (Completed)   Ambulatory referral to Endocrinology    Other Visit Diagnoses    Screening for colon cancer       Relevant Orders   Cologuard     Due for routine colon cancer screening.  - Discussion today about recommendations for either Colonoscopy or  Cologuard screening, benefits and risks of screening, interested in Cologuard, understands that if positive then recommendation is for diagnostic colonoscopy to follow-up. - Ordered Cologuard today    No orders of the defined types were placed in this encounter.  Orders Placed This Encounter  Procedures  . Cologuard  . Ambulatory referral to Endocrinology    Referral Priority:   Routine    Referral Type:   Consultation    Referral Reason:   Specialty Services Required    Number of Visits Requested:   1  . POCT HgB A1C    Follow up plan: Return in about 6 months (around 01/26/2020) for Annual Physical.  Future orders for 01/20/20  [name], [facility] 07/26/2019, 8:19 AM

## 2019-08-01 ENCOUNTER — Encounter: Payer: Self-pay | Admitting: Family Medicine

## 2019-08-01 LAB — HM DIABETES EYE EXAM

## 2019-08-20 ENCOUNTER — Other Ambulatory Visit: Payer: Self-pay | Admitting: Family Medicine

## 2019-08-20 DIAGNOSIS — I10 Essential (primary) hypertension: Secondary | ICD-10-CM

## 2019-08-20 DIAGNOSIS — R42 Dizziness and giddiness: Secondary | ICD-10-CM

## 2019-08-20 DIAGNOSIS — E1142 Type 2 diabetes mellitus with diabetic polyneuropathy: Secondary | ICD-10-CM

## 2019-08-20 DIAGNOSIS — E1140 Type 2 diabetes mellitus with diabetic neuropathy, unspecified: Secondary | ICD-10-CM

## 2019-08-20 DIAGNOSIS — IMO0002 Reserved for concepts with insufficient information to code with codable children: Secondary | ICD-10-CM

## 2019-08-27 LAB — COLOGUARD: Cologuard: NEGATIVE

## 2019-08-30 ENCOUNTER — Other Ambulatory Visit: Payer: Self-pay | Admitting: Family Medicine

## 2019-08-30 DIAGNOSIS — I10 Essential (primary) hypertension: Secondary | ICD-10-CM

## 2019-09-03 ENCOUNTER — Encounter: Payer: Self-pay | Admitting: Family Medicine

## 2019-10-13 ENCOUNTER — Other Ambulatory Visit: Payer: Self-pay | Admitting: Family Medicine

## 2019-10-13 DIAGNOSIS — E1142 Type 2 diabetes mellitus with diabetic polyneuropathy: Secondary | ICD-10-CM

## 2019-10-13 DIAGNOSIS — IMO0002 Reserved for concepts with insufficient information to code with codable children: Secondary | ICD-10-CM

## 2019-11-09 ENCOUNTER — Other Ambulatory Visit: Payer: Self-pay | Admitting: Family Medicine

## 2019-11-09 DIAGNOSIS — IMO0002 Reserved for concepts with insufficient information to code with codable children: Secondary | ICD-10-CM

## 2019-11-09 DIAGNOSIS — E1165 Type 2 diabetes mellitus with hyperglycemia: Secondary | ICD-10-CM

## 2019-11-09 DIAGNOSIS — E1121 Type 2 diabetes mellitus with diabetic nephropathy: Secondary | ICD-10-CM

## 2019-11-29 ENCOUNTER — Other Ambulatory Visit: Payer: Self-pay | Admitting: Nurse Practitioner

## 2019-11-29 DIAGNOSIS — I10 Essential (primary) hypertension: Secondary | ICD-10-CM

## 2020-01-02 ENCOUNTER — Other Ambulatory Visit: Payer: Self-pay | Admitting: Family Medicine

## 2020-01-02 DIAGNOSIS — R451 Restlessness and agitation: Secondary | ICD-10-CM

## 2020-01-06 ENCOUNTER — Other Ambulatory Visit: Payer: Self-pay

## 2020-01-06 DIAGNOSIS — M159 Polyosteoarthritis, unspecified: Secondary | ICD-10-CM

## 2020-01-06 DIAGNOSIS — M8949 Other hypertrophic osteoarthropathy, multiple sites: Secondary | ICD-10-CM

## 2020-01-06 MED ORDER — TRAMADOL HCL 50 MG PO TABS: 50.0000 mg | ORAL_TABLET | Freq: Three times a day (TID) | ORAL | 2 refills | Status: DC | PRN

## 2020-01-09 ENCOUNTER — Other Ambulatory Visit: Payer: Self-pay | Admitting: Family Medicine

## 2020-01-09 DIAGNOSIS — E1165 Type 2 diabetes mellitus with hyperglycemia: Secondary | ICD-10-CM

## 2020-01-09 DIAGNOSIS — IMO0002 Reserved for concepts with insufficient information to code with codable children: Secondary | ICD-10-CM

## 2020-01-09 DIAGNOSIS — E1121 Type 2 diabetes mellitus with diabetic nephropathy: Secondary | ICD-10-CM

## 2020-01-20 ENCOUNTER — Other Ambulatory Visit: Payer: BC Managed Care – PPO

## 2020-01-21 LAB — HEMOGLOBIN A1C
Hgb A1c MFr Bld: 8.9 % of total Hgb — ABNORMAL HIGH (ref <5.7)
Mean Plasma Glucose: 209 (calc)
eAG (mmol/L): 11.6 (calc)

## 2020-01-21 LAB — CBC WITH DIFFERENTIAL/PLATELET
Absolute Monocytes: 620 cells/uL (ref 200–950)
Basophils Absolute: 73 cells/uL (ref 0–200)
Basophils Relative: 1.1 %
Eosinophils Absolute: 337 cells/uL (ref 15–500)
Eosinophils Relative: 5.1 %
HCT: 46.6 % (ref 38.5–50.0)
Hemoglobin: 15.2 g/dL (ref 13.2–17.1)
Lymphs Abs: 1412 cells/uL (ref 850–3900)
MCH: 26.9 pg — ABNORMAL LOW (ref 27.0–33.0)
MCHC: 32.6 g/dL (ref 32.0–36.0)
MCV: 82.5 fL (ref 80.0–100.0)
MPV: 10.1 fL (ref 7.5–12.5)
Monocytes Relative: 9.4 %
Neutro Abs: 4158 cells/uL (ref 1500–7800)
Neutrophils Relative %: 63 %
Platelets: 247 10*3/uL (ref 140–400)
RBC: 5.65 10*6/uL (ref 4.20–5.80)
RDW: 13 % (ref 11.0–15.0)
Total Lymphocyte: 21.4 %
WBC: 6.6 10*3/uL (ref 3.8–10.8)

## 2020-01-21 LAB — COMPLETE METABOLIC PANEL WITH GFR
AG Ratio: 1.7 (calc) (ref 1.0–2.5)
ALT: 25 U/L (ref 9–46)
AST: 18 U/L (ref 10–35)
Albumin: 4 g/dL (ref 3.6–5.1)
Alkaline phosphatase (APISO): 57 U/L (ref 35–144)
BUN: 17 mg/dL (ref 7–25)
CO2: 31 mmol/L (ref 20–32)
Calcium: 9.2 mg/dL (ref 8.6–10.3)
Chloride: 101 mmol/L (ref 98–110)
Creat: 0.91 mg/dL (ref 0.70–1.25)
GFR, Est African American: 99 mL/min/{1.73_m2} (ref >=60)
GFR, Est Non African American: 86 mL/min/{1.73_m2} (ref >=60)
Globulin: 2.4 g/dL (calc) (ref 1.9–3.7)
Glucose, Bld: 150 mg/dL — ABNORMAL HIGH (ref 65–99)
Potassium: 4.4 mmol/L (ref 3.5–5.3)
Sodium: 138 mmol/L (ref 135–146)
Total Bilirubin: 0.5 mg/dL (ref 0.2–1.2)
Total Protein: 6.4 g/dL (ref 6.1–8.1)

## 2020-01-21 LAB — LIPID PANEL
Cholesterol: 171 mg/dL (ref <200)
HDL: 32 mg/dL — ABNORMAL LOW (ref >=40)
LDL Cholesterol (Calc): 116 mg/dL (calc) — ABNORMAL HIGH
Non-HDL Cholesterol (Calc): 139 mg/dL (calc) — ABNORMAL HIGH (ref <130)
Total CHOL/HDL Ratio: 5.3 (calc) — ABNORMAL HIGH (ref <5.0)
Triglycerides: 123 mg/dL (ref <150)

## 2020-01-21 LAB — TSH: TSH: 2.36 mIU/L (ref 0.40–4.50)

## 2020-01-21 LAB — PSA: PSA: <0.1 ng/mL (ref <=4.0)

## 2020-01-27 ENCOUNTER — Ambulatory Visit: Payer: BC Managed Care – PPO | Admitting: Family Medicine

## 2020-01-29 ENCOUNTER — Ambulatory Visit (INDEPENDENT_AMBULATORY_CARE_PROVIDER_SITE_OTHER): Payer: BC Managed Care – PPO | Admitting: Family Medicine

## 2020-01-29 ENCOUNTER — Encounter: Payer: Self-pay | Admitting: Family Medicine

## 2020-01-29 ENCOUNTER — Other Ambulatory Visit: Payer: Self-pay

## 2020-01-29 VITALS — BP 140/60 | HR 74 | Temp 97.6°F | Resp 16 | Ht 70.0 in | Wt 244.0 lb

## 2020-01-29 DIAGNOSIS — IMO0002 Reserved for concepts with insufficient information to code with codable children: Secondary | ICD-10-CM

## 2020-01-29 DIAGNOSIS — E1142 Type 2 diabetes mellitus with diabetic polyneuropathy: Secondary | ICD-10-CM

## 2020-01-29 DIAGNOSIS — J432 Centrilobular emphysema: Secondary | ICD-10-CM | POA: Diagnosis not present

## 2020-01-29 DIAGNOSIS — E1165 Type 2 diabetes mellitus with hyperglycemia: Secondary | ICD-10-CM

## 2020-01-29 DIAGNOSIS — C671 Malignant neoplasm of dome of bladder: Secondary | ICD-10-CM

## 2020-01-29 DIAGNOSIS — I10 Essential (primary) hypertension: Secondary | ICD-10-CM

## 2020-01-29 DIAGNOSIS — Z0000 Encounter for general adult medical examination without abnormal findings: Secondary | ICD-10-CM | POA: Diagnosis not present

## 2020-01-29 MED ORDER — FLUTICASONE-SALMETEROL 250-50 MCG/DOSE IN AEPB: 1.0000 | INHALATION_SPRAY | Freq: Two times a day (BID) | RESPIRATORY_TRACT | 5 refills | Status: DC

## 2020-01-29 NOTE — Patient Instructions (Addendum)
Thank you for coming to the office today.  Recent Labs    07/26/19 0826 01/20/20 0753  HGBA1C 10.3* 8.9*    Keep up good work on sugar control  Keep track of BP  Good news with Dr Jacqlyn Larsen, we will scan record.  Try sample inhaler and also Advair, pick which works best, and Psychologist, counselling on cost if needed.   COVID-19 vaccination appointments can be made through the Call Center by calling [phone] or by going to [email or web address]  ------------------------------------  [place] COVID19 Vaccine Information  LOCATION:  [facility]) [place] [address]  Hours: Monday - Sunday 8:00am to 12:00pm  COVID-19 Vaccines By Appointment Only  Sign up for [place] List  [email or web address] or text "VACCINE" to [phone] or call [phone]    Please schedule a Follow-up Appointment to: Return in about 6 months (around 07/31/2020) for 6 month DM A1c HTN COPD.  If you have any other questions or concerns, please feel free to call the office or send a message through [place]. You may also schedule an earlier appointment if necessary.  Additionally, you may be receiving a survey about your experience at our office within a few days to 1 week by e-mail or mail. We value your feedback.  [name], DO [place]

## 2020-01-29 NOTE — Progress Notes (Signed)
Subjective:    Patient ID: Henry Horne, male    DOB: [birth year], 69 y.o.   MRN: [id number]  Henry Horne is a 69 y.o. male presenting on 01/29/2020 for Annual Exam   HPI   Here for Annual Physical and Lab Review.  Recent Labs    07/26/19 0826 01/20/20 0753  HGBA1C 10.3* 8.9*     CHRONIC DM, Type 2with history of DM Neuropathy Last visit8/2020, previous referral to Endocrinology [place] unable to schedule at that time, did not reschedule. He has improved lifestyle and changing insulin injection locations. - Lab results A1c previously 10.3, now down to 8.9 CBGs:lowest 80s, average 100-130, high reading 185  - He has [employer] sensor and keeping track of it. Meds:Lantus70umorning,Trulicity 1.5mg  weekly [place], Metformin 1000mg  BID Reports good compliance.Tolerating well w/o side-effects Currently on ARB Lifestyle: - Diet (Improving diet overall, limiting sugars/carbs) - Exercise (limited regular exercise,knee chronic joint pain) -LastDM Eye exam [place] in [place], 07/2019 - Prior history of nerve testing L>R reduced sensation Admits some urinary frequency with hyperglycemia, but improved when sugar reduced Admits some numbness tingling feet Denies hypoglycemia  HYPERLIPIDEMIA / Obesity BMI >35 - Reports no concerns. Last lipid panel 12/2019, mild elevated LDL but stable to improved - Not on statin therapy, failed multiple in past due to statin myalgia intolerance  CHRONIC HTN: Reports no recent BP numbers at home. He has elevated reading. Current Meds - Losartan 50mg  daily, Amlodipine 5mg  daily   Reports good compliance, took meds today. Tolerating well, w/o complaints. 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 No recurrence. Followed by Dr Jacqlyn Larsen, last seen 12/19/19 now  in [place], has traveled to see same provider. He will do yearly cystoscopy.  Additional complaint  Centrilobular Emphysema / Coughing / Night-time triggered Reports when lay down at night, has a clear phlegm cough Used to take Advair, no longer on. Has albuterol PRN uses occasionally it does help, but not always using. Denies significant dyspnea during day  Health Maintenance: UTD Flu vaccine  Due for COVID19 vaccine at age 24+, will sign up for waitlist  Colon CA Screening: Completed Cologuard on 08/27/19, negative, good for 3 years.   Depression screen [facility] 2/9 01/29/2020 07/26/2019 01/25/2019  Decreased Interest 1 0 0  Down, Depressed, Hopeless 1 0 0  PHQ - 2 Score 2 0 0  Altered sleeping 2 - 0  Tired, decreased energy 2 - 0  Change in appetite 1 - 0  Feeling bad or failure about yourself  1 - 0  Trouble concentrating 1 - 0  Moving slowly or fidgety/restless 1 - 0  Suicidal thoughts 1 - 0  PHQ-9 Score 11 - 0  Difficult doing work/chores Somewhat difficult - Not difficult at all  Some recent data might be hidden    Past Medical History:  Diagnosis Date  . Anxiety   . Cancer of kidney ([place]) 2010   cystoscopy 2010, 2014, 2015, 2016- MD q 6 months  . Depression   . Emphysema of lung ([place]) 1996  . Hyperlipidemia   . Hypertension   . Joint pain   . Prostate cancer [facility]) 2008   removed with cryotherapy   Past Surgical History:  Procedure Laterality Date  . DIGIT NAIL REMOVAL Left 2016   left and right foot -toe nails   Social History  Socioeconomic History  . Marital status: Married    Spouse name: Butch Penny  . Number of children: Not on file  . Years of education: Not on file  . Highest education level: Not on file  Occupational History  . Not on file  Tobacco Use  . Smoking status: Former Smoker    Years: 30.00    Quit date: 05/18/2004    Years since quitting: 15.7  . Smokeless tobacco: Former Network engineer and Sexual Activity  . Alcohol use: No     Alcohol/week: 0.0 standard drinks  . Drug use: No  . Sexual activity: Not on file  Other Topics Concern  . Not on file  Social History Narrative  . Not on file   Social Determinants of Health   Financial Resource Strain:   . Difficulty of Paying Living Expenses: Not on file  Food Insecurity:   . Worried About Charity fundraiser in the Last Year: Not on file  . Ran Out of Food in the Last Year: Not on file  Transportation Needs:   . Lack of Transportation (Medical): Not on file  . Lack of Transportation (Non-Medical): Not on file  Physical Activity:   . Days of Exercise per Week: Not on file  . Minutes of Exercise per Session: Not on file  Stress:   . Feeling of Stress : Not on file  Social Connections:   . Frequency of Communication with Friends and Family: Not on file  . Frequency of Social Gatherings with Friends and Family: Not on file  . Attends Religious Services: Not on file  . Active Member of Clubs or Organizations: Not on file  . Attends Archivist Meetings: Not on file  . Marital Status: Not on file  Intimate Partner Violence:   . Fear of Current or Ex-Partner: Not on file  . Emotionally Abused: Not on file  . Physically Abused: Not on file  . Sexually Abused: Not on file   Family History  Problem Relation Age of Onset  . Diabetes Mother   . Heart disease Father   . Cancer Father   . Hyperlipidemia Father   . Hypertension Father   . Diabetes Brother   . COPD Brother   . Autoimmune disease Brother   . Heart disease Brother   . Diabetes Brother    Current Outpatient Medications on File Prior to Visit  Medication Sig  . albuterol (PROAIR HFA) 108 (90 Base) MCG/ACT inhaler Inhale 1-2 puffs into the lungs every 4 (four) hours as needed for wheezing or shortness of breath.  Marland Kitchen amLODipine (NORVASC) 5 MG tablet Take 1 tablet by mouth once daily  . Continuous Blood Gluc Receiver (FREESTYLE LIBRE 14 DAY READER) DEVI 1 each by Does not apply route every  14 (fourteen) days. Use to check blood sugar with Freestyle libre  . Continuous Blood Gluc Sensor (FREESTYLE LIBRE 14 DAY SENSOR) MISC USE TO CHECK BLOOD SUGAR AS NEEDED EVERY 14 DAYS.  Marland Kitchen diclofenac sodium (VOLTAREN) 1 % GEL Apply 2 g topically 3 (three) times daily as needed. For knees  . FLUoxetine (PROZAC) 40 MG capsule Take 1 capsule by mouth once daily  . Garlic Oil 2 MG CAPS Take 1,000 mg by mouth 2 (two) times daily. Reported on 11/17/2015  . glucose blood (TRUETEST TEST) test strip 1 each by Other route 2 (two) times daily. Use as instructed  . imipramine (TOFRANIL) 10 MG tablet TAKE 1 TABLET BY MOUTH AT BEDTIME  .  Insulin Pen Needle (NOVOFINE) 30G X 8 MM MISC Use 1 nweedle to inject Victoza once daily.  . Insulin Pen Needle 31G X 5 MM MISC 1 each by Does not apply route 3 (three) times daily.  . Insulin Pen Needle 31G X 8 MM MISC Use as directed  . Insulin Syringe-Needle U-100 31G X 5/16" 1 ML MISC 100 each by Does not apply route 2 (two) times daily.  Marland Kitchen LANTUS 100 UNIT/ML injection INJECT 70 UNITS SUBCUTANEOUSLY ONCE DAILY  . losartan (COZAAR) 50 MG tablet Take 1 tablet by mouth once daily  . metFORMIN (GLUCOPHAGE) 1000 MG tablet Take 1 tablet by mouth twice daily  . omeprazole (PRILOSEC) 20 MG capsule Take 20 mg by mouth daily.  . traMADol (ULTRAM) 50 MG tablet Take 1 tablet (50 mg total) by mouth every 8 (eight) hours as needed.  . TRUEPLUS LANCETS 30G MISC Use 1 lancet  to check Blood sugar twice daily  . TRULICITY 1.5 0000000 SOPN INJECT 1.5 MG SUBCUTANEOUSLY ONCE A WEEK   No current facility-administered medications on file prior to visit.    Review of Systems  Constitutional: Negative for activity change, appetite change, chills, diaphoresis, fatigue and fever.  HENT: Negative for congestion and hearing loss.   Eyes: Negative for visual disturbance.  Respiratory: Positive for cough. Negative for apnea, choking, chest tightness, shortness of breath and wheezing.     Cardiovascular: Negative for chest pain, palpitations and leg swelling.  Gastrointestinal: Negative for abdominal pain, anal bleeding, blood in stool, constipation, diarrhea, nausea and vomiting.  Endocrine: Negative for cold intolerance.  Genitourinary: Negative for difficulty urinating, dysuria, frequency and hematuria.  Musculoskeletal: Negative for arthralgias and neck pain.  Skin: Negative for rash.  Allergic/Immunologic: Negative for environmental allergies.  Neurological: Negative for dizziness, weakness, light-headedness, numbness and headaches.  Hematological: Negative for adenopathy.  Psychiatric/Behavioral: Negative for behavioral problems, dysphoric mood and sleep disturbance. The patient is not nervous/anxious.    Per HPI unless specifically indicated above      Objective:    BP 140/60 (BP Location: Left Arm, Cuff Size: Normal)   Pulse 74   Temp 97.6 F (36.4 C) (Temporal)   Resp 16   Ht 5\' 10"  (1.778 m)   Wt 244 lb (110.7 kg)   BMI 35.01 kg/m   Wt Readings from Last 3 Encounters:  01/29/20 244 lb (110.7 kg)  07/26/19 239 lb (108.4 kg)  01/25/19 241 lb 12.8 oz (109.7 kg)    Physical Exam Vitals and nursing note reviewed.  Constitutional:      General: He is not in acute distress.    Appearance: He is well-developed. He is obese. He is not diaphoretic.     Comments: Well-appearing, comfortable, cooperative  HENT:     Head: Normocephalic and atraumatic.  Eyes:     General:        Right eye: No discharge.        Left eye: No discharge.     Conjunctiva/sclera: Conjunctivae normal.     Pupils: Pupils are equal, round, and reactive to light.  Neck:     Thyroid: No thyromegaly.  Cardiovascular:     Rate and Rhythm: Normal rate and regular rhythm.     Heart sounds: Normal heart sounds. No murmur.  Pulmonary:     Effort: Pulmonary effort is normal. No respiratory distress.     Breath sounds: No wheezing or rales.     Comments: Reduced lung sounds bilateral  bases L>R Abdominal:  General: Bowel sounds are normal. There is no distension.     Palpations: Abdomen is soft. There is no mass.     Tenderness: There is no abdominal tenderness.  Musculoskeletal:        General: No tenderness. Normal range of motion.     Cervical back: Normal range of motion and neck supple.     Comments: Upper / Lower Extremities: - Normal muscle tone, strength bilateral upper extremities 5/5, lower extremities 5/5  Lymphadenopathy:     Cervical: No cervical adenopathy.  Skin:    General: Skin is warm and dry.     Findings: No erythema or rash.  Neurological:     Mental Status: He is alert and oriented to person, place, and time.     Comments: Distal sensation intact to light touch all extremities  Psychiatric:        Behavior: Behavior normal.     Comments: Well groomed, good eye contact, normal speech and thoughts       Results for orders placed or performed in visit on 01/20/20  TSH  Result Value Ref Range   TSH 2.36 0.40 - 4.50 mIU/L  PSA  Result Value Ref Range   PSA <0.1 < OR = 4.0 ng/mL  Lipid panel  Result Value Ref Range   Cholesterol 171 <200 mg/dL   HDL 32 (L) > OR = 40 mg/dL   Triglycerides 123 <150 mg/dL   LDL Cholesterol (Calc) 116 (H) mg/dL (calc)   Total CHOL/HDL Ratio 5.3 (H) <5.0 (calc)   Non-HDL Cholesterol (Calc) 139 (H) <130 mg/dL (calc)  COMPLETE METABOLIC PANEL WITH GFR  Result Value Ref Range   Glucose, Bld 150 (H) 65 - 99 mg/dL   BUN 17 7 - 25 mg/dL   Creat 0.91 0.70 - 1.25 mg/dL   GFR, Est Non African American 86 > OR = 60 mL/min/1.25m2   GFR, Est African American 99 > OR = 60 mL/min/1.69m2   BUN/Creatinine Ratio NOT APPLICABLE 6 - 22 (calc)   Sodium 138 135 - 146 mmol/L   Potassium 4.4 3.5 - 5.3 mmol/L   Chloride 101 98 - 110 mmol/L   CO2 31 20 - 32 mmol/L   Calcium 9.2 8.6 - 10.3 mg/dL   Total Protein 6.4 6.1 - 8.1 g/dL   Albumin 4.0 3.6 - 5.1 g/dL   Globulin 2.4 1.9 - 3.7 g/dL (calc)   AG Ratio 1.7 1.0 - 2.5  (calc)   Total Bilirubin 0.5 0.2 - 1.2 mg/dL   Alkaline phosphatase (APISO) 57 35 - 144 U/L   AST 18 10 - 35 U/L   ALT 25 9 - 46 U/L  CBC with Differential/Platelet  Result Value Ref Range   WBC 6.6 3.8 - 10.8 Thousand/uL   RBC 5.65 4.20 - 5.80 Million/uL   Hemoglobin 15.2 13.2 - 17.1 g/dL   HCT 46.6 38.5 - 50.0 %   MCV 82.5 80.0 - 100.0 fL   MCH 26.9 (L) 27.0 - 33.0 pg   MCHC 32.6 32.0 - 36.0 g/dL   RDW 13.0 11.0 - 15.0 %   Platelets 247 140 - 400 Thousand/uL   MPV 10.1 7.5 - 12.5 fL   Neutro Abs 4,158 1,500 - 7,800 cells/uL   Lymphs Abs 1,412 850 - 3,900 cells/uL   Absolute Monocytes 620 200 - 950 cells/uL   Eosinophils Absolute 337 15 - 500 cells/uL   Basophils Absolute 73 0 - 200 cells/uL   Neutrophils Relative % 63 %  Total Lymphocyte 21.4 %   Monocytes Relative 9.4 %   Eosinophils Relative 5.1 %   Basophils Relative 1.1 %  Hemoglobin A1c  Result Value Ref Range   Hgb A1c MFr Bld 8.9 (H) <5.7 % of total Hgb   Mean Plasma Glucose 209 (calc)   eAG (mmol/L) 11.6 (calc)      Assessment & Plan:   Problem List Items Addressed This Visit    Uncontrolled type 2 diabetes mellitus with peripheral neuropathy (HCC)    Significantly improved DM control, now A1c down to 8.9 from 10.3 Improved diet, very rare hyperglycemia, now past 1 month well controlled No hypoglycemia Complications - peripheral neuropathy L>R Failed: Victoza (cost), Bydureon (local reaction, knot) - Tyler Aas (cost), Ozempic (ineffective), Contraindicated SGLT2 (bladder cancer)  Plan:  - Note he did not see [facility] Endocrinology when referred previously, did not schedule apt -  Continue Trulicity 1.5mg  weekly [place] injection - Continue Lantus 70u daily - Continue Metformin 1000mg  BID 2. Encourage improved lifestyle - low carb, low sugar diet, reduce portion size, continue improving regular exercise 3. Check CBG , bring log to next visit for review - Continue Freestyle Libre 4. Continue ARB - consider ASA, Statin  in future again or alternative PSK9 inhib - he has declined due to statin intolerance 5. 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 Continue current BP regimen - Losartan 50mg  daily, Amodipine 5mg  daily 2. Encourage improved lifestyle - low sodium diet, improve regular exercise 3.  monitor BP outside office, bring readings to next visit, if persistently >140/90 or new symptoms notify office sooner      Centrilobular emphysema ([place])    Chronic COPD Emphysema Recent worsening night-time symptoms phlegm and cough Off maintenance therapy advair  Will re order Advair 1 puff BID Can try in meantime a sample of Breztri from office, to see if beneficial instead, can order whichever patient prefers if covered. Continue albuterol PRN      Relevant Medications   Fluticasone-Salmeterol (ADVAIR DISKUS) 250-50 MCG/DOSE AEPB    Other Visit Diagnoses    Annual physical exam    -  Primary      Updated Health Maintenance information - Info on COVID19 vaccine given Reviewed recent lab results with patient Encouraged improvement to lifestyle with diet and exercise - Goal of weight loss   Meds ordered this encounter  Medications  . Fluticasone-Salmeterol (ADVAIR DISKUS) 250-50 MCG/DOSE AEPB    Sig: Inhale 1 puff into the lungs 2 (two) times daily.    Dispense:  60 each    Refill:  5    Follow up plan: Return in about 6 months (around 07/31/2020) for 6 month DM A1c HTN COPD.  [name], [facility] 01/29/2020, 10:04 AM

## 2020-01-29 NOTE — Assessment & Plan Note (Addendum)
Elevated BP, improve on repeat - Home BP readings limited readings  No known complications    Plan:  1. Continue current BP regimen - Losartan 50mg  daily, Amodipine 5mg  daily 2. Encourage improved lifestyle - low sodium diet, improve regular exercise 3.  monitor BP outside office, bring readings to next visit, if persistently >140/90 or new symptoms notify office sooner

## 2020-01-29 NOTE — Assessment & Plan Note (Signed)
Chronic COPD Emphysema Recent worsening night-time symptoms phlegm and cough Off maintenance therapy advair  Will re order Advair 1 puff BID Can try in meantime a sample of Breztri from office, to see if beneficial instead, can order whichever patient prefers if covered. Continue albuterol PRN

## 2020-01-29 NOTE — Assessment & Plan Note (Signed)
Significantly improved DM control, now A1c down to 8.9 from 10.3 Improved diet, very rare hyperglycemia, now past 1 month well controlled No hypoglycemia Complications - peripheral neuropathy L>R Failed: Victoza (cost), Bydureon (local reaction, knot) - Tyler Aas (cost), Ozempic (ineffective), Contraindicated SGLT2 (bladder cancer)  Plan:  - Note he did not see [facility] Endocrinology when referred previously, did not schedule apt -  Continue Trulicity 1.5mg  weekly [place] injection - Continue Lantus 70u daily - Continue Metformin 1000mg  BID 2. Encourage improved lifestyle - low carb, low sugar diet, reduce portion size, continue improving regular exercise 3. Check CBG , bring log to next visit for review - Continue Freestyle Libre 4. Continue ARB - consider ASA, Statin in future again or alternative PSK9 inhib - he has declined due to statin intolerance 5. Last updated DM [place] in [place]

## 2020-01-29 NOTE — Assessment & Plan Note (Signed)
>>  ASSESSMENT AND PLAN FOR HISTORY OF BLADDER CANCER WRITTEN ON 01/29/2020  1:18 PM BY Jodie Leiner J, DO  Stable Followed by Dr Ike Urology now moved to practice in [place]  On yearly surveillance cystoscopy, last was negative 11/2019

## 2020-01-29 NOTE — Assessment & Plan Note (Signed)
Stable Followed by Dr Jacqlyn Larsen Urology now moved to practice in [place] On yearly surveillance cystoscopy, last was negative 11/2019

## 2020-01-30 ENCOUNTER — Other Ambulatory Visit: Payer: Self-pay | Admitting: Family Medicine

## 2020-01-30 DIAGNOSIS — E1142 Type 2 diabetes mellitus with diabetic polyneuropathy: Secondary | ICD-10-CM

## 2020-01-30 DIAGNOSIS — IMO0002 Reserved for concepts with insufficient information to code with codable children: Secondary | ICD-10-CM

## 2020-02-06 ENCOUNTER — Other Ambulatory Visit: Payer: Self-pay | Admitting: Family Medicine

## 2020-02-06 DIAGNOSIS — J4520 Mild intermittent asthma, uncomplicated: Secondary | ICD-10-CM

## 2020-02-06 MED ORDER — ALBUTEROL SULFATE HFA 108 (90 BASE) MCG/ACT IN AERS: 1.0000 | INHALATION_SPRAY | RESPIRATORY_TRACT | 3 refills | Status: DC | PRN

## 2020-02-06 NOTE — Telephone Encounter (Signed)
Pt called requesting refill on his  inhaler

## 2020-03-28 ENCOUNTER — Other Ambulatory Visit: Payer: Self-pay | Admitting: Family Medicine

## 2020-03-28 DIAGNOSIS — E1140 Type 2 diabetes mellitus with diabetic neuropathy, unspecified: Secondary | ICD-10-CM

## 2020-03-28 DIAGNOSIS — IMO0002 Reserved for concepts with insufficient information to code with codable children: Secondary | ICD-10-CM

## 2020-03-28 NOTE — Telephone Encounter (Signed)
Requested Prescriptions  Pending Prescriptions Disp Refills  . metFORMIN (GLUCOPHAGE) 1000 MG tablet [Pharmacy Med Name: metFORMIN HCl 1000 MG Oral Tablet] 180 tablet 0    Sig: Take 1 tablet by mouth twice daily     Endocrinology:  Diabetes - Biguanides Failed - 03/28/2020 10:04 AM      Failed - HBA1C is between 0 and 7.9 and within 180 days    Hgb A1c MFr Bld  Date Value Ref Range Status  01/20/2020 8.9 (H) <5.7 % of total Hgb Final    Comment:    For someone without known diabetes, a hemoglobin A1c value of 6.5% or greater indicates that they may have  diabetes and this should be confirmed with a follow-up  test. . For someone with known diabetes, a value <7% indicates  that their diabetes is well controlled and a value  greater than or equal to 7% indicates suboptimal  control. A1c targets should be individualized based on  duration of diabetes, age, comorbid conditions, and  other considerations. . Currently, no consensus exists regarding use of hemoglobin A1c for diagnosis of diabetes for children. .          Passed - Cr in normal range and within 360 days    Creat  Date Value Ref Range Status  01/20/2020 0.91 0.70 - 1.25 mg/dL Final    Comment:    For patients >39 years of age, the reference limit for Creatinine is approximately 13% higher for people identified as African-American. .          Passed - eGFR in normal range and within 360 days    GFR, Est African American  Date Value Ref Range Status  01/20/2020 99 > OR = 60 mL/min/1.9m Final   GFR, Est Non African American  Date Value Ref Range Status  01/20/2020 86 > OR = 60 mL/min/1.77mFinal         Passed - Valid encounter within last 6 months    Recent Outpatient Visits          1 month ago Annual physical exam   [place]   8 months [facility]  [place]   1 year ago [facility]   1 year ago Acute rhinosinusitis   [place]   [facility]

## 2020-04-20 ENCOUNTER — Other Ambulatory Visit: Payer: Self-pay | Admitting: Family Medicine

## 2020-04-20 DIAGNOSIS — I10 Essential (primary) hypertension: Secondary | ICD-10-CM

## 2020-04-20 NOTE — Telephone Encounter (Signed)
Requested Prescriptions  Pending Prescriptions Disp Refills  . amLODipine (NORVASC) 5 MG tablet [Pharmacy Med Name: amLODIPine Besylate 5 MG Oral Tablet] 90 tablet 0    Sig: Take 1 tablet by mouth once daily     Cardiovascular:  Calcium Channel Blockers Failed - 04/20/2020 10:04 AM      Failed - Last BP in normal range    BP Readings from Last 1 Encounters:  01/29/20 140/60         Passed - Valid encounter within last 6 months    Recent Outpatient Visits          2 months ago Annual physical exam   [place], DO   8 months ago Uncontrolled type 2 diabetes mellitus with peripheral neuropathy [facility])   [place], DO   1 year ago Annual physical exam   [facility] Olin Hauser, DO   1 year ago Acute rhinosinusitis   [place], DO   1 year ago Uncontrolled type 2 diabetes mellitus with peripheral neuropathy [facility])   [facility], Devonne Doughty, DO

## 2020-05-02 ENCOUNTER — Other Ambulatory Visit: Payer: Self-pay | Admitting: Family Medicine

## 2020-05-02 DIAGNOSIS — R42 Dizziness and giddiness: Secondary | ICD-10-CM

## 2020-05-02 NOTE — Telephone Encounter (Signed)
Requested Prescriptions  Pending Prescriptions Disp Refills   imipramine (TOFRANIL) 10 MG tablet [Pharmacy Med Name: Imipramine HCl 10 MG Oral Tablet] 90 tablet 0    Sig: TAKE 1 TABLET BY MOUTH AT BEDTIME     Psychiatry:  Antidepressants - Heterocyclics (TCAs) Passed - 05/02/2020  9:54 AM      Passed - Valid encounter within last 6 months    Recent Outpatient Visits          3 months ago Annual physical exam   [place], DO   9 months ago Uncontrolled type 2 diabetes mellitus with peripheral neuropathy [facility])   [place], DO   1 year ago Annual physical exam   [facility] Olin Hauser, DO   1 year ago Acute rhinosinusitis   [place], DO   1 year ago Uncontrolled type 2 diabetes mellitus with peripheral neuropathy [facility])   [facility], Devonne Doughty, DO

## 2020-05-22 ENCOUNTER — Other Ambulatory Visit: Payer: Self-pay | Admitting: Family Medicine

## 2020-05-22 DIAGNOSIS — E1165 Type 2 diabetes mellitus with hyperglycemia: Secondary | ICD-10-CM

## 2020-05-22 DIAGNOSIS — IMO0002 Reserved for concepts with insufficient information to code with codable children: Secondary | ICD-10-CM

## 2020-06-05 ENCOUNTER — Other Ambulatory Visit: Payer: Self-pay | Admitting: Family Medicine

## 2020-06-05 DIAGNOSIS — I10 Essential (primary) hypertension: Secondary | ICD-10-CM

## 2020-06-16 ENCOUNTER — Other Ambulatory Visit: Payer: Self-pay | Admitting: Family Medicine

## 2020-06-16 DIAGNOSIS — IMO0002 Reserved for concepts with insufficient information to code with codable children: Secondary | ICD-10-CM

## 2020-06-16 DIAGNOSIS — E1165 Type 2 diabetes mellitus with hyperglycemia: Secondary | ICD-10-CM

## 2020-06-16 NOTE — Telephone Encounter (Signed)
Requested Prescriptions  Pending Prescriptions Disp Refills  . LANTUS 100 UNIT/ML injection [Pharmacy Med Name: Lantus 100 UNIT/ML Subcutaneous Solution] 20 mL 2    Sig: INJECT 70 UNITS SUBCUTANEOUSLY ONCE DAILY     Endocrinology:  Diabetes - Insulins Failed - 06/16/2020  7:24 PM      Failed - HBA1C is between 0 and 7.9 and within 180 days    Hgb A1c MFr Bld  Date Value Ref Range Status  01/20/2020 8.9 (H) <5.7 % of total Hgb Final    Comment:    For someone without known diabetes, a hemoglobin A1c value of 6.5% or greater indicates that they may have  diabetes and this should be confirmed with a follow-up  test. . For someone with known diabetes, a value <7% indicates  that their diabetes is well controlled and a value  greater than or equal to 7% indicates suboptimal  control. A1c targets should be individualized based on  duration of diabetes, age, comorbid conditions, and  other considerations. . Currently, no consensus exists regarding use of hemoglobin A1c for diagnosis of diabetes for children. Renella Cunas - Valid encounter within last 6 months    Recent Outpatient Visits          4 months ago Annual physical exam   [place], DO   10 months ago Uncontrolled type 2 diabetes mellitus with peripheral neuropathy [facility])   [place], DO   1 year ago Annual physical exam   [facility] Olin Hauser, DO   1 year ago Acute rhinosinusitis   [place], DO   1 year ago Uncontrolled type 2 diabetes mellitus with peripheral neuropathy [facility])   [facility], Devonne Doughty, DO

## 2020-06-30 ENCOUNTER — Other Ambulatory Visit: Payer: Self-pay | Admitting: Family Medicine

## 2020-06-30 DIAGNOSIS — E1140 Type 2 diabetes mellitus with diabetic neuropathy, unspecified: Secondary | ICD-10-CM

## 2020-06-30 DIAGNOSIS — IMO0002 Reserved for concepts with insufficient information to code with codable children: Secondary | ICD-10-CM

## 2020-08-11 ENCOUNTER — Other Ambulatory Visit: Payer: Self-pay | Admitting: Family Medicine

## 2020-08-11 DIAGNOSIS — R42 Dizziness and giddiness: Secondary | ICD-10-CM

## 2020-08-23 ENCOUNTER — Other Ambulatory Visit: Payer: Self-pay | Admitting: Family Medicine

## 2020-08-23 DIAGNOSIS — E1142 Type 2 diabetes mellitus with diabetic polyneuropathy: Secondary | ICD-10-CM

## 2020-08-23 DIAGNOSIS — IMO0002 Reserved for concepts with insufficient information to code with codable children: Secondary | ICD-10-CM

## 2020-08-23 NOTE — Telephone Encounter (Signed)
Requested medication (s) are due for refill today: yes  Requested medication (s) are on the active medication list: yes  Last refill:  01/30/20  Future visit scheduled: no  Notes to clinic:  called pt but mailbox full /needs appt   Requested Prescriptions  Pending Prescriptions Disp Refills   TRULICITY 1.5 HQ/7.5FF SOPN [Pharmacy Med Name: Trulicity 1.5 MB/8.4YK Subcutaneous Solution Pen-injector] 4 mL 0    Sig: INJECT 1.5 MG SUBCUTANEOUSLY ONCE A WEEK      Endocrinology:  Diabetes - GLP-1 Receptor Agonists Failed - 08/23/2020 12:42 PM      Failed - HBA1C is between 0 and 7.9 and within 180 days    Hgb A1c MFr Bld  Date Value Ref Range Status  01/20/2020 8.9 (H) <5.7 % of total Hgb Final    Comment:    For someone without known diabetes, a hemoglobin A1c value of 6.5% or greater indicates that they may have  diabetes and this should be confirmed with a follow-up  test. . For someone with known diabetes, a value <7% indicates  that their diabetes is well controlled and a value  greater than or equal to 7% indicates suboptimal  control. A1c targets should be individualized based on  duration of diabetes, age, comorbid conditions, and  other considerations. . Currently, no consensus exists regarding use of hemoglobin A1c for diagnosis of diabetes for children. .           Failed - Valid encounter within last 6 months    Recent Outpatient Visits           6 months ago Annual physical exam   [place], DO   1 year ago Uncontrolled type 2 diabetes mellitus with peripheral neuropathy [facility])   [place], DO   1 year ago Annual physical exam   [place], DO   1 year ago Acute rhinosinusitis   [place], DO   2 years ago Uncontrolled type 2 diabetes mellitus with peripheral neuropathy [facility])   [facility], Devonne Doughty, DO

## 2020-09-03 LAB — HM DIABETES EYE EXAM

## 2020-09-05 ENCOUNTER — Other Ambulatory Visit: Payer: Self-pay | Admitting: Family Medicine

## 2020-09-05 DIAGNOSIS — I10 Essential (primary) hypertension: Secondary | ICD-10-CM

## 2020-09-05 NOTE — Telephone Encounter (Signed)
Requested Prescriptions  Pending Prescriptions Disp Refills  . losartan (COZAAR) 50 MG tablet [Pharmacy Med Name: Losartan Potassium 50 MG Oral Tablet] 30 tablet 0    Sig: Take 1 tablet by mouth once daily     Cardiovascular:  Angiotensin Receptor Blockers Failed - 09/05/2020 11:40 AM      Failed - Cr in normal range and within 180 days    Creat  Date Value Ref Range Status  01/20/2020 0.91 0.70 - 1.25 mg/dL Final    Comment:    For patients >70 years of age, the reference limit for Creatinine is approximately 13% higher for people identified as African-American. .          Failed - K in normal range and within 180 days    Potassium  Date Value Ref Range Status  01/20/2020 4.4 3.5 - 5.3 mmol/L Final         Failed - Last BP in normal range    BP Readings from Last 1 Encounters:  01/29/20 140/60         Failed - Valid encounter within last 6 months    Recent Outpatient Visits          7 months ago Annual physical exam   [place], DO   1 year ago Uncontrolled type 2 diabetes mellitus with peripheral neuropathy [facility])   [place], DO   1 year ago Annual physical exam   [facility] Olin Hauser, DO   1 year ago Acute rhinosinusitis   [place], DO   2 years ago Uncontrolled type 2 diabetes mellitus with peripheral neuropathy [facility])   [facility], Devonne Doughty, DO             Passed - Patient is not pregnant

## 2020-09-20 ENCOUNTER — Other Ambulatory Visit: Payer: Self-pay | Admitting: Family Medicine

## 2020-09-20 DIAGNOSIS — IMO0002 Reserved for concepts with insufficient information to code with codable children: Secondary | ICD-10-CM

## 2020-09-20 DIAGNOSIS — E1165 Type 2 diabetes mellitus with hyperglycemia: Secondary | ICD-10-CM

## 2020-09-20 DIAGNOSIS — E1121 Type 2 diabetes mellitus with diabetic nephropathy: Secondary | ICD-10-CM

## 2020-09-20 NOTE — Telephone Encounter (Signed)
Requested Prescriptions  Pending Prescriptions Disp Refills   LANTUS 100 UNIT/ML injection [Pharmacy Med Name: Lantus 100 UNIT/ML Subcutaneous Solution] 20 mL 0    Sig: INJECT [place] DAILY     Endocrinology:  Diabetes - Insulins Failed - 09/20/2020  5:27 PM      Failed - HBA1C is between 0 and 7.9 and within 180 days    Hgb A1c MFr Bld  Date Value Ref Range Status  01/20/2020 8.9 (H) <5.7 % of total Hgb Final    Comment:    For someone without known diabetes, a hemoglobin A1c value of 6.5% or greater indicates that they may have  diabetes and this should be confirmed with a follow-up  test. . For someone with known diabetes, a value <7% indicates  that their diabetes is well controlled and a value  greater than or equal to 7% indicates suboptimal  control. A1c targets should be individualized based on  duration of diabetes, age, comorbid conditions, and  other considerations. . Currently, no consensus exists regarding use of hemoglobin A1c for diagnosis of diabetes for children. .          Failed - Valid encounter within last 6 months    Recent Outpatient Visits          7 months ago Annual physical exam   [place], DO   1 year ago Uncontrolled type 2 diabetes mellitus with peripheral neuropathy [facility])   [place], DO   1 year ago Annual physical exam   [place], DO   1 year ago Acute rhinosinusitis   [place], DO   2 years ago Uncontrolled type 2 diabetes mellitus with peripheral neuropathy [facility])   [facility], Devonne Doughty, DO      Future Appointments            In 3 days [place], Devonne Doughty, [facility], [facility]

## 2020-09-23 ENCOUNTER — Ambulatory Visit: Payer: Self-pay | Admitting: Family Medicine

## 2020-09-24 ENCOUNTER — Encounter: Payer: Self-pay | Admitting: Family Medicine

## 2020-09-24 ENCOUNTER — Other Ambulatory Visit: Payer: Self-pay

## 2020-09-24 ENCOUNTER — Ambulatory Visit (INDEPENDENT_AMBULATORY_CARE_PROVIDER_SITE_OTHER): Payer: BC Managed Care – PPO | Admitting: Family Medicine

## 2020-09-24 ENCOUNTER — Other Ambulatory Visit: Payer: Self-pay | Admitting: Family Medicine

## 2020-09-24 VITALS — BP 144/80 | HR 90 | Temp 97.3°F | Resp 16 | Ht 70.0 in | Wt 243.6 lb

## 2020-09-24 DIAGNOSIS — G720 Drug-induced myopathy: Secondary | ICD-10-CM

## 2020-09-24 DIAGNOSIS — I10 Essential (primary) hypertension: Secondary | ICD-10-CM

## 2020-09-24 DIAGNOSIS — IMO0002 Reserved for concepts with insufficient information to code with codable children: Secondary | ICD-10-CM

## 2020-09-24 DIAGNOSIS — E1165 Type 2 diabetes mellitus with hyperglycemia: Secondary | ICD-10-CM | POA: Diagnosis not present

## 2020-09-24 DIAGNOSIS — Z0000 Encounter for general adult medical examination without abnormal findings: Secondary | ICD-10-CM

## 2020-09-24 DIAGNOSIS — M8949 Other hypertrophic osteoarthropathy, multiple sites: Secondary | ICD-10-CM

## 2020-09-24 DIAGNOSIS — E785 Hyperlipidemia, unspecified: Secondary | ICD-10-CM

## 2020-09-24 DIAGNOSIS — E1142 Type 2 diabetes mellitus with diabetic polyneuropathy: Secondary | ICD-10-CM | POA: Diagnosis not present

## 2020-09-24 DIAGNOSIS — M159 Polyosteoarthritis, unspecified: Secondary | ICD-10-CM

## 2020-09-24 DIAGNOSIS — E1169 Type 2 diabetes mellitus with other specified complication: Secondary | ICD-10-CM

## 2020-09-24 LAB — POCT GLYCOSYLATED HEMOGLOBIN (HGB A1C): Hemoglobin A1C: 9.8 % — AB (ref 4.0–5.6)

## 2020-09-24 MED ORDER — TRULICITY 3 MG/0.5ML ~~LOC~~ SOAJ: 3.0000 mg | SUBCUTANEOUS | 5 refills | Status: DC

## 2020-09-24 MED ORDER — TRAMADOL HCL 50 MG PO TABS: 50.0000 mg | ORAL_TABLET | Freq: Three times a day (TID) | ORAL | 2 refills | Status: DC | PRN

## 2020-09-24 NOTE — Assessment & Plan Note (Signed)
Mildly elevated LDL, not at goal The 10-year ASCVD risk score Henry Bussing DC Jr., et al., 2013) is: 47% Failed multiple statins  Plan: 1. Declines statin therapy at this time, has failed multiple due to myalgia 2. Encourage improved lifestyle - low carb/cholesterol, reduce portion size, continue improving regular exercise

## 2020-09-24 NOTE — Assessment & Plan Note (Signed)
Elevated A1c up to 9.8 now Poor diet again No hypoglycemia Complications - peripheral neuropathy L>R Failed: Victoza (cost), Bydureon (local reaction, knot) - Tyler Aas (cost), Ozempic (ineffective), Contraindicated SGLT2 (bladder cancer)  Plan:  - INCREASE Trulicity from 1.5 up to 3mg  weekly - new rx - Continue Lantus 70u daily eventually goal to reduce dose - Continue Metformin 1000mg  BID 2. Encourage improved lifestyle - low carb, low sugar diet, reduce portion size, continue improving regular exercise 3. Check CBG , bring log to next visit for review - Continue Freestyle Libre 4. Continue ARB - consider ASA, Statin in future again or alternative PSK9 inhib - he has declined due to statin intolerance 5. Last updated DM [place] in [place] request copy

## 2020-09-24 NOTE — Progress Notes (Signed)
Subjective:    Patient ID: Henry Horne, male    DOB: 1950-12-11, 69 y.o.   MRN: [id number]  Henry Horne is a 69 y.o. male presenting on 09/24/2020 for Diabetes   HPI  CHRONIC DM, Type 2with history of DM Neuropathy Last visit 01/2020 He is doing well but admits not adhering to lifestyle - Lab results A1c previously 10.3 then 8.9 now due today CBGs:reviewed - He has Freestyle [place] sensor and keeping track of it. Meds:Lantus70[place]ning,Trulicity 1.5mg  weekly [place], Metformin 1000mg  BID Reports good compliance.Tolerating well w/o side-effects Currently on ARB Lifestyle: - Diet (Improving diet overall, limiting sugars/carbs - admits poor diet often, more sweets) - Exercise (limited regular exercise,knee chronic joint pain) -LastDM Eye exam [place] in [place] request copy - Prior history of nerve testing L>R reduced sensation Admits some urinary frequency with hyperglycemia, but improved when sugar reduced Admits some numbness tingling feet Denies hypoglycemia  HYPERLIPIDEMIA/ Obesity BMI >35 - Reports no concerns. Last lipid panel2/2021,mild elevated LDL but stable to improved - Not on statin therapy, failed multiple in past due to statin myalgia intolerance  CHRONIC HTN: Reportsno recent BP numbers at home. He has elevated reading. Current Meds -Losartan 50mg  daily, Amlodipine 5mg  daily Reports good compliance, took meds today. Tolerating well, w/o complaints. 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 No recurrence. Followed by Henry Horne, last seen 12/19/19 now in [place], has traveled to see same provider. He will do yearly  cystoscopy.  Health Maintenance: UTD Flu vaccine  UTD COVID vaccine will get updated information.    Depression screen [facility] 2/9 09/24/2020 01/29/2020 07/26/2019  Decreased Interest 0 1 0  Down, Depressed, Hopeless 0 1 0  PHQ - 2 Score 0 2 0  Altered sleeping - 2 -  Tired, decreased energy - 2 -  Change in appetite - 1 -  Feeling bad or failure about yourself  - 1 -  Trouble concentrating - 1 -  Moving slowly or fidgety/restless - 1 -  Suicidal thoughts - 1 -  PHQ-9 Score - 11 -  Difficult doing work/chores - Somewhat difficult -  Some recent data might be hidden    Social History   Tobacco Use  . Smoking status: Former Smoker    Years: 30.00    Quit date: 05/18/2004    Years since quitting: 16.3  . Smokeless tobacco: Former Network engineer  . Vaping Use: Never used  Substance Use Topics  . Alcohol use: No    Alcohol/week: 0.0 standard drinks  . Drug use: No    Review of Systems Per HPI unless specifically indicated above     Objective:    BP (!) 144/80   Pulse 90   Temp (!) 97.3 F (36.3 C) (Temporal)   Resp 16   Ht 5\' 10"  (1.778 m)   Wt 243 lb 9.6 oz (110.5 kg)   SpO2 98%   BMI 34.95 kg/m   Wt Readings from Last 3 Encounters:  09/24/20 243 lb 9.6 oz (110.5 kg)  01/29/20 244 lb (110.7 kg)  07/26/19 239 lb (108.4 kg)    Physical Exam Vitals and nursing note reviewed.  Constitutional:      General: He is not in acute distress.  Appearance: He is well-developed. He is obese. He is not diaphoretic.     Comments: Well-appearing, comfortable, cooperative  HENT:     Head: Normocephalic and atraumatic.  Eyes:     General:        Right eye: No discharge.        Left eye: No discharge.     Conjunctiva/sclera: Conjunctivae normal.  Neck:     Thyroid: No thyromegaly.  Cardiovascular:     Rate and Rhythm: Normal rate and regular rhythm.     Heart sounds: Normal heart sounds. No murmur heard.   Pulmonary:     Effort: Pulmonary effort is normal. No  respiratory distress.     Breath sounds: Normal breath sounds. No wheezing or rales.  Musculoskeletal:        General: Normal range of motion.     Cervical back: Normal range of motion and neck supple.  Lymphadenopathy:     Cervical: No cervical adenopathy.  Skin:    General: Skin is warm and dry.     Findings: No erythema or rash.  Neurological:     Mental Status: He is alert and oriented to person, place, and time.  Psychiatric:        Behavior: Behavior normal.     Comments: Well groomed, good eye contact, normal speech and thoughts      Diabetic Foot Exam - Simple   Simple Foot Form Diabetic Foot exam was performed with the following findings: Yes 09/24/2020  9:49 AM  Visual Inspection See comments: Yes Sensation Testing Intact to touch and monofilament testing bilaterally: Yes Pulse Check Posterior Tibialis and Dorsalis pulse intact bilaterally: Yes Comments Bilateral feet with callus heels and forefoot great toe, no ulceration. Intact monofilament.      Results for orders placed or performed in visit on 09/24/20  POCT HgB A1C  Result Value Ref Range   Hemoglobin A1C 9.8 (A) 4.0 - 5.6 %      Assessment & Plan:   Problem List Items Addressed This Visit    Uncontrolled type 2 diabetes mellitus with peripheral neuropathy (HCC) - Primary    Elevated A1c up to 9.8 now Poor diet again No hypoglycemia Complications - peripheral neuropathy L>R Failed: Victoza (cost), Bydureon (local reaction, knot) - Tyler Aas (cost), Ozempic (ineffective), Contraindicated SGLT2 (bladder cancer)  Plan:  - INCREASE Trulicity from 1.5 up to 3mg  weekly - new rx - Continue Lantus 70u daily eventually goal to reduce dose - Continue Metformin 1000mg  BID 2. Encourage improved lifestyle - low carb, low sugar diet, reduce portion size, continue improving regular exercise 3. Check CBG , bring log to next visit for review - Continue Freestyle Libre 4. Continue ARB - consider ASA, Statin in  future again or alternative PSK9 inhib - he has declined due to statin intolerance 5. Last updated DM [place] in [place] request copy      Relevant Medications   TRULICITY 3 HD/6.2IW SOPN   Other Relevant Orders   POCT HgB A1C (Completed)   Osteoarthritis of multiple joints    Improved control on Tramadol / Diclofenac topical, limited other options Underlying etiology of chronic pain with OA/DJD multiple joints Affecting knees, feet, and other joints. Imaging with bone scan 2008 for cancer found reduced cartilage on joints No recent X-rays Improved on Tramadol in past - some nausea - refill Tramadol, checked PDMP Failed multiple NSAIDs  Follow-up future re-eval consider steroid inj knee/shoulders, future PT, ortho as need  Relevant Medications   traMADol (ULTRAM) 50 MG tablet   Hyperlipidemia associated with type 2 diabetes mellitus ([place])    Mildly elevated LDL, not at goal The 10-year ASCVD risk score Mikey Bussing DC Jr., et al., 2013) is: 47% Failed multiple statins  Plan: 1. Declines statin therapy at this time, has failed multiple due to myalgia 2. Encourage improved lifestyle - low carb/cholesterol, reduce portion size, continue improving regular exercise      Relevant Medications   TRULICITY 3 MK/3.4JZ SOPN   Essential hypertension   Drug-induced myopathy    Failed multiple statin due to myalgia         Meds ordered this encounter  Medications  . TRULICITY 3 PH/1.5AV SOPN    Sig: Inject 3 mg as directed once a week.    Dispense:  2 mL    Refill:  5    Dose increase from 1.5 up to 3mg , patient is not yet due for refill or dose change yet. Keep on file  . traMADol (ULTRAM) 50 MG tablet    Sig: Take 1 tablet (50 mg total) by mouth every 8 (eight) hours as needed.    Dispense:  30 tablet    Refill:  2      Follow up plan: Return in about 6 months (around 03/25/2021) for 6 month fasting lab only then 1 week later Annual Physical.  Future labs ordered for  03/18/21   [name], [facility] 09/24/2020, 9:36 AM

## 2020-09-24 NOTE — Patient Instructions (Addendum)
Thank you for coming to the office today.  Dose increase Trulicity from 1.5 up to 3.0mg  fill when ready, future can possibly consider 4.5 if needed  Goal to reduce dietary sugars and reduce Insulin lantus from 70 u in future  Please send copy or write down dates of COVID vaccine card to [facility] or message  DUE for FASTING BLOOD WORK (no food or drink after midnight before the lab appointment, only water or coffee without cream/sugar on the morning of)  SCHEDULE "Lab Only" visit in the morning at the clinic for lab draw in 6 MONTHS   - Make sure Lab Only appointment is at about 1 week before your next appointment, so that results will be available  For Lab Results, once available within 2-3 days of blood draw, you can can log in to MyChart online to view your results and a brief explanation. Also, we can discuss results at next follow-up visit.   Please schedule a Follow-up Appointment to: Return in about 6 months (around 03/25/2021) for 6 month fasting lab only then 1 week later Annual Physical.  If you have any other questions or concerns, please feel free to call the office or send a message through [place]. You may also schedule an earlier appointment if necessary.  Additionally, you may be receiving a survey about your experience at our office within a few days to 1 week by e-mail or mail. We value your feedback.  [name], DO [place]

## 2020-09-24 NOTE — Assessment & Plan Note (Signed)
Improved control on Tramadol / Diclofenac topical, limited other options Underlying etiology of chronic pain with OA/DJD multiple joints Affecting knees, feet, and other joints. Imaging with bone scan 2008 for cancer found reduced cartilage on joints No recent X-rays Improved on Tramadol in past - some nausea - refill Tramadol, checked PDMP Failed multiple NSAIDs  Follow-up future re-eval consider steroid inj knee/shoulders, future PT, ortho as need

## 2020-09-24 NOTE — Assessment & Plan Note (Signed)
Failed multiple statin due to myalgia

## 2020-10-03 ENCOUNTER — Other Ambulatory Visit: Payer: Self-pay | Admitting: Family Medicine

## 2020-10-03 DIAGNOSIS — E1140 Type 2 diabetes mellitus with diabetic neuropathy, unspecified: Secondary | ICD-10-CM

## 2020-10-03 DIAGNOSIS — R451 Restlessness and agitation: Secondary | ICD-10-CM

## 2020-10-03 DIAGNOSIS — I10 Essential (primary) hypertension: Secondary | ICD-10-CM

## 2020-10-03 DIAGNOSIS — IMO0002 Reserved for concepts with insufficient information to code with codable children: Secondary | ICD-10-CM

## 2020-10-03 NOTE — Telephone Encounter (Signed)
Requested Prescriptions  Pending Prescriptions Disp Refills   losartan (COZAAR) 50 MG tablet [Pharmacy Med Name: Losartan Potassium 50 MG Oral Tablet] 30 tablet 0    Sig: Take 1 tablet by mouth once daily     Cardiovascular:  Angiotensin Receptor Blockers Failed - 10/03/2020  9:10 AM      Failed - Cr in normal range and within 180 days    Creat  Date Value Ref Range Status  01/20/2020 0.91 0.70 - 1.25 mg/dL Final    Comment:    For patients >70 years of age, the reference limit for Creatinine is approximately 13% higher for people identified as African-American. .          Failed - K in normal range and within 180 days    Potassium  Date Value Ref Range Status  01/20/2020 4.4 3.5 - 5.3 mmol/L Final         Failed - Last BP in normal range    BP Readings from Last 1 Encounters:  09/24/20 (!) 144/80         Passed - Patient is not pregnant      Passed - Valid encounter within last 6 months    Recent Outpatient Visits          1 week ago Uncontrolled type 2 diabetes mellitus with peripheral neuropathy ([place])   [facility] [place], Devonne Doughty, DO   8 months ago Annual physical exam   [place], DO   1 year ago Uncontrolled type 2 diabetes mellitus with peripheral neuropathy ([place])   [place], DO   1 year ago Annual physical exam   [place], DO   1 year ago Acute rhinosinusitis   [place], DO      Future Appointments            In 5 months [place], Devonne Doughty, DO [facility], [facility]            metFORMIN (GLUCOPHAGE) 1000 MG tablet [Pharmacy Med Name: metFORMIN HCl 1000 MG Oral Tablet] 180 tablet 0    Sig: Take 1 tablet by mouth twice daily     Endocrinology:  Diabetes - Biguanides Failed - 10/03/2020  9:10 AM      Failed - HBA1C is between 0 and 7.9 and  within 180 days    Hemoglobin A1C  Date Value Ref Range Status  09/24/2020 9.8 (A) 4.0 - 5.6 % Final   Hgb A1c MFr Bld  Date Value Ref Range Status  01/20/2020 8.9 (H) <5.7 % of total Hgb Final    Comment:    For someone without known diabetes, a hemoglobin A1c value of 6.5% or greater indicates that they may have  diabetes and this should be confirmed with a follow-up  test. . For someone with known diabetes, a value <7% indicates  that their diabetes is well controlled and a value  greater than or equal to 7% indicates suboptimal  control. A1c targets should be individualized based on  duration of diabetes, age, comorbid conditions, and  other considerations. . Currently, no consensus exists regarding use of hemoglobin A1c for diagnosis of diabetes for children. .          Passed - Cr in normal range and within 360 days    Creat  Date Value Ref Range Status  01/20/2020 0.91 0.70 -  1.25 mg/dL Final    Comment:    For patients >33 years of age, the reference limit for Creatinine is approximately 13% higher for people identified as African-American. .          Passed - eGFR in normal range and within 360 days    GFR, Est African American  Date Value Ref Range Status  01/20/2020 99 > OR = 60 mL/min/1.47m Final   GFR, Est Non African American  Date Value Ref Range Status  01/20/2020 86 > OR = 60 mL/min/1.746mFinal         Passed - Valid encounter within last 6 months    Recent Outpatient Visits          1 week ago Uncontrolled type 2 diabetes mellitus with peripheral neuropathy ([place]  [facility]   8 months ago Annual physical exam   [facility]   1 year ago Uncontrolled type 2 diabetes mellitus with peripheral neuropathy ([facility] [place]  [place]   1 year ago Annual physical exam   [place]   1 year ago Acute rhinosinusitis   [place]      Future Appointments            In 5 months KaParks RangerAlDevonne DoughtyDO [facility]            FLUoxetine (PROZAC) 40 MG capsule [Pharmacy Med Name: FLUoxetine HCl 40 MG Oral Capsule] 90 capsule 0    Sig: Take 1 capsule by mouth once daily     Psychiatry:  Antidepressants - SSRI Passed - 10/03/2020  9:10 AM      Passed - Valid encounter within last 6 months    Recent Outpatient Visits          1 week ago Uncontrolled type 2 diabetes mellitus with peripheral neuropathy ([place]  [facility]   8 months ago Annual physical exam   [place]ought[place]DO   1 year ago [facility]controlled type [facility] with peripheral [facility] ([facility]  So[place]Al[place]onne DoughtyDO   1 year ago Annual physical exam   [facility]   1 year ago Acute rhinosinusitis   [place]      Future Appointments            In 5 months KaParks RangerAlDevonne DoughtyDOF[facility]            amLODipine (NORVASC) 5 MG tablet [Pharmacy Med Name: amLODIPine Besylate 5 MG Oral Tablet] 90 tablet 0    Sig: Take 1 tablet by mouth once daily     Cardiovascular:  Calcium Channel Blockers Failed - 10/03/2020  9:10 AM      Failed - Last BP in normal range    BP Readings from Last 1 Encounters:  09/24/20 (!) 144/80         Passed - Valid encounter within last 6 months    Recent Outpatient Visits          1 week ago Uncontrolled type 2 diabetes mellitus with peripheral [facility] ([facility]  [place] DoughtyDO  8 months ago Annual physical exam   [facility] [place], Devonne Doughty, DO   1 year ago Uncontrolled  type 2 diabetes mellitus with peripheral neuropathy [facility])   [facility] [place], Devonne Doughty, DO   1 year ago Annual physical exam   [place], DO   1 year ago Acute rhinosinusitis   [facility] [place], Devonne Doughty, DO      Future Appointments            In 5 months Parks Ranger, Devonne Doughty, [facility], [facility]

## 2020-10-23 ENCOUNTER — Other Ambulatory Visit: Payer: Self-pay | Admitting: Family Medicine

## 2020-10-23 DIAGNOSIS — E1121 Type 2 diabetes mellitus with diabetic nephropathy: Secondary | ICD-10-CM

## 2020-10-23 DIAGNOSIS — E1165 Type 2 diabetes mellitus with hyperglycemia: Secondary | ICD-10-CM

## 2020-10-23 DIAGNOSIS — IMO0002 Reserved for concepts with insufficient information to code with codable children: Secondary | ICD-10-CM

## 2020-11-06 ENCOUNTER — Encounter: Payer: Self-pay | Admitting: Family Medicine

## 2020-11-14 ENCOUNTER — Other Ambulatory Visit: Payer: Self-pay | Admitting: Family Medicine

## 2020-11-14 DIAGNOSIS — R42 Dizziness and giddiness: Secondary | ICD-10-CM

## 2020-11-14 NOTE — Telephone Encounter (Signed)
Pt has an appt in April Requested Prescriptions  Pending Prescriptions Disp Refills  . imipramine (TOFRANIL) 10 MG tablet [Pharmacy Med Name: Imipramine HCl 10 MG Oral Tablet] 90 tablet 0    Sig: TAKE 1 TABLET BY MOUTH AT BEDTIME. SCHEDULE OFFICE VISIT FOR ADDITIONAL REFILLS.     Psychiatry:  Antidepressants - Heterocyclics (TCAs) Passed - 11/14/2020  6:30 PM      Passed - Valid encounter within last 6 months    Recent Outpatient Visits          1 month ago Uncontrolled type 2 diabetes mellitus with peripheral neuropathy [facility])   [place], DO   9 months ago Annual physical exam   [place], DO   1 year ago Uncontrolled type 2 diabetes mellitus with peripheral neuropathy [facility])   [facility] Olin Hauser, DO   1 year ago Annual physical exam   [place], DO   1 year ago Acute rhinosinusitis   [facility] Olin Hauser, DO      Future Appointments            In 4 months Parks Ranger, Devonne Doughty, [facility], [facility]

## 2020-11-25 ENCOUNTER — Other Ambulatory Visit: Payer: Self-pay | Admitting: Family Medicine

## 2020-11-25 DIAGNOSIS — IMO0002 Reserved for concepts with insufficient information to code with codable children: Secondary | ICD-10-CM

## 2020-11-25 DIAGNOSIS — E1165 Type 2 diabetes mellitus with hyperglycemia: Secondary | ICD-10-CM

## 2021-01-02 ENCOUNTER — Other Ambulatory Visit: Payer: Self-pay | Admitting: Family Medicine

## 2021-01-02 DIAGNOSIS — E1142 Type 2 diabetes mellitus with diabetic polyneuropathy: Secondary | ICD-10-CM

## 2021-01-02 DIAGNOSIS — IMO0002 Reserved for concepts with insufficient information to code with codable children: Secondary | ICD-10-CM

## 2021-01-03 ENCOUNTER — Other Ambulatory Visit: Payer: Self-pay | Admitting: Family Medicine

## 2021-01-03 DIAGNOSIS — IMO0002 Reserved for concepts with insufficient information to code with codable children: Secondary | ICD-10-CM

## 2021-01-03 DIAGNOSIS — E1142 Type 2 diabetes mellitus with diabetic polyneuropathy: Secondary | ICD-10-CM

## 2021-01-05 ENCOUNTER — Other Ambulatory Visit: Payer: Self-pay | Admitting: Family Medicine

## 2021-01-05 DIAGNOSIS — E1165 Type 2 diabetes mellitus with hyperglycemia: Secondary | ICD-10-CM

## 2021-01-05 DIAGNOSIS — IMO0002 Reserved for concepts with insufficient information to code with codable children: Secondary | ICD-10-CM

## 2021-01-05 DIAGNOSIS — E1142 Type 2 diabetes mellitus with diabetic polyneuropathy: Secondary | ICD-10-CM

## 2021-01-06 ENCOUNTER — Telehealth: Payer: Self-pay | Admitting: Family Medicine

## 2021-01-06 ENCOUNTER — Telehealth: Payer: Self-pay

## 2021-01-06 DIAGNOSIS — IMO0002 Reserved for concepts with insufficient information to code with codable children: Secondary | ICD-10-CM

## 2021-01-06 DIAGNOSIS — E1142 Type 2 diabetes mellitus with diabetic polyneuropathy: Secondary | ICD-10-CM

## 2021-01-06 MED ORDER — TRULICITY 3 MG/0.5ML ~~LOC~~ SOAJ
3.0000 mg | SUBCUTANEOUS | 5 refills | Status: DC
Start: 2021-01-06 — End: 2021-09-06

## 2021-01-06 NOTE — Telephone Encounter (Signed)
Medication Refill - Medication: Trulicity  Has the patient contacted their pharmacy? Yes.   (Agent: If no, request that the patient contact the pharmacy for the refill.) (Agent: If yes, when and what did the pharmacy advise?)He has had his pharmacy send over 3 request all which were denied. Spoke with Apolonio Schneiders at the office and she stated it could have been because Dr. Raliegh Ip was out of the office. She asked me to send it again.  Preferred Pharmacy (with phone number or street name):[facility] [place], [place]: Please be advised that RX refills may take up to 3 business days. We ask that you follow-up with your pharmacy.

## 2021-01-06 NOTE — Telephone Encounter (Signed)
error 

## 2021-02-03 ENCOUNTER — Ambulatory Visit (INDEPENDENT_AMBULATORY_CARE_PROVIDER_SITE_OTHER): Payer: BC Managed Care – PPO | Admitting: Family Medicine

## 2021-02-03 ENCOUNTER — Encounter: Payer: Self-pay | Admitting: Family Medicine

## 2021-02-03 ENCOUNTER — Other Ambulatory Visit: Payer: Self-pay

## 2021-02-03 VITALS — BP 129/60 | HR 73 | Ht 70.5 in | Wt 231.4 lb

## 2021-02-03 DIAGNOSIS — R2689 Other abnormalities of gait and mobility: Secondary | ICD-10-CM

## 2021-02-03 DIAGNOSIS — H6983 Other specified disorders of Eustachian tube, bilateral: Secondary | ICD-10-CM | POA: Diagnosis not present

## 2021-02-03 DIAGNOSIS — L720 Epidermal cyst: Secondary | ICD-10-CM | POA: Diagnosis not present

## 2021-02-03 MED ORDER — FLUTICASONE PROPIONATE 50 MCG/ACT NA SUSP: 2.0000 | Freq: Every day | NASAL | 3 refills | Status: DC

## 2021-02-03 NOTE — Progress Notes (Signed)
Subjective:    Patient ID: Henry Horne, male    DOB: 01/27/1951, 70 y.o.   MRN: [id number]  Henry Horne is a 70 y.o. male presenting on 02/03/2021 for Cyst (On the back)   HPI   Balance Disorder Eustachian Tube Dysfunction Reports recently worse balance, he did have diarrhea viral gastro over weekend and reduced intake, now much better. He still has issue with balance, admits ear fullness and pressure without pain L ear worse than R. No longer on nasal steroid Denies loss of hearing, or ear drainage, fever chills  Epidermoid Cyst on Back Uninfected Chronic problem for few years now, upper left back near spine with large cyst has grown over years, has had a spontaneous drainage episode recently past 3-4 days, with some thicker sebaceous material, no redness or pus or fever or concern of infection.   Depression screen [facility] 2/9 09/24/2020 01/29/2020 07/26/2019  Decreased Interest 0 1 0  Down, Depressed, Hopeless 0 1 0  PHQ - 2 Score 0 2 0  Altered sleeping - 2 -  Tired, decreased energy - 2 -  Change in appetite - 1 -  Feeling bad or failure about yourself  - 1 -  Trouble concentrating - 1 -  Moving slowly or fidgety/restless - 1 -  Suicidal thoughts - 1 -  PHQ-9 Score - 11 -  Difficult doing work/chores - Somewhat difficult -  Some recent data might be hidden    Social History   Tobacco Use  . Smoking status: Former Smoker    Years: 30.00    Quit date: 05/18/2004    Years since quitting: 16.7  . Smokeless tobacco: Former Network engineer  . Vaping Use: Never used  Substance Use Topics  . Alcohol use: No    Alcohol/week: 0.0 standard drinks  . Drug use: No    Review of Systems Per HPI unless specifically indicated above     Objective:    BP 129/60   Pulse 73   Ht 5' 10.5" (1.791 m)   Wt 231 lb 6.4 oz (105 kg)   SpO2 100%   BMI 32.73 kg/m   Wt Readings from Last 3 Encounters:  02/03/21 231 lb 6.4 oz (105 kg)  09/24/20 243 lb 9.6 oz (110.5 kg)   01/29/20 244 lb (110.7 kg)    Physical Exam Vitals and nursing note reviewed.  Constitutional:      General: He is not in acute distress.    Appearance: He is well-developed and well-nourished. He is obese. He is not diaphoretic.     Comments: Well-appearing, comfortable, cooperative  HENT:     Head: Normocephalic and atraumatic.     Right Ear: Ear canal and external ear normal. There is no impacted cerumen.     Left Ear: Ear canal and external ear normal. There is no impacted cerumen.     Ears:     Comments: Bilateral TM with mild cloudy appearance with effusion, R>L some fullness. No erythema. No other abnormality.    Mouth/Throat:     Mouth: Oropharynx is clear and moist.  Eyes:     General:        Right eye: No discharge.        Left eye: No discharge.     Conjunctiva/sclera: Conjunctivae normal.  Cardiovascular:     Rate and Rhythm: Normal rate.  Pulmonary:     Effort: Pulmonary effort is normal.  Musculoskeletal:  General: No edema.  Skin:    General: Skin is warm and dry.     Findings: Lesion (Left upper back left of spine with 2 x 2 cm area of improved epidermoid cyst with slight skin pore that has healed up, no further drainage, mild residual induration, no erythema, non tender, no fluctuance) present. No erythema or rash.  Neurological:     Mental Status: He is alert and oriented to person, place, and time.  Psychiatric:        Mood and Affect: Mood and affect normal.        Behavior: Behavior normal.     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  He has imbalance issue with this most likely  Plan: 1. Start Flonase 2 sprays each nare daily for up to 4-6 weeks or longer 2. Start OTC oral anti-histamine daily 3. Recommend may consider trial OTC oral decongestant for up to 1 week or less  Follow-up if not improved or worsening, return criteria  #Epidermoid Cyst of Back Seems acute episode has resolved Cyst structure likely still intact deeper, since only spontaneous drainage Non tender No infection Reassurance today. Offer Gen Surgery referral for definitive treatment excision, vs observation for now and monitoring. He opts to watch the spot and if worse or new concern can return and follow-up. Or can refer.   Meds ordered this encounter  Medications  . fluticasone (FLONASE) 50 MCG/ACT nasal spray    Sig: Place 2 sprays into both nostrils daily. Use for 4-6 weeks then stop and use seasonally or as needed.    Dispense:  16 g    Refill:  3      Follow up plan: Return if symptoms worsen or fail to improve, for keep upcoming apt in April 2022.  [name], [facility] 02/03/2021, 2:08 PM

## 2021-02-03 NOTE — Patient Instructions (Addendum)
Thank you for coming to the office today.  You have some Eustachian Tube Dysfunction, this problem is usually caused by some deeper sinus swelling and pressure, causing difficulty of eustachian tubes to clear fluid from behind ear drum. You can have ear pain, pressure, fullness, loss of hearing. Often related to sinus symptoms and sometimes with sinusitis or infection or allergy symptoms.  Treatment: - Start using nasal steroid spray, Flonase 2 sprays in each nostril every day for at least 4-6 weeks, and maybe longer - Start OTC allergy medicine (Claritin, Zyrtec, or Allegra - or generics) once daily - For significant ear/sinus pressure, try OTC decongestant such as Phenylephrine or similar medicines, included in DayQuil, take this for up to 1 week or less, no longer - AVOID Afrin nasal spray, if you use this do not use more than 3 days or can make sinus swelling worse  Epidermoid cyst on Back is OKAY - does not look infected, it has drained well, probably the capsule of the cyst will stay - may take a long time to fill back up, and may never bother you, or it could come back quicker, if it bothers you and or infected we can refer to Gen Surgery for excision removal.  Please schedule a Follow-up Appointment to: Return if symptoms worsen or fail to improve, for keep upcoming apt in April 2022.  If you have any other questions or concerns, please feel free to call the office or send a message through [place]. You may also schedule an earlier appointment if necessary.  Additionally, you may be receiving a survey about your experience at our office within a few days to 1 week by e-mail or mail. We value your feedback.  [name], DO [place]

## 2021-02-23 ENCOUNTER — Other Ambulatory Visit: Payer: Self-pay | Admitting: Family Medicine

## 2021-02-23 DIAGNOSIS — R42 Dizziness and giddiness: Secondary | ICD-10-CM

## 2021-03-14 ENCOUNTER — Other Ambulatory Visit: Payer: Self-pay | Admitting: Family Medicine

## 2021-03-14 DIAGNOSIS — M8949 Other hypertrophic osteoarthropathy, multiple sites: Secondary | ICD-10-CM

## 2021-03-14 DIAGNOSIS — M159 Polyosteoarthritis, unspecified: Secondary | ICD-10-CM

## 2021-03-14 NOTE — Telephone Encounter (Signed)
Requested medication (s) are due for refill today: yes  Requested medication (s) are on the active medication list: yes  Last refill:  09/24/20  Future visit scheduled: yes  Notes to clinic:  med not delegated to NT to RF   Requested Prescriptions  Pending Prescriptions Disp Refills   traMADol (ULTRAM) 50 MG tablet [Pharmacy Med Name: traMADol HCl 50 MG Oral Tablet] 30 tablet 0    Sig: TAKE 1 TABLET BY MOUTH EVERY 8 HOURS AS NEEDED      Not Delegated - Analgesics:  Opioid Agonists Failed - 03/14/2021 11:10 AM      Failed - This refill cannot be delegated      Failed - Urine Drug Screen completed in last 360 days      Passed - Valid encounter within last 6 months    Recent Outpatient Visits           1 month ago Epidermoid cyst of skin of back   [place], DO   5 months ago Uncontrolled type 2 diabetes mellitus with peripheral neuropathy [facility])   [place], DO   1 year ago Annual physical exam   [place], DO   1 year ago Uncontrolled type 2 diabetes mellitus with peripheral neuropathy [facility])   [facility] Olin Hauser, DO   2 years ago Annual physical exam   [place], DO       Future Appointments             In 1 week [place], Devonne Doughty, DO [facility]

## 2021-03-18 ENCOUNTER — Other Ambulatory Visit: Payer: BC Managed Care – PPO

## 2021-03-18 ENCOUNTER — Other Ambulatory Visit: Payer: Self-pay

## 2021-03-18 DIAGNOSIS — I10 Essential (primary) hypertension: Secondary | ICD-10-CM

## 2021-03-18 DIAGNOSIS — M150 Primary generalized (osteo)arthritis: Secondary | ICD-10-CM

## 2021-03-18 DIAGNOSIS — E1165 Type 2 diabetes mellitus with hyperglycemia: Secondary | ICD-10-CM

## 2021-03-18 DIAGNOSIS — E785 Hyperlipidemia, unspecified: Secondary | ICD-10-CM

## 2021-03-18 DIAGNOSIS — Z0000 Encounter for general adult medical examination without abnormal findings: Secondary | ICD-10-CM

## 2021-03-18 DIAGNOSIS — E1142 Type 2 diabetes mellitus with diabetic polyneuropathy: Secondary | ICD-10-CM

## 2021-03-18 DIAGNOSIS — M159 Polyosteoarthritis, unspecified: Secondary | ICD-10-CM

## 2021-03-18 DIAGNOSIS — E1169 Type 2 diabetes mellitus with other specified complication: Secondary | ICD-10-CM

## 2021-03-18 DIAGNOSIS — M8949 Other hypertrophic osteoarthropathy, multiple sites: Secondary | ICD-10-CM

## 2021-03-18 DIAGNOSIS — IMO0002 Reserved for concepts with insufficient information to code with codable children: Secondary | ICD-10-CM

## 2021-03-19 LAB — COMPLETE METABOLIC PANEL WITH GFR
AG Ratio: 1.8 (calc) (ref 1.0–2.5)
ALT: 22 U/L (ref 9–46)
AST: 16 U/L (ref 10–35)
Albumin: 3.9 g/dL (ref 3.6–5.1)
Alkaline phosphatase (APISO): 65 U/L (ref 35–144)
BUN: 14 mg/dL (ref 7–25)
CO2: 30 mmol/L (ref 20–32)
Calcium: 8.6 mg/dL (ref 8.6–10.3)
Chloride: 102 mmol/L (ref 98–110)
Creat: 0.81 mg/dL (ref 0.70–1.18)
GFR, Est African American: 104 mL/min/{1.73_m2} (ref >=60)
GFR, Est Non African American: 90 mL/min/{1.73_m2} (ref >=60)
Globulin: 2.2 g/dL (calc) (ref 1.9–3.7)
Glucose, Bld: 139 mg/dL — ABNORMAL HIGH (ref 65–99)
Potassium: 4.2 mmol/L (ref 3.5–5.3)
Sodium: 139 mmol/L (ref 135–146)
Total Bilirubin: 0.6 mg/dL (ref 0.2–1.2)
Total Protein: 6.1 g/dL (ref 6.1–8.1)

## 2021-03-19 LAB — CBC WITH DIFFERENTIAL/PLATELET
Absolute Monocytes: 781 cells/uL (ref 200–950)
Basophils Absolute: 59 cells/uL (ref 0–200)
Basophils Relative: 0.7 %
Eosinophils Absolute: 302 cells/uL (ref 15–500)
Eosinophils Relative: 3.6 %
HCT: 46.2 % (ref 38.5–50.0)
Hemoglobin: 14.5 g/dL (ref 13.2–17.1)
Lymphs Abs: 1428 cells/uL (ref 850–3900)
MCH: 26.1 pg — ABNORMAL LOW (ref 27.0–33.0)
MCHC: 31.4 g/dL — ABNORMAL LOW (ref 32.0–36.0)
MCV: 83.1 fL (ref 80.0–100.0)
MPV: 10.3 fL (ref 7.5–12.5)
Monocytes Relative: 9.3 %
Neutro Abs: 5830 cells/uL (ref 1500–7800)
Neutrophils Relative %: 69.4 %
Platelets: 252 10*3/uL (ref 140–400)
RBC: 5.56 10*6/uL (ref 4.20–5.80)
RDW: 13.2 % (ref 11.0–15.0)
Total Lymphocyte: 17 %
WBC: 8.4 10*3/uL (ref 3.8–10.8)

## 2021-03-19 LAB — HEMOGLOBIN A1C
Hgb A1c MFr Bld: 8.3 % of total Hgb — ABNORMAL HIGH (ref <5.7)
Mean Plasma Glucose: 192 mg/dL
eAG (mmol/L): 10.6 mmol/L

## 2021-03-19 LAB — LIPID PANEL
Cholesterol: 187 mg/dL (ref <200)
HDL: 35 mg/dL — ABNORMAL LOW (ref >=40)
LDL Cholesterol (Calc): 130 mg/dL (calc) — ABNORMAL HIGH
Non-HDL Cholesterol (Calc): 152 mg/dL (calc) — ABNORMAL HIGH (ref <130)
Total CHOL/HDL Ratio: 5.3 (calc) — ABNORMAL HIGH (ref <5.0)
Triglycerides: 108 mg/dL (ref <150)

## 2021-03-25 ENCOUNTER — Other Ambulatory Visit: Payer: Self-pay

## 2021-03-25 ENCOUNTER — Encounter: Payer: Self-pay | Admitting: Family Medicine

## 2021-03-25 ENCOUNTER — Ambulatory Visit (INDEPENDENT_AMBULATORY_CARE_PROVIDER_SITE_OTHER): Payer: BC Managed Care – PPO | Admitting: Family Medicine

## 2021-03-25 VITALS — BP 140/70 | HR 75 | Ht 70.5 in | Wt 237.0 lb

## 2021-03-25 DIAGNOSIS — E785 Hyperlipidemia, unspecified: Secondary | ICD-10-CM

## 2021-03-25 DIAGNOSIS — I10 Essential (primary) hypertension: Secondary | ICD-10-CM

## 2021-03-25 DIAGNOSIS — Z1211 Encounter for screening for malignant neoplasm of colon: Secondary | ICD-10-CM

## 2021-03-25 DIAGNOSIS — M159 Polyosteoarthritis, unspecified: Secondary | ICD-10-CM

## 2021-03-25 DIAGNOSIS — IMO0002 Reserved for concepts with insufficient information to code with codable children: Secondary | ICD-10-CM

## 2021-03-25 DIAGNOSIS — Z0000 Encounter for general adult medical examination without abnormal findings: Secondary | ICD-10-CM

## 2021-03-25 DIAGNOSIS — R451 Restlessness and agitation: Secondary | ICD-10-CM | POA: Diagnosis not present

## 2021-03-25 DIAGNOSIS — E1169 Type 2 diabetes mellitus with other specified complication: Secondary | ICD-10-CM

## 2021-03-25 DIAGNOSIS — E1142 Type 2 diabetes mellitus with diabetic polyneuropathy: Secondary | ICD-10-CM

## 2021-03-25 DIAGNOSIS — G720 Drug-induced myopathy: Secondary | ICD-10-CM

## 2021-03-25 DIAGNOSIS — R42 Dizziness and giddiness: Secondary | ICD-10-CM

## 2021-03-25 DIAGNOSIS — E1140 Type 2 diabetes mellitus with diabetic neuropathy, unspecified: Secondary | ICD-10-CM

## 2021-03-25 DIAGNOSIS — M8949 Other hypertrophic osteoarthropathy, multiple sites: Secondary | ICD-10-CM

## 2021-03-25 DIAGNOSIS — E1165 Type 2 diabetes mellitus with hyperglycemia: Secondary | ICD-10-CM

## 2021-03-25 MED ORDER — LOSARTAN POTASSIUM 50 MG PO TABS
1.0000 | ORAL_TABLET | Freq: Every day | ORAL | 3 refills | Status: DC
Start: 2021-03-25 — End: 2022-05-25

## 2021-03-25 MED ORDER — METFORMIN HCL 1000 MG PO TABS: 1.0000 | ORAL_TABLET | Freq: Two times a day (BID) | ORAL | 3 refills | Status: DC

## 2021-03-25 MED ORDER — IMIPRAMINE HCL 10 MG PO TABS: 10.0000 mg | ORAL_TABLET | Freq: Every day | ORAL | 3 refills | Status: DC

## 2021-03-25 MED ORDER — INSULIN GLARGINE 100 UNIT/ML ~~LOC~~ SOLN: SUBCUTANEOUS | 11 refills | Status: DC

## 2021-03-25 MED ORDER — FLUOXETINE HCL 40 MG PO CAPS: 40.0000 mg | ORAL_CAPSULE | Freq: Every day | ORAL | 3 refills | Status: DC

## 2021-03-25 MED ORDER — AMLODIPINE BESYLATE 5 MG PO TABS: 1.0000 | ORAL_TABLET | Freq: Every day | ORAL | 3 refills | Status: DC

## 2021-03-25 MED ORDER — TRAMADOL HCL 50 MG PO TABS: 50.0000 mg | ORAL_TABLET | Freq: Three times a day (TID) | ORAL | 2 refills | Status: DC | PRN

## 2021-03-25 NOTE — Assessment & Plan Note (Signed)
Improved control on Tramadol / Diclofenac topical, limited other options Underlying etiology of chronic pain with OA/DJD multiple joints Affecting knees, feet, and other joints. Imaging with bone scan 2008 for cancer found reduced cartilage on joints No recent X-rays Improved on Tramadol in past - some nausea - refill Tramadol, checked PDMP Failed multiple NSAIDs  Follow-up future re-eval consider steroid inj knee/shoulders, future PT, ortho as need

## 2021-03-25 NOTE — Assessment & Plan Note (Signed)
Improved A1c to 8.3 on higher GLP1 trulicity No hypoglycemia Complications - peripheral neuropathy L>R Failed: Victoza (cost), Bydureon (local reaction, knot) - Tyler Aas (cost), Ozempic (ineffective), Contraindicated SGLT2 (bladder cancer)  Plan:  - Continue Trulicity 3.0 mg weekly - Continue Lantus 70u daily eventually goal to reduce dose - Continue Metformin 1000mg  BID 2. Encourage improved lifestyle - low carb, low sugar diet, reduce portion size, continue improving regular exercise 3. Check CBG , bring log to next visit for review - Continue Freestyle Libre 4. Continue ARB - consider ASA, Statin in future again or alternative PSK9 inhib - he has declined due to statin intolerance 5. Last updated DM [place] in [place] request copy

## 2021-03-25 NOTE — Assessment & Plan Note (Signed)
Failed multiple statins with myalgia and zetia

## 2021-03-25 NOTE — Assessment & Plan Note (Signed)
Mildly elevated LDL, not at goal The 10-year ASCVD risk score Mikey Bussing DC Jr., et al., 2013) is: 45.5% Failed multiple statins, failed zetia  Plan: 1. Declines statin therapy at this time, has failed multiple due to myalgia - offered PCSK9 2. Encourage improved lifestyle - low carb/cholesterol, reduce portion size, continue improving regular exercise

## 2021-03-25 NOTE — Assessment & Plan Note (Signed)
Elevated BP, improve on repeat still near elevated range SBP 140 - Home BP readings limited readings  No known complications    Plan:  1. Continue current BP regimen - Losartan 50mg  daily, Amodipine 5mg  daily 2. Encourage improved lifestyle - low sodium diet, improve regular exercise 3.  monitor BP outside office, bring readings to next visit, if persistently >140/90 or new symptoms notify office sooner

## 2021-03-25 NOTE — Patient Instructions (Addendum)
Thank you for coming to the office today.  Recent Labs    09/24/20 0942 03/18/21 0754  HGBA1C 9.8* 8.3*    Feeling [facility] Address: [address], [place], [place] [address] Phone: [phone]  Let me know if you want a referral to consult with the sleep specialist, maybe a sleep study or just a consultation.  Please schedule a Follow-up Appointment to: Return in about 6 months (around 09/24/2021) for 6 month follow-up DM A1c, HTN.  If you have any other questions or concerns, please feel free to call the office or send a message through [place]. You may also schedule an earlier appointment if necessary.  Additionally, you may be receiving a survey about your experience at our office within a few days to 1 week by e-mail or mail. We value your feedback.  [name], DO [place]

## 2021-03-25 NOTE — Progress Notes (Signed)
Subjective:    Patient ID: Henry Horne, male    DOB: 04/26/1951, 70 y.o.   MRN: [id number]  Henry Horne is a 70 y.o. male presenting on 03/25/2021 for Annual Exam   HPI   Here for Annual Physical and Lab Review.   CHRONIC DM, Type 2with history of DM Neuropathy Last visit for DM 08/2020 He is doing well but admits not adhering to lifestyle - Lab results A1c previously 10.3 then 8.9 now due today CBGs:reviewed - He has [employer] sensor and keeping track of it. Meds:Lantus70umorning,Trulicity 3mg  weekly [place] (increased from previous visit 1.5), Metformin 1000mg  BID Reports good compliance.Tolerating well w/o side-effects Currently on ARB Lifestyle: - Diet (Improving diet overall, limiting sugars/carbs - admits poor diet often, more sweets) - Exercise (limited regular exercise,knee chronic joint pain) -[facility] [place] - Prior history of nerve testing L>R reduced sensation Admits some numbness tingling feet Denies hypoglycemia  HYPERLIPIDEMIA/ Obesity BMI >35 - Reports no concerns. Last lipid 02/2021,mild elevated LDLbut stable overall - Not on statin therapy, failed multiple statins and zetia in past due to statin myalgia intolerance Not interested in PCSK9  CHRONIC HTN: Reportsno recent BP numbers at home. He has elevated reading. Current Meds -Losartan 50mg  daily, Amlodipine 5mg  daily Reports good compliance, took meds today. Tolerating well, w/o complaints. Denies CP, dyspnea, HA, edema, dizziness / lightheadedness  Chronic Pain /osteoarthritis multiple joints Doing well on Tramadol 50mg  daily PRN recently worse with back and shoulders Needs extra refills.  PMH Bladder cancer (Urothelial carcinoma) and Prostate Cancer - has completed BCG interferon, valrubicin therapy. No recurrence. Followed by Dr Jacqlyn Larsen, now in [facility], has traveled to see same provider. He will do yearly cystoscopy. Additional  complaint On imipramine  Centrilobular Emphysema / Coughing / Night-time triggered Reports when lay down at night, has a clear phlegm cough Used to take Advair, no longer on. Has albuterol PRN uses occasionally it does help, but not always using. Denies significant dyspnea during day  Irritable / Agitation On Fluoxetine 40mg  daily, will need refill. Doing well on medicine Not having significant depression.  Health Maintenance:  Colon CA Screening: Never completed Cologuard 07/2019 appears to be erroneously entered on chart. He will need to redo cologuard.  Depression screen [facility] 2/9 03/25/2021 09/24/2020 01/29/2020  Decreased Interest 0 0 1  Down, Depressed, Hopeless 2 0 1  PHQ - 2 Score 2 0 2  Altered sleeping 1 - 2  Tired, decreased energy 2 - 2  Change in appetite 0 - 1  Feeling bad or failure about yourself  1 - 1  Trouble concentrating 0 - 1  Moving slowly or fidgety/restless 0 - 1  Suicidal thoughts 0 - 1  PHQ-9 Score 6 - 11  Difficult doing work/chores Not difficult at all - Somewhat difficult  Some recent data might be hidden    Past Medical History:  Diagnosis Date  . Anxiety   . Cancer of kidney ([place]) 2010   cystoscopy 2010, 2014, 2015, 2016- MD q 6 months  . Depression   . Emphysema of lung ([place]) 1996  . Hyperlipidemia   . Hypertension   . Joint pain   . Prostate cancer [facility]) 2008   removed with cryotherapy   Past Surgical History:  Procedure Laterality Date  . DIGIT NAIL REMOVAL Left 2016   left and right foot -toe nails   Social History   Socioeconomic History  . Marital status: Married  Spouse name: Butch Penny  . Number of children: Not on file  . Years of education: Not on file  . Highest education level: Not on file  Occupational History  . Not on file  Tobacco Use  . Smoking status: Former Smoker    Years: 30.00    Quit date: 05/18/2004    Years since quitting: 16.8  . Smokeless tobacco: Former Network engineer  . Vaping Use: Never used   Substance and Sexual Activity  . Alcohol use: No    Alcohol/week: 0.0 standard drinks  . Drug use: No  . Sexual activity: Not on file  Other Topics Concern  . Not on file  Social History Narrative  . Not on file   Social Determinants of Health   Financial Resource Strain: Not on file  Food Insecurity: Not on file  Transportation Needs: Not on file  Physical Activity: Not on file  Stress: Not on file  Social Connections: Not on file  Intimate Partner Violence: Not on file   Family History  Problem Relation Age of Onset  . Diabetes Mother   . Heart disease Father   . Cancer Father   . Hyperlipidemia Father   . Hypertension Father   . Diabetes Brother   . COPD Brother   . Autoimmune disease Brother   . Heart disease Brother   . Diabetes Brother    Current Outpatient Medications on File Prior to Visit  Medication Sig  . albuterol (PROAIR HFA) 108 (90 Base) MCG/ACT inhaler Inhale 1-2 puffs into the lungs every 4 (four) hours as needed for wheezing or shortness of breath.  . Continuous Blood Gluc Receiver (FREESTYLE LIBRE 14 DAY READER) DEVI 1 each by Does not apply route every 14 (fourteen) days. Use to check blood sugar with Freestyle libre  . Continuous Blood Gluc Sensor (FREESTYLE LIBRE 14 DAY SENSOR) MISC USE TO CHECK BLOOD SUGAR AS NEEDED EVERY 14 DAYS.  Marland Kitchen diclofenac sodium (VOLTAREN) 1 % GEL Apply 2 g topically 3 (three) times daily as needed. For knees  . fluticasone (FLONASE) 50 MCG/ACT nasal spray Place 2 sprays into both nostrils daily. Use for 4-6 weeks then stop and use seasonally or as needed.  . Fluticasone-Salmeterol (ADVAIR DISKUS) 250-50 MCG/DOSE AEPB Inhale 1 puff into the lungs 2 (two) times daily.  . Garlic Oil 2 MG CAPS Take 1,000 mg by mouth 2 (two) times daily. Reported on 11/17/2015  . glucose blood (TRUETEST TEST) test strip 1 each by Other route 2 (two) times daily. Use as instructed  . Insulin Pen Needle (NOVOFINE) 30G X 8 MM MISC Use 1 nweedle to  inject Victoza once daily.  . Insulin Pen Needle 31G X 5 MM MISC 1 each by Does not apply route 3 (three) times daily.  . Insulin Pen Needle 31G X 8 MM MISC Use as directed  . Insulin Syringe-Needle U-100 31G X 5/16" 1 ML MISC 100 each by Does not apply route 2 (two) times daily.  Marland Kitchen omeprazole (PRILOSEC) 20 MG capsule Take 20 mg by mouth daily.  . TRUEPLUS LANCETS 30G MISC Use 1 lancet  to check Blood sugar twice daily  . TRULICITY 3 0000000 SOPN Inject 3 mg as directed once a week.   No current facility-administered medications on file prior to visit.    Review of Systems  Constitutional: Negative for activity change, appetite change, chills, diaphoresis, fatigue and fever.  HENT: Negative for congestion and hearing loss.   Eyes: Negative for visual disturbance.  Respiratory: Negative for cough, chest tightness, shortness of breath and wheezing.   Cardiovascular: Negative for chest pain, palpitations and leg swelling.  Gastrointestinal: Negative for abdominal pain, constipation, diarrhea, nausea and vomiting.  Endocrine: Negative for cold intolerance.  Genitourinary: Negative for dysuria, frequency and hematuria.  Musculoskeletal: Negative for arthralgias and neck pain.  Skin: Negative for rash.  Allergic/Immunologic: Negative for environmental allergies.  Neurological: Negative for dizziness, weakness, light-headedness, numbness and headaches.  Hematological: Negative for adenopathy.  Psychiatric/Behavioral: Negative for behavioral problems, dysphoric mood and sleep disturbance.   Per HPI unless specifically indicated above     Objective:    BP 140/70 (BP Location: Left Arm, Cuff Size: Normal)   Pulse 75   Ht 5' 10.5" (1.791 m)   Wt 237 lb (107.5 kg)   SpO2 100%   BMI 33.53 kg/m   Wt Readings from Last 3 Encounters:  03/25/21 237 lb (107.5 kg)  02/03/21 231 lb 6.4 oz (105 kg)  09/24/20 243 lb 9.6 oz (110.5 kg)    Physical Exam Vitals and nursing note reviewed.   Constitutional:      General: He is not in acute distress.    Appearance: He is well-developed. He is not diaphoretic.     Comments: Well-appearing, comfortable, cooperative  HENT:     Head: Normocephalic and atraumatic.  Eyes:     General:        Right eye: No discharge.        Left eye: No discharge.     Conjunctiva/sclera: Conjunctivae normal.     Pupils: Pupils are equal, round, and reactive to light.  Neck:     Thyroid: No thyromegaly.     Vascular: No carotid bruit.  Cardiovascular:     Rate and Rhythm: Normal rate and regular rhythm.     Heart sounds: Normal heart sounds. No murmur heard.   Pulmonary:     Effort: Pulmonary effort is normal. No respiratory distress.     Breath sounds: Normal breath sounds. No wheezing or rales.  Abdominal:     General: Bowel sounds are normal. There is no distension.     Palpations: Abdomen is soft. There is no mass.     Tenderness: There is no abdominal tenderness.  Musculoskeletal:        General: No tenderness. Normal range of motion.     Cervical back: Normal range of motion and neck supple.     Right lower leg: No edema.     Left lower leg: No edema.     Comments: Upper / Lower Extremities: - Normal muscle tone, strength bilateral upper extremities 5/5, lower extremities 5/5  Lymphadenopathy:     Cervical: No cervical adenopathy.  Skin:    General: Skin is warm and dry.     Findings: No erythema or rash.  Neurological:     Mental Status: He is alert and oriented to person, place, and time.     Comments: Distal sensation intact to light touch all extremities  Psychiatric:        Behavior: Behavior normal.     Comments: Well groomed, good eye contact, normal speech and thoughts    Results for orders placed or performed in visit on 03/18/21  Lipid panel  Result Value Ref Range   Cholesterol 187 <200 mg/dL   HDL 35 (L) > OR = 40 mg/dL   Triglycerides 108 <150 mg/dL   LDL Cholesterol (Calc) 130 (H) mg/dL (calc)   Total  CHOL/HDL Ratio 5.3 (H) <  5.0 (calc)   Non-HDL Cholesterol (Calc) 152 (H) <130 mg/dL (calc)  COMPLETE METABOLIC PANEL WITH GFR  Result Value Ref Range   Glucose, Bld 139 (H) 65 - 99 mg/dL   BUN 14 7 - 25 mg/dL   Creat 0.81 0.70 - 1.18 mg/dL   GFR, Est Non African American 90 > OR = 60 mL/min/1.76m2   GFR, Est African American 104 > OR = 60 mL/min/1.52m2   BUN/Creatinine Ratio NOT APPLICABLE 6 - 22 (calc)   Sodium 139 135 - 146 mmol/L   Potassium 4.2 3.5 - 5.3 mmol/L   Chloride 102 98 - 110 mmol/L   CO2 30 20 - 32 mmol/L   Calcium 8.6 8.6 - 10.3 mg/dL   Total Protein 6.1 6.1 - 8.1 g/dL   Albumin 3.9 3.6 - 5.1 g/dL   Globulin 2.2 1.9 - 3.7 g/dL (calc)   AG Ratio 1.8 1.0 - 2.5 (calc)   Total Bilirubin 0.6 0.2 - 1.2 mg/dL   Alkaline phosphatase (APISO) 65 35 - 144 U/L   AST 16 10 - 35 U/L   ALT 22 9 - 46 U/L  CBC with Differential/Platelet  Result Value Ref Range   WBC 8.4 3.8 - 10.8 Thousand/uL   RBC 5.56 4.20 - 5.80 Million/uL   Hemoglobin 14.5 13.2 - 17.1 g/dL   HCT 46.2 38.5 - 50.0 %   MCV 83.1 80.0 - 100.0 fL   MCH 26.1 (L) 27.0 - 33.0 pg   MCHC 31.4 (L) 32.0 - 36.0 g/dL   RDW 13.2 11.0 - 15.0 %   Platelets 252 140 - 400 Thousand/uL   MPV 10.3 7.5 - 12.5 fL   Neutro Abs 5,830 1,500 - 7,800 cells/uL   Lymphs Abs 1,428 850 - 3,900 cells/uL   Absolute Monocytes 781 200 - 950 cells/uL   Eosinophils Absolute 302 15 - 500 cells/uL   Basophils Absolute 59 0 - 200 cells/uL   Neutrophils Relative % 69.4 %   Total Lymphocyte 17.0 %   Monocytes Relative 9.3 %   Eosinophils Relative 3.6 %   Basophils Relative 0.7 %  Hemoglobin A1c  Result Value Ref Range   Hgb A1c MFr Bld 8.3 (H) <5.7 % of total Hgb   Mean Plasma Glucose 192 mg/dL   eAG (mmol/L) 10.6 mmol/L      Assessment & Plan:   Problem List Items Addressed This Visit    Uncontrolled type 2 diabetes mellitus with peripheral neuropathy (HCC)    Improved A1c to 8.3 on higher GLP1 trulicity No hypoglycemia Complications  - peripheral neuropathy L>R Failed: Victoza (cost), Bydureon (local reaction, knot) - Tyler Aas (cost), Ozempic (ineffective), Contraindicated SGLT2 (bladder cancer)  Plan:  - Continue Trulicity 3.0 mg weekly - Continue Lantus 70u daily eventually goal to reduce dose - Continue Metformin 1000mg  BID 2. Encourage improved lifestyle - low carb, low sugar diet, reduce portion size, continue improving regular exercise 3. Check CBG , bring log to next visit for review - Continue Freestyle Libre 4. Continue ARB - consider ASA, Statin in future again or alternative PSK9 inhib - he has declined due to statin intolerance 5. [facility] [place] [facility]      Relevant Medications   FLUoxetine (PROZAC) 40 MG capsule   imipramine (TOFRANIL) 10 MG tablet   insulin glargine (LANTUS) 100 UNIT/ML injection   losartan (COZAAR) 50 MG tablet   metFORMIN (GLUCOPHAGE) 1000 MG tablet   Osteoarthritis of multiple joints    Improved control  on Tramadol / Diclofenac topical, limited other options Underlying etiology of chronic pain with OA/DJD multiple joints Affecting knees, feet, and other joints. Imaging with bone scan 2008 for cancer found reduced cartilage on joints No recent X-rays Improved on Tramadol in past - some nausea - refill Tramadol, checked PDMP Failed multiple NSAIDs  Follow-up future re-eval consider steroid inj knee/shoulders, future PT, ortho as need      Relevant Medications   traMADol (ULTRAM) 50 MG tablet   Hyperlipidemia associated with type 2 diabetes mellitus ([place])    Mildly elevated LDL, not at goal The 10-year ASCVD risk score Mikey Bussing DC Jr., et al., 2013) is: 45.5% Failed multiple statins, failed zetia  Plan: 1. Declines statin therapy at this time, has failed multiple due to myalgia - offered PCSK9 2. Encourage improved lifestyle - low carb/cholesterol, reduce portion size, continue improving regular exercise      Relevant Medications   insulin  glargine (LANTUS) 100 UNIT/ML injection   losartan (COZAAR) 50 MG tablet   metFORMIN (GLUCOPHAGE) 1000 MG tablet   Essential hypertension    Elevated BP, improve on repeat still near elevated range SBP 140 - Home BP readings limited readings  No known complications    Plan:  1. Continue current BP regimen - Losartan 50mg  daily, Amodipine 5mg  daily 2. Encourage improved lifestyle - low sodium diet, improve regular exercise 3.  monitor BP outside office, bring readings to next visit, if persistently >140/90 or new symptoms notify office sooner      Relevant Medications   amLODipine (NORVASC) 5 MG tablet   losartan (COZAAR) 50 MG tablet   Drug-induced myopathy    Failed multiple statins with myalgia and zetia      Agitation   Relevant Medications   FLUoxetine (PROZAC) 40 MG capsule    Other Visit Diagnoses    Annual physical exam    -  Primary   Dizziness       Relevant Medications   imipramine (TOFRANIL) 10 MG tablet   Uncontrolled type 2 diabetes with neuropathy (HCC)       Relevant Medications   insulin glargine (LANTUS) 100 UNIT/ML injection   losartan (COZAAR) 50 MG tablet   metFORMIN (GLUCOPHAGE) 1000 MG tablet   Colon cancer screening       Relevant Orders   Cologuard      Updated Health Maintenance information Ordered Cologuard. Previously incorrectly entered as negative back in 07/2019, marked result on chart.  Reviewed recent lab results with patient Encouraged improvement to lifestyle with diet and exercise Goal of weight loss   Meds ordered this encounter  Medications  . amLODipine (NORVASC) 5 MG tablet    Sig: Take 1 tablet (5 mg total) by mouth daily.    Dispense:  90 tablet    Refill:  3  . FLUoxetine (PROZAC) 40 MG capsule    Sig: Take 1 capsule (40 mg total) by mouth daily.    Dispense:  90 capsule    Refill:  3  . imipramine (TOFRANIL) 10 MG tablet    Sig: Take 1 tablet (10 mg total) by mouth at bedtime.    Dispense:  90 tablet    Refill:  3   . insulin glargine (LANTUS) 100 UNIT/ML injection    Sig: INJECT 70 UNITS SUBCUTANEOUSLY ONCE DAILY    Dispense:  20 mL    Refill:  11  . losartan (COZAAR) 50 MG tablet    Sig: Take 1 tablet (50 mg total) by mouth  daily.    Dispense:  90 tablet    Refill:  3  . metFORMIN (GLUCOPHAGE) 1000 MG tablet    Sig: Take 1 tablet (1,000 mg total) by mouth 2 (two) times daily with a meal.    Dispense:  180 tablet    Refill:  3  . traMADol (ULTRAM) 50 MG tablet    Sig: Take 1 tablet (50 mg total) by mouth every 8 (eight) hours as needed.    Dispense:  30 tablet    Refill:  2    Add refills      Follow up plan: Return in about 6 months (around 09/24/2021) for 6 month follow-up DM A1c, HTN.  [name], [facility] 03/25/2021, 8:33 AM

## 2021-04-07 LAB — COLOGUARD: Cologuard: NEGATIVE

## 2021-04-16 LAB — COLOGUARD: COLOGUARD: NEGATIVE

## 2021-05-30 ENCOUNTER — Other Ambulatory Visit: Payer: Self-pay | Admitting: Family Medicine

## 2021-05-30 DIAGNOSIS — R42 Dizziness and giddiness: Secondary | ICD-10-CM

## 2021-05-30 NOTE — Telephone Encounter (Signed)
last RF 03/05/21 #90 3 RF///requested too soon

## 2021-07-16 ENCOUNTER — Other Ambulatory Visit: Payer: Self-pay | Admitting: Family Medicine

## 2021-07-16 DIAGNOSIS — J4520 Mild intermittent asthma, uncomplicated: Secondary | ICD-10-CM

## 2021-07-16 NOTE — Telephone Encounter (Signed)
   Notes to clinic:  Requested script is expired  Review for continued use and refill    Requested Prescriptions  Pending Prescriptions Disp Refills   PROAIR HFA 108 (90 Base) MCG/ACT inhaler [Pharmacy Med Name: ProAir HFA 108 (90 Base) MCG/ACT Inhalation Aerosol Solution] 9 g 0    Sig: INHALE 1 TO 2 PUFFS BY MOUTH EVERY 4 HOURS AS NEEDED FOR WHEEZING OR SHORTNESS OF BREATH     Pulmonology:  Beta Agonists Failed - 07/16/2021 12:15 PM      Failed - One inhaler should last at least one month. If the patient is requesting refills earlier, contact the patient to check for uncontrolled symptoms.      Passed - Valid encounter within last 12 months    Recent Outpatient Visits           3 months ago Annual physical exam   [place], DO   5 months ago Epidermoid cyst of skin of back   [place], DO   9 months ago Uncontrolled type 2 diabetes mellitus with peripheral neuropathy [facility])   [place], DO   1 year ago Annual physical exam   [place], DO   1 year ago Uncontrolled type 2 diabetes mellitus with peripheral neuropathy [facility])   [facility], Devonne Doughty, DO

## 2021-09-05 ENCOUNTER — Other Ambulatory Visit: Payer: Self-pay | Admitting: Family Medicine

## 2021-09-05 NOTE — Telephone Encounter (Signed)
Requested medication (s) are due for refill today: yes  Requested medication (s) are on the active medication list: yes  Last refill:  01/06/21 2 ml 5 RF  Future visit scheduled: no  Notes to clinic:  overdue repeat Hgb A1C   Requested Prescriptions  Pending Prescriptions Disp Refills   TRULICITY 3 VB/1.6OM SOPN [Pharmacy Med Name: Trulicity 3 AY/0.4HT Subcutaneous Solution Pen-injector] 4 mL 0    Sig: INJECT 3MG  (0.5ML) SUBCUTANEOUSLY ONCE A WEEK     Endocrinology:  Diabetes - GLP-1 Receptor Agonists Failed - 09/05/2021  3:59 PM      Failed - HBA1C is between 0 and 7.9 and within 180 days    Hgb A1c MFr Bld  Date Value Ref Range Status  03/18/2021 8.3 (H) <5.7 % of total Hgb Final    Comment:    For someone without known diabetes, a hemoglobin A1c value of 6.5% or greater indicates that they may have  diabetes and this should be confirmed with a follow-up  test. . For someone with known diabetes, a value <7% indicates  that their diabetes is well controlled and a value  greater than or equal to 7% indicates suboptimal  control. A1c targets should be individualized based on  duration of diabetes, age, comorbid conditions, and  other considerations. . Currently, no consensus exists regarding use of hemoglobin A1c for diagnosis of diabetes for children. Renella Cunas - Valid encounter within last 6 months    Recent Outpatient Visits           5 months ago Annual physical exam   [place], DO   7 months ago Epidermoid cyst of skin of back   [place], DO   11 months ago Uncontrolled type 2 diabetes mellitus with peripheral neuropathy [facility])   [place], DO   1 year ago Annual physical exam   [place], DO   2 years ago Uncontrolled type 2 diabetes mellitus with peripheral neuropathy [facility])    [facility], Devonne Doughty, DO

## 2021-09-30 ENCOUNTER — Ambulatory Visit: Payer: Self-pay | Admitting: *Deleted

## 2021-09-30 LAB — HM DIABETES EYE EXAM

## 2021-09-30 NOTE — Telephone Encounter (Signed)
Pt is calling because he has had cold sx 1 week with cough, congestion, sinus pressure. Office has no appts until next Wednesday. Pt would like to know what he can take? Please advise  Reason for Disposition  SEVERE coughing spells (e.g., whooping sound after coughing, vomiting after coughing)  Answer Assessment - Initial Assessment Questions 1. ONSET: "When did the cough begin?"      1 week ago 2. SEVERITY: "How bad is the cough today?"      Patient wakes coughing, spasms 3. SPUTUM: "Describe the color of your sputum" (none, dry cough; clear, white, yellow, green)     Gray- nasty 4. HEMOPTYSIS: "Are you coughing up any blood?" If so ask: "How much?" (flecks, streaks, tablespoons, etc.)     no 5. DIFFICULTY BREATHING: "Are you having difficulty breathing?" If Yes, ask: "How bad is it?" (e.g., mild, moderate, severe)    - MILD: No SOB at rest, mild SOB with walking, speaks normally in sentences, can lie down, no retractions, pulse < 100.    - MODERATE: SOB at rest, SOB with minimal exertion and prefers to sit, cannot lie down flat, speaks in phrases, mild retractions, audible wheezing, pulse 100-120.    - SEVERE: Very SOB at rest, speaks in single words, struggling to breathe, sitting hunched forward, retractions, pulse > 120      wheezing 6. FEVER: "Do you have a fever?" If Yes, ask: "What is your temperature, how was it measured, and when did it start?"     No-sweats at night 7. CARDIAC HISTORY: "Do you have any history of heart disease?" (e.g., heart attack, congestive heart failure)      no 8. LUNG HISTORY: "Do you have any history of lung disease?"  (e.g., pulmonary embolus, asthma, emphysema)     no 9. PE RISK FACTORS: "Do you have a history of blood clots?" (or: recent major surgery, recent prolonged travel, bedridden)     no 10. OTHER SYMPTOMS: "Do you have any other symptoms?" (e.g., runny nose, wheezing, chest pain)       Nasal congestion, sore throat, sinus pressure 11.  PREGNANCY: "Is there any chance you are pregnant?" "When was your last menstrual period?"       na 12. TRAVEL: "Have you traveled out of the country in the last month?" (e.g., travel history, exposures)       na  Protocols used: Cough - Acute Productive-A-AH

## 2021-09-30 NOTE — Telephone Encounter (Signed)
Patient is calling to report cough, nasal/chest congestion, sinus pressure for 1 week- negative COVID testing. Patient advised no open appointment- patient agreeable to go to UC for evaluation and treatment. Advised will alert provider.

## 2021-10-01 ENCOUNTER — Ambulatory Visit
Admission: EM | Admit: 2021-10-01 | Discharge: 2021-10-01 | Disposition: A | Discharge status: Home / Self Care | Payer: BC Managed Care – PPO | Attending: Emergency Medicine | Admitting: Emergency Medicine

## 2021-10-01 ENCOUNTER — Encounter: Payer: Self-pay | Admitting: Emergency Medicine

## 2021-10-01 ENCOUNTER — Other Ambulatory Visit: Payer: Self-pay

## 2021-10-01 ENCOUNTER — Ambulatory Visit (INDEPENDENT_AMBULATORY_CARE_PROVIDER_SITE_OTHER): Payer: BC Managed Care – PPO

## 2021-10-01 DIAGNOSIS — R062 Wheezing: Secondary | ICD-10-CM | POA: Diagnosis not present

## 2021-10-01 DIAGNOSIS — R059 Cough, unspecified: Secondary | ICD-10-CM | POA: Diagnosis not present

## 2021-10-01 DIAGNOSIS — J441 Chronic obstructive pulmonary disease with (acute) exacerbation: Secondary | ICD-10-CM | POA: Diagnosis not present

## 2021-10-01 DIAGNOSIS — R0602 Shortness of breath: Secondary | ICD-10-CM | POA: Diagnosis not present

## 2021-10-01 DIAGNOSIS — J432 Centrilobular emphysema: Secondary | ICD-10-CM

## 2021-10-01 MED ORDER — FLUTICASONE-SALMETEROL 250-50 MCG/DOSE IN AEPB: 1.0000 | INHALATION_SPRAY | Freq: Two times a day (BID) | RESPIRATORY_TRACT | 5 refills | Status: DC

## 2021-10-01 MED ORDER — PREDNISONE 20 MG PO TABS: 60.0000 mg | ORAL_TABLET | Freq: Every day | ORAL | 0 refills | Status: AC

## 2021-10-01 MED ORDER — CEFDINIR 300 MG PO CAPS: 300.0000 mg | ORAL_CAPSULE | Freq: Two times a day (BID) | ORAL | 0 refills | Status: AC

## 2021-10-01 NOTE — Discharge Instructions (Addendum)
Your chest x-ray did not demonstrate any signs of infection per the radiologist report.  I will treat you for a COPD exacerbation.  I want you to continue your inhalers as previously prescribed taking your Advair twice daily and using your albuterol inhaler every 4-6 hours as needed for shortness of breath and wheezing.  Start the prednisone this morning and you will take 60 mg each day for period of 5 days.  The prednisone is to decrease inflammation in your lungs and make an easier for you to breathe.  Start the cefdinir today and take it twice daily with food for 10 days for treatment of your COPD exacerbation.  If you have any worsening shortness of breath, wheezing, increased production of sputum, or fever you need to return for reevaluation or go to the emergency department.

## 2021-10-01 NOTE — ED Provider Notes (Signed)
[facility]    CSN: [id number] Arrival date & time: 10/01/21  0802      History   Chief Complaint Chief Complaint  Patient presents with   Cough    HPI Henry Horne is a 70 y.o. male.   HPI  70 year old male here for evaluation of respiratory complaints.  Patient reports that for the last 6 days he has been experiencing a productive cough with sputum that varies between gray, green, and bloody.  This is associated with increased shortness of breath and wheezing that is not responding to his albuterol inhaler.  He also takes Advair twice daily.  He endorses a runny nose with nasal congestion, ear fullness, and states that at the onset of symptoms had a sore throat but that has pretty much resolved.  He denies fever, chills, body aches, GI complaints.  He has had sweats off and on.  Patient has a history of asthma, COPD, and empyema in the past.  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   Obesity (BMI 30.0-34.9) 01/17/2018   Osteoarthritis of multiple joints 10/04/2017   Asthma 12/30/2016   ACE-inhibitor cough 12/30/2016   Agitation 06/16/2016   Personal history of bladder cancer 05/12/2016   Uncontrolled type 2 diabetes mellitus with peripheral neuropathy 12/25/2015   Hyperlipidemia associated with type 2 diabetes mellitus ([place]) 11/02/2015   Asymptomatic bacteriuria 10/08/2015   Essential hypertension 07/29/2015   Urinary obstruction, not elsewhere classified 06/19/2013   Personal history of prostate cancer 12/19/2012   Family history of malignant neoplasm of prostate 12/12/2012   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 Authorizing Provider  amLODipine (NORVASC) 5 MG tablet Take 1 tablet (5 mg total) by mouth daily. 03/25/21  Yes Karamalegos, Devonne Doughty, DO  cefdinir (OMNICEF) 300 MG capsule Take 1 capsule (300 mg total) by mouth 2 (two) times daily for 10 days. 10/01/21 10/11/21 Yes Margarette Canada, NP  FLUoxetine (PROZAC) 40 MG capsule Take 1 capsule (40 mg total) by mouth daily. 03/25/21  Yes Karamalegos, Devonne Doughty, DO  fluticasone (FLONASE) 50 MCG/ACT nasal spray Place 2 sprays into both nostrils daily. Use for 4-6 weeks then stop and use seasonally or as needed. 02/03/21  Yes Karamalegos, Devonne Doughty, DO  Fluticasone-Salmeterol (ADVAIR DISKUS) 250-50 MCG/DOSE AEPB Inhale 1 puff into the lungs 2 (two) times daily. 01/29/20  Yes Karamalegos, Devonne Doughty, DO  imipramine (TOFRANIL) 10 MG tablet Take 1 tablet (10 mg total) by mouth at bedtime. 03/25/21  Yes Karamalegos, Devonne Doughty, DO  insulin glargine (LANTUS) 100 UNIT/ML injection INJECT 70 UNITS SUBCUTANEOUSLY  ONCE DAILY 03/25/21  Yes Karamalegos, Devonne Doughty, DO  losartan (COZAAR) 50 MG tablet Take 1 tablet (50 mg total) by mouth daily. 03/25/21  Yes Karamalegos, Devonne Doughty, DO  metFORMIN (GLUCOPHAGE) 1000 MG tablet Take 1 tablet (1,000 mg total) by mouth 2 (two) times daily with a meal. 03/25/21  Yes Karamalegos, Devonne Doughty, DO  omeprazole (PRILOSEC) 20 MG capsule Take 20 mg by mouth daily.   Yes [provider]  predniSONE (DELTASONE) 20 MG tablet Take 3 tablets (60 mg total) by mouth  daily with breakfast for 5 days. 3 tablets daily for 5 days. 10/01/21 10/06/21 Yes Margarette Canada, NP  PROAIR HFA 108 [phone] Base) MCG/ACT inhaler INHALE 1 TO 2 PUFFS BY MOUTH EVERY 4 HOURS AS NEEDED FOR WHEEZING OR SHORTNESS OF BREATH 07/16/21  Yes Karamalegos, Devonne Doughty, DO  traMADol (ULTRAM) 50 MG tablet Take 1 tablet (50 mg total) by mouth every 8 (eight) hours as needed. 03/25/21  Yes Karamalegos, Devonne Doughty, DO  TRULICITY 3 HT/3.4KA SOPN INJECT 3MG  (0.5ML) SUBCUTANEOUSLY ONCE A WEEK 09/06/21  Yes Karamalegos, Devonne Doughty, DO  Continuous Blood Gluc Receiver (FREESTYLE LIBRE 14 DAY READER) DEVI 1 each by Does not apply route every 14 (fourteen) days. Use to check blood sugar with Freestyle libre 07/25/18   Karamalegos, Devonne Doughty, DO  Continuous Blood Gluc Sensor (FREESTYLE LIBRE 14 DAY SENSOR) MISC USE TO CHECK BLOOD SUGAR AS NEEDED EVERY 14 DAYS. 10/13/19   Karamalegos, Devonne Doughty, DO  diclofenac sodium (VOLTAREN) 1 % GEL Apply 2 g topically 3 (three) times daily as needed. For knees 04/18/18   Karamalegos, Alexander J, DO  Garlic Oil 2 MG CAPS Take 1,000 mg by mouth 2 (two) times daily. Reported on 11/17/2015 12/12/12   [provider]  glucose blood (TRUETEST TEST) test strip 1 each by Other route 2 (two) times daily. Use as instructed 10/03/16   Arlis Porta., MD  Insulin Pen Needle (NOVOFINE) 30G X 8 MM MISC Use 1 nweedle to inject Victoza once daily. 10/03/16   Arlis Porta., MD  Insulin Pen Needle 31G X 5 MM MISC 1 each by Does not apply route 3 (three) times daily. 10/03/16   Arlis Porta., MD  Insulin Pen Needle 31G X 8 MM MISC Use as directed 11/29/17   Olin Hauser, DO  Insulin Syringe-Needle U-100 31G X 5/16" 1 ML MISC 100 each by Does not apply route 2 (two) times daily. 12/06/17   Parks Ranger, Devonne Doughty, DO  TRUEPLUS LANCETS 30G MISC Use 1 lancet  to check Blood sugar twice daily 10/03/16   Arlis Porta., MD    Family History Family History   Problem Relation Age of Onset   Diabetes Mother    Heart disease Father    Cancer Father    Hyperlipidemia Father    Hypertension Father    Diabetes Brother    COPD Brother    Autoimmune disease Brother    Heart disease Brother    Diabetes Brother     Social History Social History   Tobacco Use   Smoking status: Former    Years: 30.00    Types: Cigarettes    Quit date: 05/18/2004    Years since quitting: 17.3   Smokeless tobacco: Former  Scientific laboratory technician Use: Never used  Substance Use Topics   Alcohol use: No    Alcohol/week: 0.0 standard drinks   Drug use: No  Allergies   Zetia [ezetimibe], Ace inhibitors, Pneumococcal vaccine, and Pneumococcal vaccines   Review of Systems Review of Systems  Constitutional:  Positive for diaphoresis. Negative for chills and fever.  HENT:  Positive for congestion, ear pain, rhinorrhea and sore throat.   Respiratory:  Positive for cough, shortness of breath and wheezing.   Gastrointestinal:  Negative for diarrhea, nausea and vomiting.  Musculoskeletal:  Negative for arthralgias and myalgias.  Skin:  Negative for rash.  Hematological: Negative.   Psychiatric/Behavioral: Negative.      Physical Exam Triage Vital Signs ED Triage Vitals  Enc Vitals Group     BP 10/01/21 0814 (!) 160/91     Pulse Rate 10/01/21 0814 91     Resp 10/01/21 0814 16     Temp 10/01/21 0814 98.4 F (36.9 C)     Temp Source 10/01/21 0814 Oral     SpO2 10/01/21 0814 97 %     Weight 10/01/21 0811 236 lb 15.9 oz (107.5 kg)     Height 10/01/21 0811 5' 10.5" (1.791 m)     Head Circumference --      Peak Flow --      Pain Score 10/01/21 0810 0     Pain Loc --      Pain Edu? --      Excl. in [place]? --    No data found.  Updated Vital Signs BP (!) 160/91 (BP Location: Left Arm)   Pulse 91   Temp 98.4 F (36.9 C) (Oral)   Resp 16   Ht 5' 10.5" (1.791 m)   Wt 236 lb 15.9 oz (107.5 kg)   SpO2 97%   BMI 33.52 kg/m   Visual Acuity Right Eye  Distance:   Left Eye Distance:   Bilateral Distance:    Right Eye Near:   Left Eye Near:    Bilateral Near:     Physical Exam Vitals and nursing note reviewed.  Constitutional:      Appearance: Normal appearance.  HENT:     Head: Normocephalic and atraumatic.     Right Ear: Tympanic membrane, ear canal and external ear normal. There is no impacted cerumen.     Left Ear: Tympanic membrane, ear canal and external ear normal. There is no impacted cerumen.     Nose: Congestion and rhinorrhea present.     Mouth/Throat:     Mouth: Mucous membranes are moist.     Pharynx: Oropharynx is clear. No posterior oropharyngeal erythema.  Cardiovascular:     Rate and Rhythm: Normal rate and regular rhythm.     Pulses: Normal pulses.     Heart sounds: Normal heart sounds. No murmur heard.   No gallop.  Pulmonary:     Effort: Pulmonary effort is normal.     Breath sounds: No wheezing, rhonchi or rales.     Comments: Decreased breath sounds diffusely. Musculoskeletal:     Cervical back: Normal range of motion and neck supple.  Lymphadenopathy:     Cervical: No cervical adenopathy.  Skin:    General: Skin is warm and dry.     Capillary Refill: Capillary refill takes less than 2 seconds.     Findings: No erythema or rash.  Neurological:     General: No focal deficit present.     Mental Status: He is alert and oriented to person, place, and time.  Psychiatric:        Mood and Affect: Mood normal.        Behavior:  Behavior normal.        Thought Content: Thought content normal.        Judgment: Judgment normal.     UC Treatments / Results  Labs (all labs ordered are listed, but only abnormal results are displayed) Labs Reviewed - No data to display  EKG   Radiology DG Chest 2 View  Result Date: 10/01/2021 CLINICAL DATA:  Cough, shortness of breath and wheezing. EXAM: CHEST - 2 VIEW COMPARISON:  None. FINDINGS: Cardiac silhouette is normal in size. Normal mediastinal and hilar  contours. Clear lungs. No pleural effusion or pneumothorax. Skeletal structures are intact. IMPRESSION: No active cardiopulmonary disease. Electronically Signed   By: Lajean Manes M.D.   On: 10/01/2021 08:47    Procedures Procedures (including critical care time)  Medications Ordered in UC Medications - No data to display  Initial Impression / Assessment and Plan / UC Course  I have reviewed the triage vital signs and the nursing notes.  Pertinent labs & imaging results that were available during my care of the patient were reviewed by me and considered in my medical decision making (see chart for details).  Patient is a pleasant, nontoxic-appearing 70 year old male here for evaluation of a productive cough with associated shortness of breath and wheezing that has been going on for at least 6 days per his report.  His sputum varies in color and is sometimes bloody he reports.  He is using Advair twice daily and also albuterol rescue inhaler but is not helping his symptoms.  Patient is concerned because he has a history of COPD, asthma, and has had an empyema in the past.  He does have some associated upper respiratory symptoms but those are pretty mild.  On physical exam he has  Tympanic membrane's bilaterally with normal light reflex and clear external auditory canals.  Nasal mucosa is pink and moist with scant clear nasal discharge in both nares.  Oropharyngeal exam is benign.  Cardiopulmonary exam reveals diminished lung sounds in all lung fields.  I do not feel that it is necessary to swab the patient for COVID or influenza.  He took an at home COVID test and was negative and with his duration of symptoms he is outside of the infectious.  Or therapeutic window for antiviral therapy.  Will obtain chest x-ray to look for cardiopulmonary process.  Chest x-ray independently reviewed and evaluated by me.  There is a questionable loculation in the left lung base anteriorly.  The remainder the lung  spaces are well pneumatized.  Radiology overread is pending. Radiology impression is that of no active cardiopulmonary disease.  Given patient's sweats, wheezes, and productive cough we will treat patient for a COPD exacerbation.  Patient on cefdinir twice daily for 10 days as well as a round burst dose prednisone 60 mg daily for 5 days.  ER and return precautions reviewed with patient.   Final Clinical Impressions(s) / UC Diagnoses    Final diagnoses:  COPD exacerbation [facility])     Discharge Instructions      Your chest x-ray did not demonstrate any signs of infection per the radiologist report.  I will treat you for a COPD exacerbation.  I want you to continue your inhalers as previously prescribed taking your Advair twice daily and using your albuterol inhaler every 4-6 hours as needed for shortness of breath and wheezing.  Start the prednisone this morning and you will take 60 mg each day for period of 5 days.  The prednisone  is to decrease inflammation in your lungs and make an easier for you to breathe.  Start the cefdinir today and take it twice daily with food for 10 days for treatment of your COPD exacerbation.  If you have any worsening shortness of breath, wheezing, increased production of sputum, or fever you need to return for reevaluation or go to the emergency department.     ED Prescriptions     Medication Sig Dispense Auth. Provider   cefdinir (OMNICEF) 300 MG capsule Take 1 capsule (300 mg total) by mouth 2 (two) times daily for 10 days. 20 capsule Margarette Canada, NP   predniSONE (DELTASONE) 20 MG tablet Take 3 tablets (60 mg total) by mouth daily with breakfast for 5 days. 3 tablets daily for 5 days. 15 tablet Margarette Canada, NP      PDMP not reviewed this encounter.   Margarette Canada, NP 10/01/21 [phone]

## 2021-10-01 NOTE — ED Triage Notes (Signed)
Patient c/o cough and chest congestion that started last Saturday.  Patient denies fevers.  Patient states that his cough has gotten worse since the past 2-3 days.  Patient did a home covid test yesterday and was negative.

## 2021-10-03 ENCOUNTER — Other Ambulatory Visit: Payer: Self-pay | Admitting: Family Medicine

## 2021-10-03 DIAGNOSIS — J4520 Mild intermittent asthma, uncomplicated: Secondary | ICD-10-CM

## 2021-10-03 NOTE — Telephone Encounter (Signed)
Requested Prescriptions  Pending Prescriptions Disp Refills  . albuterol (VENTOLIN HFA) 108 (90 Base) MCG/ACT inhaler [Pharmacy Med Name: Albuterol Sulfate HFA 108 (90 Base) MCG/ACT Inhalation Aerosol Solution] 9 g 0    Sig: INHALE 1 TO 2 PUFFS BY MOUTH EVERY 4 HOURS AS NEEDED FOR WHEEZING OR SHORTNESS OF BREATH     Pulmonology:  Beta Agonists Failed - 10/03/2021 12:38 PM      Failed - One inhaler should last at least one month. If the patient is requesting refills earlier, contact the patient to check for uncontrolled symptoms.      Passed - Valid encounter within last 12 months    Recent Outpatient Visits          6 months ago Annual physical exam   [place], DO   8 months ago Epidermoid cyst of skin of back   [place], [place]   1 year ago Uncontrolled type 2 diabetes mellitus with peripheral neuropathy [facility])   [place], DO   1 year ago Annual physical exam   [place], DO   2 years ago Uncontrolled type 2 diabetes mellitus with peripheral neuropathy [facility])   [facility], Devonne Doughty, DO

## 2021-10-05 ENCOUNTER — Other Ambulatory Visit: Payer: Self-pay | Admitting: Family Medicine

## 2021-10-05 NOTE — Telephone Encounter (Signed)
Patient needs office visit for further refills. Requested Prescriptions  Pending Prescriptions Disp Refills  . TRULICITY 3 OB/0.9GG SOPN [Pharmacy Med Name: Trulicity 3 EZ/6.6QH Subcutaneous Solution Pen-injector] 4 mL 0    Sig: INJECT 3MG   SUBCUTANEOUSLY ONCE A WEEK     Endocrinology:  Diabetes - GLP-1 Receptor Agonists Failed - 10/05/2021  1:38 PM      Failed - HBA1C is between 0 and 7.9 and within 180 days    Hgb A1c MFr Bld  Date Value Ref Range Status  03/18/2021 8.3 (H) <5.7 % of total Hgb Final    Comment:    For someone without known diabetes, a hemoglobin A1c value of 6.5% or greater indicates that they may have  diabetes and this should be confirmed with a follow-up  test. . For someone with known diabetes, a value <7% indicates  that their diabetes is well controlled and a value  greater than or equal to 7% indicates suboptimal  control. A1c targets should be individualized based on  duration of diabetes, age, comorbid conditions, and  other considerations. . Currently, no consensus exists regarding use of hemoglobin A1c for diagnosis of diabetes for children. .          Failed - Valid encounter within last 6 months    Recent Outpatient Visits          6 months ago Annual physical exam   [place], DO   8 months ago Epidermoid cyst of skin of back   [place], DO   1 year ago Uncontrolled type 2 diabetes mellitus with peripheral neuropathy [facility])   [place], DO   1 year ago Annual physical exam   [place], DO   2 years ago Uncontrolled type 2 diabetes mellitus with peripheral neuropathy [facility])   [facility], Devonne Doughty, DO

## 2021-11-04 ENCOUNTER — Encounter: Payer: Self-pay | Admitting: Family Medicine

## 2021-11-04 ENCOUNTER — Other Ambulatory Visit: Payer: Self-pay | Admitting: Family Medicine

## 2021-11-04 ENCOUNTER — Other Ambulatory Visit: Payer: Self-pay

## 2021-11-04 ENCOUNTER — Ambulatory Visit: Payer: BC Managed Care – PPO | Admitting: Family Medicine

## 2021-11-04 VITALS — BP 150/70 | HR 83 | Ht 70.5 in | Wt 237.2 lb

## 2021-11-04 DIAGNOSIS — E1142 Type 2 diabetes mellitus with diabetic polyneuropathy: Secondary | ICD-10-CM | POA: Diagnosis not present

## 2021-11-04 DIAGNOSIS — E1169 Type 2 diabetes mellitus with other specified complication: Secondary | ICD-10-CM

## 2021-11-04 DIAGNOSIS — C61 Malignant neoplasm of prostate: Secondary | ICD-10-CM

## 2021-11-04 DIAGNOSIS — M159 Polyosteoarthritis, unspecified: Secondary | ICD-10-CM | POA: Diagnosis not present

## 2021-11-04 DIAGNOSIS — I10 Essential (primary) hypertension: Secondary | ICD-10-CM

## 2021-11-04 DIAGNOSIS — Z0000 Encounter for general adult medical examination without abnormal findings: Secondary | ICD-10-CM

## 2021-11-04 LAB — POCT GLYCOSYLATED HEMOGLOBIN (HGB A1C): Hemoglobin A1C: 8.3 % — AB (ref 4.0–5.6)

## 2021-11-04 MED ORDER — TRULICITY 3 MG/0.5ML ~~LOC~~ SOAJ: 3.0000 mg | SUBCUTANEOUS | 11 refills | Status: DC

## 2021-11-04 MED ORDER — TRAMADOL HCL 50 MG PO TABS: 50.0000 mg | ORAL_TABLET | Freq: Three times a day (TID) | ORAL | 2 refills | Status: DC | PRN

## 2021-11-04 NOTE — Assessment & Plan Note (Signed)
Improved control on Tramadol / Diclofenac topical, limited other options Underlying etiology of chronic pain with OA/DJD multiple joints Affecting knees, feet, and other joints. Imaging with bone scan 2008 for cancer found reduced cartilage on joints No recent X-rays Improved on Tramadol in past - some nausea - refill Tramadol, checked PDMP Failed multiple NSAIDs  Follow-up future re-eval consider steroid inj knee/shoulders, future PT, ortho as need

## 2021-11-04 NOTE — Assessment & Plan Note (Signed)
Stable at A1c to 8.3 on higher GLP1 trulicity No hypoglycemia Complications - peripheral neuropathy L>R Failed: Victoza (cost), Bydureon (local reaction, knot) - Tyler Aas (cost), Ozempic (ineffective), Contraindicated SGLT2 (bladder cancer)  Plan:  - Continue Trulicity 3.0 mg weekly - Continue Lantus 70u daily eventually goal to reduce dose - Continue Metformin 1000mg  BID 2. Encourage improved lifestyle - low carb, low sugar diet, reduce portion size, continue improving regular exercise 3. Check CBG , bring log to next visit for review - Continue Freestyle Libre DM Foot Request DM Eye 4. Continue ARB - consider ASA, Statin in future again or alternative PSK9 inhib - he has declined due to statin intolerance 5. Last updated DM [place] in [place] request copy

## 2021-11-04 NOTE — Patient Instructions (Addendum)
Thank you for coming to the office today.  Cologuard was negative 03/2021, next due in 3 years 03/2024  Fingerstick A1c today, stay tuned.  BP reading repeat today is improved, 150/70, check home BP readings goal < 140 / 90 if possible.  DUE for FASTING BLOOD WORK (no food or drink after midnight before the lab appointment, only water or coffee without cream/sugar on the morning of)  SCHEDULE "Lab Only" visit in the morning at the clinic for lab draw in 5 MONTHS   - Make sure Lab Only appointment is at about 1 week before your next appointment, so that results will be available  For Lab Results, once available within 2-3 days of blood draw, you can can log in to MyChart online to view your results and a brief explanation. Also, we can discuss results at next follow-up visit.   Please schedule a Follow-up Appointment to: Return in about 5 months (around 04/04/2022) for 5 month fasting lab only then 1 weekly Annual Physical.  If you have any other questions or concerns, please feel free to call the office or send a message through [place]. You may also schedule an earlier appointment if necessary.  Additionally, you may be receiving a survey about your experience at our office within a few days to 1 week by e-mail or mail. We value your feedback.  [name], DO [place]

## 2021-11-04 NOTE — Progress Notes (Signed)
Subjective:    Patient ID: Henry Horne, male    DOB: 01-02-1951, 70 y.o.   MRN: [id number]  Henry Horne is a 70 y.o. male presenting on 11/04/2021 for Diabetes   HPI   CHRONIC DM, Type 2 with history of DM Neuropathy He is doing well but admits not adhering to lifestyle - Lab results A1c previously 10 > 8 range Due today CBGs: reviewed - He has Freestyle [place] sensor and keeping track of it. Meds: Lantus 53Z morning, Trulicity 3mg  weekly [place] (increased from previous visit 1.5), Metformin 1000mg  BID Reports good compliance. Tolerating well w/o side-effects Currently on ARB Lifestyle: - Diet (Improving diet overall, limiting sugars/carbs - admits poor diet often, more sweets) - Exercise (limited regular exercise, knee chronic joint pain) - Last DM Eye exam at [facility] in [place], 2022 will request copy - Prior history of nerve testing L>R reduced sensation Admits some numbness tingling feet Denies hypoglycemia   HYPERLIPIDEMIA / Obesity BMI >33 - Reports no concerns. Last lipid 02/2021, mild elevated LDL but stable overall - Not on statin therapy, failed multiple statins and zetia in past due to statin myalgia intolerance Not interested in PCSK9     Chronic Pain / osteoarthritis multiple joints Doing well on Tramadol 50mg  daily PRN recently worse with back and shoulders Needs extra refills.     Health Maintenance:   Colon CA Screening: Cologuard negative 03/2021, repeat 3 yrs.  History of prior shingles.  Depression screen [facility] 2/9 03/25/2021 09/24/2020 01/29/2020  Decreased Interest 0 0 1  Down, Depressed, Hopeless 2 0 1  PHQ - 2 Score 2 0 2  Altered sleeping 1 - 2  Tired, decreased energy 2 - 2  Change in appetite 0 - 1  Feeling bad or failure about yourself  1 - 1  Trouble concentrating 0 - 1  Moving slowly or fidgety/restless 0 - 1  Suicidal thoughts 0 - 1  PHQ-9 Score 6 - 11  Difficult doing work/chores Not difficult at all - Somewhat difficult   Some recent data might be hidden    Social History   Tobacco Use   Smoking status: Former    Years: 30.00    Types: Cigarettes    Quit date: 05/18/2004    Years since quitting: 17.4   Smokeless tobacco: Former  Scientific laboratory technician Use: Never used  Substance Use Topics   Alcohol use: No    Alcohol/week: 0.0 standard drinks   Drug use: No    Review of Systems Per HPI unless specifically indicated above     Objective:    BP (!) 150/70 (BP Location: Left Arm, Patient Position: Sitting, Cuff Size: Normal)   Pulse 83   Ht 5' 10.5" (1.791 m)   Wt 237 lb 3.2 oz (107.6 kg)   SpO2 99%   BMI 33.55 kg/m   Wt Readings from Last 3 Encounters:  11/04/21 237 lb 3.2 oz (107.6 kg)  10/01/21 236 lb 15.9 oz (107.5 kg)  03/25/21 237 lb (107.5 kg)    Physical Exam Vitals and nursing note reviewed.  Constitutional:      General: He is not in acute distress.    Appearance: He is well-developed. He is obese. He is not diaphoretic.     Comments: Well-appearing, comfortable, cooperative  HENT:     Head: Normocephalic and atraumatic.  Eyes:     General:        Right eye: No discharge.  Left eye: No discharge.     Conjunctiva/sclera: Conjunctivae normal.  Neck:     Thyroid: No thyromegaly.  Cardiovascular:     Rate and Rhythm: Normal rate and regular rhythm.     Pulses: Normal pulses.     Heart sounds: Normal heart sounds. No murmur heard. Pulmonary:     Effort: Pulmonary effort is normal. No respiratory distress.     Breath sounds: Normal breath sounds. No wheezing or rales.  Musculoskeletal:        General: Normal range of motion.     Cervical back: Normal range of motion and neck supple.  Lymphadenopathy:     Cervical: No cervical adenopathy.  Skin:    General: Skin is warm and dry.     Findings: No erythema or rash.  Neurological:     Mental Status: He is alert and oriented to person, place, and time. Mental status is at baseline.  Psychiatric:        Behavior:  Behavior normal.     Comments: Well groomed, good eye contact, normal speech and thoughts   Recent Labs    03/18/21 0754 11/04/21 1012  HGBA1C 8.3* 8.3*     Results for orders placed or performed in visit on 04/23/21  Cologuard  Result Value Ref Range   Cologuard Negative Negative      Assessment & Plan:   Problem List Items Addressed This Visit     Type 2 diabetes mellitus with peripheral neuropathy ([place]) - Primary    Stable at A1c to 8.3 on higher GLP1 trulicity No hypoglycemia Complications - peripheral neuropathy L>R Failed: Victoza (cost), Bydureon (local reaction, knot) - Tyler Aas (cost), Ozempic (ineffective), Contraindicated SGLT2 (bladder cancer)  Plan:  - Continue Trulicity 3.0 mg weekly - Continue Lantus 70u daily eventually goal to reduce dose - Continue Metformin 1000mg  BID 2. Encourage improved lifestyle - low carb, low sugar diet, reduce portion size, continue improving regular exercise 3. Check CBG , bring log to next visit for review - Continue Freestyle [place] DM Foot Request DM Eye 4. Continue ARB - consider ASA, Statin in future again or alternative PSK9 inhib - he has declined due to statin intolerance 5. Last updated DM [place] in [place] request copy      Relevant Medications   TRULICITY 3 VH/8.4ON SOPN   Other Relevant Orders   POCT glycosylated hemoglobin (Hb A1C) (Completed)   Osteoarthritis of multiple joints    Improved control on Tramadol / Diclofenac topical, limited other options Underlying etiology of chronic pain with OA/DJD multiple joints Affecting knees, feet, and other joints. Imaging with bone scan 2008 for cancer found reduced cartilage on joints No recent X-rays Improved on Tramadol in past - some nausea - refill Tramadol, checked PDMP Failed multiple NSAIDs  Follow-up future re-eval consider steroid inj knee/shoulders, future PT, ortho as need      Relevant Medications   traMADol (ULTRAM) 50 MG tablet    Meds  ordered this encounter  Medications   TRULICITY 3 GE/9.5MW SOPN    Sig: Inject 3 mg as directed once a week.    Dispense:  2 mL    Refill:  11    Patient needs office visit for further refills.   traMADol (ULTRAM) 50 MG tablet    Sig: Take 1 tablet (50 mg total) by mouth every 8 (eight) hours as needed.    Dispense:  30 tablet    Refill:  2    Add refills  Follow up plan: Return in about 5 months (around 04/04/2022) for 5 month fasting lab only then 1 weekly Annual Physical.  Future labs ordered for 03/2022  [name], [facility] 11/04/2021, 9:31 AM

## 2022-01-30 ENCOUNTER — Encounter: Payer: Self-pay | Admitting: Emergency Medicine

## 2022-01-30 ENCOUNTER — Ambulatory Visit (INDEPENDENT_AMBULATORY_CARE_PROVIDER_SITE_OTHER)
Admission: EM | Admit: 2022-01-30 | Discharge: 2022-01-30 | Disposition: A | Discharge status: Home / Self Care | Payer: BC Managed Care – PPO | Source: Home / Self Care

## 2022-01-30 ENCOUNTER — Other Ambulatory Visit: Payer: Self-pay

## 2022-01-30 ENCOUNTER — Emergency Department: Payer: BC Managed Care – PPO

## 2022-01-30 ENCOUNTER — Emergency Department
Admission: EM | Admit: 2022-01-30 | Discharge: 2022-01-31 | Disposition: A | Discharge status: Home / Self Care | Payer: BC Managed Care – PPO | Attending: Emergency Medicine | Admitting: Emergency Medicine

## 2022-01-30 DIAGNOSIS — R519 Headache, unspecified: Secondary | ICD-10-CM | POA: Insufficient documentation

## 2022-01-30 DIAGNOSIS — R0689 Other abnormalities of breathing: Secondary | ICD-10-CM | POA: Diagnosis not present

## 2022-01-30 DIAGNOSIS — Z23 Encounter for immunization: Secondary | ICD-10-CM | POA: Insufficient documentation

## 2022-01-30 DIAGNOSIS — J441 Chronic obstructive pulmonary disease with (acute) exacerbation: Secondary | ICD-10-CM

## 2022-01-30 DIAGNOSIS — R059 Cough, unspecified: Secondary | ICD-10-CM | POA: Diagnosis not present

## 2022-01-30 DIAGNOSIS — Z043 Encounter for examination and observation following other accident: Secondary | ICD-10-CM | POA: Diagnosis not present

## 2022-01-30 DIAGNOSIS — S0003XA Contusion of scalp, initial encounter: Secondary | ICD-10-CM | POA: Diagnosis not present

## 2022-01-30 DIAGNOSIS — R5383 Other fatigue: Secondary | ICD-10-CM | POA: Insufficient documentation

## 2022-01-30 DIAGNOSIS — S199XXA Unspecified injury of neck, initial encounter: Secondary | ICD-10-CM | POA: Diagnosis not present

## 2022-01-30 DIAGNOSIS — R739 Hyperglycemia, unspecified: Secondary | ICD-10-CM | POA: Diagnosis not present

## 2022-01-30 DIAGNOSIS — R051 Acute cough: Secondary | ICD-10-CM | POA: Insufficient documentation

## 2022-01-30 DIAGNOSIS — Z85528 Personal history of other malignant neoplasm of kidney: Secondary | ICD-10-CM | POA: Diagnosis not present

## 2022-01-30 DIAGNOSIS — W109XXA Fall (on) (from) unspecified stairs and steps, initial encounter: Secondary | ICD-10-CM | POA: Diagnosis not present

## 2022-01-30 DIAGNOSIS — Z20822 Contact with and (suspected) exposure to covid-19: Secondary | ICD-10-CM | POA: Insufficient documentation

## 2022-01-30 DIAGNOSIS — E119 Type 2 diabetes mellitus without complications: Secondary | ICD-10-CM | POA: Insufficient documentation

## 2022-01-30 DIAGNOSIS — J432 Centrilobular emphysema: Secondary | ICD-10-CM | POA: Insufficient documentation

## 2022-01-30 DIAGNOSIS — I10 Essential (primary) hypertension: Secondary | ICD-10-CM | POA: Diagnosis not present

## 2022-01-30 DIAGNOSIS — S065XAA Traumatic subdural hemorrhage with loss of consciousness status unknown, initial encounter: Secondary | ICD-10-CM

## 2022-01-30 DIAGNOSIS — R42 Dizziness and giddiness: Secondary | ICD-10-CM | POA: Insufficient documentation

## 2022-01-30 DIAGNOSIS — W19XXXA Unspecified fall, initial encounter: Secondary | ICD-10-CM

## 2022-01-30 DIAGNOSIS — S065X0A Traumatic subdural hemorrhage without loss of consciousness, initial encounter: Secondary | ICD-10-CM | POA: Diagnosis not present

## 2022-01-30 DIAGNOSIS — Z8546 Personal history of malignant neoplasm of prostate: Secondary | ICD-10-CM | POA: Insufficient documentation

## 2022-01-30 DIAGNOSIS — S0990XA Unspecified injury of head, initial encounter: Secondary | ICD-10-CM | POA: Diagnosis not present

## 2022-01-30 DIAGNOSIS — E785 Hyperlipidemia, unspecified: Secondary | ICD-10-CM | POA: Insufficient documentation

## 2022-01-30 DIAGNOSIS — R0602 Shortness of breath: Secondary | ICD-10-CM | POA: Diagnosis not present

## 2022-01-30 LAB — CBC
HCT: 43.9 % (ref 39.0–52.0)
Hemoglobin: 13.9 g/dL (ref 13.0–17.0)
MCH: 25.6 pg — ABNORMAL LOW (ref 26.0–34.0)
MCHC: 31.7 g/dL (ref 30.0–36.0)
MCV: 80.7 fL (ref 80.0–100.0)
Platelets: 236 10*3/uL (ref 150–400)
RBC: 5.44 MIL/uL (ref 4.22–5.81)
RDW: 13.4 % (ref 11.5–15.5)
WBC: 15 10*3/uL — ABNORMAL HIGH (ref 4.0–10.5)
nRBC: 0 % (ref 0.0–0.2)

## 2022-01-30 LAB — BASIC METABOLIC PANEL
Anion gap: 9 (ref 5–15)
BUN: 15 mg/dL (ref 8–23)
CO2: 20 mmol/L — ABNORMAL LOW (ref 22–32)
Calcium: 8.4 mg/dL — ABNORMAL LOW (ref 8.9–10.3)
Chloride: 101 mmol/L (ref 98–111)
Creatinine, Ser: 1.21 mg/dL (ref 0.61–1.24)
GFR, Estimated: >60 mL/min (ref >60)
Glucose, Bld: 336 mg/dL — ABNORMAL HIGH (ref 70–99)
Potassium: 4.4 mmol/L (ref 3.5–5.1)
Sodium: 130 mmol/L — ABNORMAL LOW (ref 135–145)

## 2022-01-30 LAB — RESP PANEL BY RT-PCR (FLU A&B, COVID) ARPGX2
Influenza A by PCR: NEGATIVE
Influenza B by PCR: NEGATIVE
SARS Coronavirus 2 by RT PCR: NEGATIVE

## 2022-01-30 LAB — TROPONIN I (HIGH SENSITIVITY): Troponin I (High Sensitivity): 6 ng/L (ref <18)

## 2022-01-30 MED ORDER — SODIUM CHLORIDE 0.9 % IV BOLUS
1000.0000 mL | Freq: Once | INTRAVENOUS | Status: AC
  Administered 2022-01-30: 1000 mL via INTRAVENOUS

## 2022-01-30 MED ORDER — PREDNISONE 20 MG PO TABS: 40.0000 mg | ORAL_TABLET | Freq: Every day | ORAL | 0 refills | Status: AC

## 2022-01-30 MED ORDER — AZITHROMYCIN 250 MG PO TABS: 250.0000 mg | ORAL_TABLET | Freq: Every day | ORAL | 0 refills | Status: DC

## 2022-01-30 NOTE — ED Notes (Signed)
PT PLACED ON 2L IN TRIAGE DUE TO 91-94% SAT ON ROOM AIR.  ?

## 2022-01-30 NOTE — Discharge Instructions (Signed)
-  You are negative for flu and COVID. ?- I believe you are having a COPD exacerbation.  Could be due to a virus but given your history of COPD, I have sent an antibiotic and a corticosteroid to help.  Continue your inhalers at home. ?- Increase rest and fluids and continue the cough medicine as well.  Think about switching to Mucinex. ?- Return or go to ER for fever, increased fatigue, chest pain or worsening breathing.  Go to ER for any significant change in breathing. ?

## 2022-01-30 NOTE — ED Provider Notes (Signed)
[facility] Provider Note    Event Date/Time   First MD Initiated Contact with Patient 01/30/22 2303     (approximate)   History   Fall   HPI  Henry Horne is a 71 y.o. male  with pmh hypertension, diabetes and hyperlipidemia who presents after a fall.  Patient has had cough and been feeling generally unwell for about 2 weeks worsening over the last 2 days.  He went to urgent care today was prescribed prednisone and azithromycin for COPD exacerbation.  When he got up today from a chair he was somewhat lightheaded and almost fell backward, his wife had to help him.  He then went to [facility] picked up the medications he was prescribed from urgent care and then when he was coming into the house he slipped from 2 stairs fell backward hitting his head.  He is not sure if he had loss of consciousness.  His wife says that she came out and he was able to talk immediately.  Denies any preceding chest pain or palpitations.  Patient denies shortness of breath but has had cough productive of what he calls gray sputum.  No fevers or chills.  Denies nausea vomiting than 1 episode after the initial fall.  He denies diarrhea or urinary symptoms.  He is not on any blood thinners.     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   Obesity (BMI 30.0-34.9) 01/17/2018   Osteoarthritis of multiple joints 10/04/2017   Asthma 12/30/2016   ACE-inhibitor cough 12/30/2016   Agitation 06/16/2016   Personal history of bladder cancer 05/12/2016   Type 2 diabetes mellitus with peripheral neuropathy ([place]) 12/25/2015   Hyperlipidemia associated with type 2  diabetes mellitus ([place]) 11/02/2015   Asymptomatic bacteriuria 10/08/2015   Essential hypertension 07/29/2015   Urinary obstruction, not elsewhere classified 06/19/2013   Personal history of prostate cancer 12/19/2012   Family history of malignant neoplasm of prostate 12/12/2012   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      Excl. in [place]?     Most recent vital signs: Vitals:   01/31/22 0236 01/31/22 0459  BP: (!) 152/77 (!) 126/107  Pulse: 95 91  Resp: 14 16  Temp:    SpO2: 96% 97%     General: Awake, no distress.  CV:  Good peripheral perfusion.  Resp:  Mildly tachypneic, expiratory wheezing, decreased air movement Abd:  No distention.  No abdominal tenderness Neuro:             Awake, Alert, Oriented x 3  Other: Aox3, nml speech  PERRL, EOMI, face symmetric, nml tongue movement  5/5 strength in the BL upper and lower extremities  Sensation grossly intact in the BL upper and lower extremities  Finger-nose-finger intact BL    ED Results / Procedures / Treatments  Labs (all labs ordered are listed, but only abnormal results are displayed) Labs Reviewed  BASIC METABOLIC PANEL - Abnormal; Notable for the following components:      Result Value   Sodium 130 (*)    CO2 20 (*)    Glucose, Bld 336 (*)    Calcium 8.4 (*)    All other components within normal limits  CBC - Abnormal; Notable for the  following components:   WBC 15.0 (*)    MCH 25.6 (*)    All other components within normal limits  URINALYSIS, COMPLETE (UACMP) WITH MICROSCOPIC - Abnormal; Notable for the following components:   Color, Urine YELLOW (*)    APPearance CLEAR (*)    Glucose, UA >=500 (*)    Hgb urine dipstick SMALL (*)    Ketones, ur 20 (*)    Protein, ur 30 (*)    All other components within normal limits  RESP PANEL BY RT-PCR (FLU A&B, COVID) ARPGX2  TROPONIN I (HIGH SENSITIVITY)  TROPONIN I (HIGH SENSITIVITY)     EKG  EKG interpretation performed by myself: NSR, LAD, nml intervals, no acute ischemic changes    RADIOLOGY I reviewed the CXR which does not show any acute cardiopulmonary process; agree with radiology report   I reviewed the CT of the head which shows small volume subdural along the anterior falx   PROCEDURES:  Critical Care performed: Yes, see critical care procedure note(s)  .1-3 Lead EKG Interpretation Performed by: Rada Hay, MD Authorized by: Rada Hay, MD     Interpretation: normal     ECG rate assessment: normal     Rhythm: sinus rhythm     Ectopy: none     Conduction: normal    The patient is on the cardiac monitor to evaluate for evidence of arrhythmia and/or significant heart rate changes.   MEDICATIONS ORDERED IN ED: Medications  sodium chloride 0.9 % bolus 1,000 mL (0 mLs Intravenous Stopped 01/31/22 0025)  ipratropium-albuterol (DUONEB) 0.5-2.5 (3) MG/3ML nebulizer solution 3 mL (3 mLs Nebulization Given 01/31/22 0030)  Tdap (BOOSTRIX) injection 0.5 mL (0.5 mLs Intramuscular Given 01/31/22 0027)     IMPRESSION / MDM / ASSESSMENT AND PLAN / ED COURSE  I reviewed the triage vital signs and the nursing notes.                              Differential diagnosis includes, but is not limited to, intracranial hemorrhage, subdural, subarachnoid, concussion, syncope, pneumonia, COPD exacerbation  Patient is a 71 year old male with a history of  COPD who presents after a fall.  Patient was walking up the stairs to his house when he fell backwards.  Of note patient has been had being a cough with wheezing over the last 2 weeks was seen in urgent care today prescribed prednisone and  azithromycin.  Took the first dose today and felt somewhat lightheaded afterward.  Patient's wife notes that he had a presyncopal episode after standing up from his chair before the fall.  Patient is unclear if he lost consciousness.  On exam he has a contusion to the back of his head but no open laceration requiring repair.  He complains of a headache had 1 episode of emesis but otherwise his neurologic exam is nonfocal.  He is hypertensive but vital signs otherwise within normal limits.  I reviewed the CT head which shows an acute subdural with small volume of blood products along the anterior falx.  Discussed with Dr. Cari Caraway neurosurgery on-call who recommends getting a repeat head CT in 6 hours.  He is not on any blood thinners.  Will also treat him for COPD exacerbation with DuoNeb.  He also took prednisone already today as well as Z-Pak.  He has a white count of 15, no pneumonia observed on x-ray, we will also send the urine.  He complains of no abdominal pain.  We will also send COVID test.  Reviewed his EKG which has no acute abnormalities.  Troponin is negative.  For repeat CT head at 4:15 AM.  Pete CT head shows unchanged area of subdural along the falx, however there is a small 4 mm subdural hematoma on the left which is new.  Discussed again with Dr. Cari Caraway who did not feel that the new finding on CT warrant any additional imaging or admission..  Patient clinically remains unchanged with nonfocal neurologic exam no confusion or altered mental status, though I agree that discharge is appropriate.  Discussed the findings with the patient and his wife and discussed return precautions for confusion vomiting or any focal numbness or weakness.  Dr. Cari Caraway will  arrange for repeat CT head as an outpatient.  Suspect that patient also has an exacerbation of his COPD but is not hypoxic and work of breathing is improved after DuoNeb in the ED.  Not feel that he requires admission for his COPD exacerbation.  He is already been prescribed prednisone.  Recommended ongoing family use and taking the prednisone that he was prescribed.  Clinical Course as of 01/31/22 0554  Mon Jan 31, 2022  0521 CT Cervical Spine Wo Contrast [KM]    Clinical Course User Index [KM] Rada Hay, MD     FINAL CLINICAL IMPRESSION(S) / ED DIAGNOSES   Final diagnoses:  Subdural hematoma  Fall, initial encounter     Rx / DC Orders   ED Discharge Orders     None        Note:  This document was prepared using Dragon voice recognition software and may include unintentional dictation errors.   Rada Hay, MD 01/31/22 [phone]

## 2022-01-30 NOTE — ED Triage Notes (Addendum)
Pt arrived via [facility] from home where he fell backwards while walking and hit posterior head on concrete garden blocks. Unknown LOC. Laceration noted to posterior head with bleeding controlled and bandage in place. Per EMS, pt was diaphoretic on scene but was A&O x4. Denies blood thinner.  ? ?CBG 384  ?Received 456m NS in route ?18G in rt forearm  ? ?[facility], [facility]. Tested for COVID, unknown results per pt.  ?

## 2022-01-30 NOTE — ED Provider Notes (Signed)
[facility]    CSN: [id number] Arrival date & time: 01/30/22  0850      History   Chief Complaint Chief Complaint  Patient presents with   Fever   Dizziness    HPI Henry Horne is a 71 y.o. male presenting for onset of fatigue, body aches, headaches, cough, congestion yesterday.  He denies any fevers, chills or sweats.  Patient has a history of COPD.  Denies any increase in shortness of breath from his baseline.  Denies abdominal pain, nausea/vomiting or diarrhea.  No sick contacts or known exposure to flu or COVID-19.  Patient has been taking diabetic cough medication over-the-counter.  Other past medical history is significant for hypertension, hyperlipidemia, previous history of prostate, kidney and bladder cancer.  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   Asthma 12/30/2016   ACE-inhibitor cough 12/30/2016   Agitation 06/16/2016   Personal history of bladder cancer 05/12/2016   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/25/2015   Hyperlipidemia associated with type 2 diabetes mellitus ([place]) 11/02/2015   Asymptomatic bacteriuria 10/08/2015   Essential hypertension 07/29/2015   Urinary obstruction, not elsewhere classified 06/19/2013   Personal history of prostate cancer 12/19/2012   Family history of malignant neoplasm of prostate 12/12/2012   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 Authorizing Provider  albuterol (VENTOLIN HFA) 108 (90 Base) MCG/ACT inhaler INHALE 1 TO 2 PUFFS BY MOUTH EVERY 4 HOURS AS NEEDED FOR WHEEZING OR SHORTNESS OF BREATH 10/03/21  Yes Karamalegos, Devonne Doughty, DO  amLODipine (NORVASC) 5 MG tablet Take 1 tablet (5 mg total) by mouth daily. 03/25/21  Yes Karamalegos, Devonne Doughty, DO  azithromycin (ZITHROMAX) 250 MG tablet Take 1 tablet (250 mg total) by mouth daily. Take first 2 tablets together, then 1 every day until finished. 01/30/22  Yes Laurene Footman B, PA-C  diclofenac sodium (VOLTAREN) 1 % GEL Apply 2 g topically 3 (three) times daily as needed. For knees 04/18/18  Yes Karamalegos, Devonne Doughty, DO  FLUoxetine (PROZAC) 40 MG capsule Take 1 capsule (40 mg total) by mouth daily. 03/25/21  Yes Karamalegos, Devonne Doughty, DO  fluticasone (FLONASE) 50 MCG/ACT nasal spray Place 2 sprays into both nostrils daily. Use for 4-6 weeks then stop and use seasonally or as needed. 02/03/21  Yes Karamalegos, Devonne Doughty, DO  Fluticasone-Salmeterol (ADVAIR) 250-50 MCG/DOSE AEPB Inhale 1 puff into the lungs 2 (two) times daily. 10/01/21  Yes Margarette Canada, NP  Garlic Oil 2 MG  CAPS Take 1,000 mg by mouth 2 (two) times daily. Reported on 11/17/2015 12/12/12  Yes [provider]  insulin glargine (LANTUS) 100 UNIT/ML injection INJECT 70 UNITS SUBCUTANEOUSLY ONCE DAILY 03/25/21  Yes Karamalegos, Devonne Doughty, DO  losartan (COZAAR) 50 MG tablet Take 1 tablet (50 mg total) by mouth daily. 03/25/21  Yes Karamalegos, Devonne Doughty, DO  metFORMIN (GLUCOPHAGE) 1000 MG tablet Take 1 tablet (1,000 mg total) by mouth 2 (two) times daily with a  meal. 03/25/21  Yes Karamalegos, Devonne Doughty, DO  omeprazole (PRILOSEC) 20 MG capsule Take 20 mg by mouth daily.   Yes [provider]  predniSONE (DELTASONE) 20 MG tablet Take 2 tablets (40 mg total) by mouth daily for 5 days. 01/30/22 02/04/22 Yes Danton Clap, PA-C  traMADol (ULTRAM) 50 MG tablet Take 1 tablet (50 mg total) by mouth every 8 (eight) hours as needed. 11/04/21  Yes Karamalegos, Alexander J, DO  TRULICITY 3 PR/9.1MB SOPN Inject 3 mg as directed once a week. 11/04/21  Yes Karamalegos, Devonne Doughty, DO  Continuous Blood Gluc Receiver (FREESTYLE LIBRE 14 DAY READER) DEVI 1 each by Does not apply route every 14 (fourteen) days. Use to check blood sugar with Freestyle libre 07/25/18   Karamalegos, Devonne Doughty, DO  Continuous Blood Gluc Sensor (FREESTYLE LIBRE 14 DAY SENSOR) MISC USE TO CHECK BLOOD SUGAR AS NEEDED EVERY 14 DAYS. 10/13/19   Karamalegos, Devonne Doughty, DO  glucose blood (TRUETEST TEST) test strip 1 each by Other route 2 (two) times daily. Use as instructed 10/03/16   Arlis Porta., MD  imipramine (TOFRANIL) 10 MG tablet Take 1 tablet (10 mg total) by mouth at bedtime. 03/25/21   Karamalegos, Devonne Doughty, DO  Insulin Pen Needle (NOVOFINE) 30G X 8 MM MISC Use 1 nweedle to inject Victoza once daily. 10/03/16   Arlis Porta., MD  Insulin Pen Needle 31G X 5 MM MISC 1 each by Does not apply route 3 (three) times daily. 10/03/16   Arlis Porta., MD  Insulin Pen Needle 31G X 8 MM MISC Use as directed 11/29/17   Olin Hauser, DO  Insulin Syringe-Needle U-100 31G X 5/16" 1 ML MISC 100 each by Does not apply route 2 (two) times daily. 12/06/17   Parks Ranger, Devonne Doughty, DO  TRUEPLUS LANCETS 30G MISC Use 1 lancet  to check Blood sugar twice daily 10/03/16   Arlis Porta., MD    Family History Family History  Problem Relation Age of Onset   Diabetes Mother    Heart disease Father    Cancer Father    Hyperlipidemia Father    Hypertension Father     Diabetes Brother    COPD Brother    Autoimmune disease Brother    Heart disease Brother    Diabetes Brother     Social History Social History   Tobacco Use   Smoking status: Former    Years: 30.00    Types: Cigarettes    Quit date: 05/18/2004    Years since quitting: 17.7   Smokeless tobacco: Former  Scientific laboratory technician Use: Never used  Substance Use Topics   Alcohol use: No    Alcohol/week: 0.0 standard drinks   Drug use: No     Allergies   Zetia [ezetimibe], Ace inhibitors, Pneumococcal vaccine, and Pneumococcal vaccines   Review of Systems Review of Systems  Constitutional:  Positive for fatigue and fever. Negative for chills and diaphoresis.  HENT:  Positive for congestion and rhinorrhea. Negative for sinus pressure, sinus pain and sore throat.   Respiratory:  Positive for cough. Negative for shortness of breath and wheezing.   Cardiovascular:  Negative for chest pain.  Gastrointestinal:  Negative for abdominal pain, diarrhea, nausea and vomiting.  Musculoskeletal:  Positive for myalgias.  Neurological:  Positive for headaches. Negative for weakness and light-headedness.  Hematological:  Negative for adenopathy.    Physical Exam Triage Vital Signs ED Triage Vitals  Enc Vitals Group     BP 01/30/22 1008 (!) 157/83     Pulse Rate 01/30/22 1008 96     Resp 01/30/22 1008 15     Temp 01/30/22 1008 98.9 F (37.2 C)     Temp Source 01/30/22 1008 Oral     SpO2 01/30/22 1008 95 %     Weight 01/30/22 1003 230 lb (104.3 kg)     Height 01/30/22 1003 '5\' 10"'$  (1.778 m)     Head Circumference --      Peak Flow --      Pain Score 01/30/22 1003 0     Pain Loc --      Pain Edu? --      Excl. in [place]? --    No data found.  Updated Vital Signs BP (!) 157/83 (BP Location: Left Arm)    Pulse 96    Temp 98.9 F (37.2 C) (Oral)    Resp 15    Ht '5\' 10"'$  (1.778 m)    Wt 230 lb (104.3 kg)    SpO2 95%    BMI 33.00 kg/m      Physical Exam Vitals and nursing note reviewed.   Constitutional:      General: He is not in acute distress.    Appearance: Normal appearance. He is well-developed. He is ill-appearing (mildly).  HENT:     Head: Normocephalic and atraumatic.     Nose: Congestion present.     Mouth/Throat:     Mouth: Mucous membranes are moist.     Pharynx: Oropharynx is clear.  Eyes:     General: No scleral icterus.    Conjunctiva/sclera: Conjunctivae normal.  Cardiovascular:     Rate and Rhythm: Normal rate and regular rhythm.     Heart sounds: Normal heart sounds.  Pulmonary:     Effort: Pulmonary effort is normal. No respiratory distress.     Breath sounds: Wheezing (few scattered wheezes throughout, decreased breath sounds throughout) present.  Musculoskeletal:     Cervical back: Neck supple.  Skin:    General: Skin is warm and dry.     Capillary Refill: Capillary refill takes less than 2 seconds.  Neurological:     General: No focal deficit present.     Mental Status: He is alert. Mental status is at baseline.     Motor: No weakness.     Coordination: Coordination normal.     Gait: Gait normal.  Psychiatric:        Mood and Affect: Mood normal.        Behavior: Behavior normal.        Thought Content: Thought content normal.     UC Treatments / Results  Labs (all labs ordered are listed, but only abnormal results are displayed) Labs Reviewed  RESP PANEL BY RT-PCR (FLU A&B, COVID) [id number]    EKG   Radiology No results found.  Procedures Procedures (including critical care time)  Medications Ordered in UC Medications - No data to display  Initial  Impression / Assessment and Plan / UC Course  I have reviewed the triage vital signs and the nursing notes.  Pertinent labs & imaging results that were available during my care of the patient were reviewed by me and considered in my medical decision making (see chart for details).  71 year old male with history of COPD presents for cough, congestion, headache, body aches and  fatigue.  Symptom onset yesterday.  Patient is afebrile.  O2 sat is 95%.  He is mildly ill-appearing.  Positive nasal congestion and light yellowish drainage.  Few scattered wheezes throughout all lung fields.  Additionally, reduced breath sounds throughout.  Patient does not appear to be short of breath or in any distress.  Respiratory panel obtained.  All negative.  Discussed with patient that he is likely having COPD exacerbation versus may be allergy involvement.  Will treat with azithromycin, prednisone and also advised over-the-counter Claritin and continue the cough medication.  Increase rest and fluids.  Continue inhalers.  Reviewed return and ER precautions.   Final Clinical Impressions(s) / UC Diagnoses   Final diagnoses:  COPD exacerbation ([place])  Acute cough  Other fatigue     Discharge Instructions      -You are negative for flu and COVID. - I believe you are having a COPD exacerbation.  Could be due to a virus but given your history of COPD, I have sent an antibiotic and a corticosteroid to help.  Continue your inhalers at home. - Increase rest and fluids and continue the cough medicine as well.  Think about switching to Mucinex. - Return or go to ER for fever, increased fatigue, chest pain or worsening breathing.  Go to ER for any significant change in breathing.     ED Prescriptions     Medication Sig Dispense Auth. Provider   azithromycin (ZITHROMAX) 250 MG tablet Take 1 tablet (250 mg total) by mouth daily. Take first 2 tablets together, then 1 every day until finished. 6 tablet Laurene Footman B, PA-C   predniSONE (DELTASONE) 20 MG tablet Take 2 tablets (40 mg total) by mouth daily for 5 days. 10 tablet Gretta Cool      PDMP not reviewed this encounter.   Danton Clap, PA-C 01/30/22 1117

## 2022-01-30 NOTE — ED Triage Notes (Signed)
Patient c/o cough, congestion, body aches with symptoms starting yesterday.  ?

## 2022-01-31 ENCOUNTER — Emergency Department: Payer: BC Managed Care – PPO

## 2022-01-31 DIAGNOSIS — S0003XA Contusion of scalp, initial encounter: Secondary | ICD-10-CM | POA: Diagnosis not present

## 2022-01-31 LAB — URINALYSIS, COMPLETE (UACMP) WITH MICROSCOPIC
Bacteria, UA: NONE SEEN
Bilirubin Urine: NEGATIVE
Glucose, UA: >=500 mg/dL — AB
Ketones, ur: 20 mg/dL — AB
Leukocytes,Ua: NEGATIVE
Nitrite: NEGATIVE
Protein, ur: 30 mg/dL — AB
Specific Gravity, Urine: 1.028 (ref 1.005–1.030)
Squamous Epithelial / HPF: NONE SEEN (ref 0–5)
pH: 6 (ref 5.0–8.0)

## 2022-01-31 LAB — RESP PANEL BY RT-PCR (FLU A&B, COVID) ARPGX2
Influenza A by PCR: NEGATIVE
Influenza B by PCR: NEGATIVE
SARS Coronavirus 2 by RT PCR: NEGATIVE

## 2022-01-31 LAB — TROPONIN I (HIGH SENSITIVITY): Troponin I (High Sensitivity): 6 ng/L (ref <18)

## 2022-01-31 MED ORDER — TETANUS-DIPHTH-ACELL PERTUSSIS 5-2.5-18.5 LF-MCG/0.5 IM SUSY
0.5000 mL | PREFILLED_SYRINGE | Freq: Once | INTRAMUSCULAR | Status: AC
  Administered 2022-01-31: 0.5 mL via INTRAMUSCULAR
  Filled 2022-01-31: qty 0.5

## 2022-01-31 MED ORDER — IPRATROPIUM-ALBUTEROL 0.5-2.5 (3) MG/3ML IN SOLN
3.0000 mL | Freq: Once | RESPIRATORY_TRACT | Status: AC
  Administered 2022-01-31: 3 mL via RESPIRATORY_TRACT
  Filled 2022-01-31: qty 3

## 2022-01-31 NOTE — Discharge Instructions (Signed)
You were found to have a small amount of bleeding in your brain related to your fall.  You may experience headache, lightheadedness, or difficulty with memory due to the brain injury.  If you develop confusion vomiting or numbness or weakness on one side of your body, please return to the emergency department immediately.  You should receive a phone call from the neurosurgeon to schedule an appointment for repeat CT of the brain.  ?

## 2022-02-01 ENCOUNTER — Other Ambulatory Visit: Payer: Self-pay | Admitting: Neurosurgery

## 2022-02-01 DIAGNOSIS — S065XAA Traumatic subdural hemorrhage with loss of consciousness status unknown, initial encounter: Secondary | ICD-10-CM

## 2022-02-02 ENCOUNTER — Ambulatory Visit: Payer: Self-pay | Admitting: *Deleted

## 2022-02-02 NOTE — Telephone Encounter (Signed)
Reason for Disposition ? [1] MODERATE dizziness (e.g., interferes with normal activities) AND [2] has been evaluated by physician for this ?   Has appt tomorrow morning 02/03/2022 at 10:20 with Dr. Parks Ranger. ? ?Answer Assessment - Initial Assessment Questions ?1. DESCRIPTION: "Describe your dizziness." ?    I went to bed and laid flat for 1st time since Sunday.   I got up to bo to the bathroom.    I was real dizzy.   Wife helped me.   Sat at kitchen table for a while.   I checked my BP and glucose and they were a little high.I went back to bed.   ?2. LIGHTHEADED: "Do you feel lightheaded?" (e.g., somewhat faint, woozy, weak upon standing) ?    This morning I'm still very dizzy.    My glucose is 97 this morning.  I haven't checked it in a long time.  BP this morning 155/92.    ? ?I went to walk in clinic Sunday morning due to my COPD flaring up.   I was given prednisone and an antibiotic.    I was coming up porch steps later that day and I slipped and hit my head on concrete.   Ambulance took me to the ED.   I have a brain bleed.   They did 2 CT scans.  Bleed had not gotten better.    They let me go home. ?I slept Monday and Tuesday in recliner.   Last night first time sleep in my bed.   I fell backwards on my bed during the night because I was so dizzy. ?3:00 AM  167 glucose.    BP 195/106 Pulse 80 at 3:00 AM after I got up so dizzy.   ? ?I can get up and move around fine now.  I was fine after sitting at the kitchen table during the night.    After laying flat is when I get dizzy. ? ?Pt is yawning a lot while talking with him.    He said he slept real good last night.    Denies headache, blurred vision, not dizzy now.   I encouraged him to not lay flat on his back since that is when he has dizziness when getting up.   He denies feeling sleepy even though he is yawning.    ?3. VERTIGO: "Do you feel like either you or the room is spinning or tilting?" (i.e. vertigo) ?    *No Answer* ?4. SEVERITY: "How bad is it?"   "Do you feel like you are going to faint?" "Can you stand and walk?" ?  - MILD: Feels slightly dizzy, but walking normally. ?  - MODERATE: Feels unsteady when walking, but not falling; interferes with normal activities (e.g., school, work). ?  - SEVERE: Unable to walk without falling, or requires assistance to walk without falling; feels like passing out now.  ?    *No Answer* ?5. ONSET:  "When did the dizziness begin?" ?    *No Answer* ?6. AGGRAVATING FACTORS: "Does anything make it worse?" (e.g., standing, change in head position) ?    *No Answer* ?7. HEART RATE: "Can you tell me your heart rate?" "How many beats in 15 seconds?"  (Note: not all patients can do this)   ?    *No Answer* ?8. CAUSE: "What do you think is causing the dizziness?" ?    *No Answer* ?9. RECURRENT SYMPTOM: "Have you had dizziness before?" If Yes, ask: "When was the last time?" "What  happened that time?" ?    *No Answer* ?10. OTHER SYMPTOMS: "Do you have any other symptoms?" (e.g., fever, chest pain, vomiting, diarrhea, bleeding) ?      *No Answer* ?11. PREGNANCY: "Is there any chance you are pregnant?" "When was your last menstrual period?" ?      *No Answer* ? ?Protocols used: Dizziness - Lightheadedness-A-AH ? ?

## 2022-02-02 NOTE — Telephone Encounter (Addendum)
?  Chief Complaint: Dizziness after laying flat on his back only, when he gets up.   Happened last night 3:00 AM and again this morning.  He fell backwards onto his bed this morning due to being dizzy.  He denies being dizzy, having a headache, blurred vision or having trouble ambulating now.  "I'm fine right now it's just after I've been laying flat on my back". ?Symptoms: dizziness causing him to fall over backwards on 2 occasions after getting up from laying flat on his back. ?Frequency: Last night 3:00 AM and again this morning when he got up. ?(He has a brain bleed from falling down some steps and hitting his head on concrete.  See ED note) ?Pertinent Negatives: Patient denies dizziness, headache, blurred vision, trouble ambulating now. ?Disposition: '[]'$ ED /'[]'$ Urgent Care (no appt availability in office) / '[x]'$ Appointment(In office/virtual)/ '[]'$  [facility]/ '[]'$ Home Care/ '[]'$ Refused Recommended Disposition /'[]'$  Mobile Bus/ '[]'$  Follow-up with PCP ?Additional Notes: Has a hospital f/u appt in the morning at 10:20.   I went over the s/s to call 911.   He verbalized understanding and was agreeable to calling 911 if any of the symptoms occurred.   ? ?Notes sent to Dr. Parks Ranger for his information for tomorrow's appt.  I also called into the office and spoke with Apolonio Schneiders asking for Dr. Parks Ranger to look over my notes to see if he should return to the ED or if it's ok for him to wait until appt tomorrow morning at 10:20.    ?

## 2022-02-02 NOTE — Telephone Encounter (Signed)
I called patient briefly, he is okay to keep apt tomorrow. No further return to ED or other intervention at this time. Seems symptoms only positional and provoked with laying to sitting up quickly. ? ?[name], DO ?[facility] ?[facility] ?02/02/2022, 11:59 AM ? ?

## 2022-02-03 ENCOUNTER — Ambulatory Visit: Payer: BC Managed Care – PPO | Admitting: Family Medicine

## 2022-02-03 ENCOUNTER — Other Ambulatory Visit: Payer: Self-pay

## 2022-02-03 ENCOUNTER — Encounter: Payer: Self-pay | Admitting: Family Medicine

## 2022-02-03 VITALS — BP 156/67 | HR 85 | Ht 70.0 in | Wt 235.2 lb

## 2022-02-03 DIAGNOSIS — I10 Essential (primary) hypertension: Secondary | ICD-10-CM

## 2022-02-03 DIAGNOSIS — H8113 Benign paroxysmal vertigo, bilateral: Secondary | ICD-10-CM

## 2022-02-03 DIAGNOSIS — E1142 Type 2 diabetes mellitus with diabetic polyneuropathy: Secondary | ICD-10-CM | POA: Diagnosis not present

## 2022-02-03 DIAGNOSIS — R42 Dizziness and giddiness: Secondary | ICD-10-CM | POA: Diagnosis not present

## 2022-02-03 NOTE — Progress Notes (Signed)
Subjective:    Patient ID: Henry Horne, male    DOB: 1951-04-10, 71 y.o.   MRN: [id number]  Henry Horne is a 71 y.o. male presenting on 02/03/2022 for Hospitalization Follow-up   HPI  ED FOLLOW-UP VISIT  Hospital/Location: [place] Date of ED Visit: 01/30/22  Reason for Presenting to ED: Fall Injury, COPD, Subdural Hematoma  FOLLOW-UP - ED provider note and record have been reviewed - Patient presents today about 4 days after recent ED visit. Brief summary of recent course, patient had symptoms of pre syncopal episode fall, presented to ED on 01/30/22, testing in ED with imaging CT identified subdural hematoma, treated with extensive work up and no CVA identified, discharge home w/ neurosurgery follow up  Note prior to ED visit he was treated at urgent care w/ prednisone.  - Today reports overall has done well after discharge from ED. Symptoms of near syncope have resolved But still has dizziness and labile BP  Home BP readings 170-190 / 106, had some higher sugars. Next day Mon/Tues had BP 170s Last night wake up at 3am, BP 177/93, Pulse 70, Glucose 125, 30 minutes later 166/92, 45 min later 160/78, and glucose 129, HR 69.  In bed laying 167/70, then stand up 147/85, then BP 156/95  Blood sugar is variable lately due to prednisone, and causing significant acute raising at times up to 300+ Usually sugar is more normal in morning.  Also Blood pressure seems to be more variable due to Prednisone.  I have reviewed the discharge medication list, and have reconciled the current and discharge medications today.   Depression screen [facility] 2/9 03/25/2021 09/24/2020 01/29/2020  Decreased Interest 0 0 1  Down, Depressed, Hopeless 2 0 1  PHQ - 2 Score 2 0 2  Altered sleeping 1 - 2  Tired, decreased energy 2 - 2  Change in appetite 0 - 1  Feeling bad or failure about yourself  1 - 1  Trouble concentrating 0 - 1  Moving slowly or fidgety/restless 0 - 1  Suicidal thoughts 0 - 1  PHQ-9  Score 6 - 11  Difficult doing work/chores Not difficult at all - Somewhat difficult  Some recent data might be hidden    Social History   Tobacco Use   Smoking status: Former    Years: 30.00    Types: Cigarettes    Quit date: 05/18/2004    Years since quitting: 17.7   Smokeless tobacco: Former  Scientific laboratory technician Use: Never used  Substance Use Topics   Alcohol use: No    Alcohol/week: 0.0 standard drinks   Drug use: No    Review of Systems Per HPI unless specifically indicated above     Objective:    BP (!) 156/67    Pulse 85    Ht '5\' 10"'$  (1.778 m)    Wt 235 lb 3.2 oz (106.7 kg)    SpO2 99%    BMI 33.75 kg/m   Wt Readings from Last 3 Encounters:  02/03/22 235 lb 3.2 oz (106.7 kg)  01/30/22 230 lb (104.3 kg)  11/04/21 237 lb 3.2 oz (107.6 kg)    Physical Exam Vitals and nursing note reviewed.  Constitutional:      General: He is not in acute distress.    Appearance: Normal appearance. He is well-developed. He is not diaphoretic.     Comments: Well-appearing, comfortable, cooperative  HENT:     Head: Normocephalic and atraumatic.  Eyes:  General:        Right eye: No discharge.        Left eye: No discharge.     Conjunctiva/sclera: Conjunctivae normal.  Neck:     Thyroid: No thyromegaly.  Cardiovascular:     Rate and Rhythm: Normal rate and regular rhythm.     Pulses: Normal pulses.     Heart sounds: Normal heart sounds. No murmur heard. Pulmonary:     Effort: Pulmonary effort is normal. No respiratory distress.     Breath sounds: Normal breath sounds. No wheezing or rales.  Musculoskeletal:        General: Normal range of motion.     Cervical back: Normal range of motion and neck supple.  Lymphadenopathy:     Cervical: No cervical adenopathy.  Skin:    General: Skin is warm and dry.     Findings: No erythema or rash.  Neurological:     Mental Status: He is alert and oriented to person, place, and time. Mental status is at baseline.  Psychiatric:         Mood and Affect: Mood normal.        Behavior: Behavior normal.        Thought Content: Thought content normal.     Comments: Well groomed, good eye contact, normal speech and thoughts      I have personally reviewed the radiology report from 01/30/22 on Head CT.  CT Head Wo ContrastPerformed 01/30/2022 Final result  Study Result CLINICAL DATA: Fall striking posterior head. Laceration.  EXAM: CT HEAD WITHOUT CONTRAST  TECHNIQUE: Contiguous axial images were obtained from the base of the skull through the vertex without intravenous contrast.  RADIATION DOSE REDUCTION: This exam was performed according to the departmental dose-optimization program which includes automated exposure control, adjustment of the mA and/or kV according to patient size and/or use of iterative reconstruction technique.  COMPARISON: None.  FINDINGS: Brain: Small volume of acute subdural blood along the anterior aspect of the falx, measuring up to 6 mm in thickness. No epidural, intraparenchymal, or intraventricular blood. No evidence of acute infarct, hydrocephalus, mass lesion or mass effect. Age related atrophy with moderate periventricular chronic small vessel ischemia.  Vascular: Atherosclerosis of skullbase vasculature without hyperdense vessel or abnormal calcification.  Skull: No fracture or focal lesion.  Sinuses/Orbits: No visualized facial bone fracture there is mucosal thickening involving the ethmoid air cells with small fluid levels in the maxillary sinuses. Mucosal thickening/mucous retention cyst in the left sphenoid sinus. Opacification of the right frontal sinus appears chronic.  Other: Left parietal scalp hematoma.  IMPRESSION: 1. Small volume of acute subdural blood along the anterior aspect of the falx, measuring up to 6 mm in thickness. 2. Left parietal scalp hematoma. No skull fracture. 3. Age related atrophy and chronic small vessel ischemia.  Critical  Value/emergent results were called by telephone at the time of interpretation on 01/30/2022 at 10:19 pm to provider [facility] , who verbally acknowledged these results.   Electronically Signed By: Keith Rake M.D. On: 01/30/2022 22:20    Results for orders placed or performed during the hospital encounter of 01/30/22  Resp Panel by RT-PCR (Flu A&B, Covid)   Specimen: Nasopharyngeal(NP) swabs in vial transport medium  Result Value Ref Range   SARS Coronavirus 2 by RT PCR NEGATIVE NEGATIVE   Influenza A by PCR NEGATIVE NEGATIVE   Influenza B by PCR NEGATIVE NEGATIVE  Basic metabolic panel  Result Value Ref Range   Sodium 130 (L)  135 - 145 mmol/L   Potassium 4.4 3.5 - 5.1 mmol/L   Chloride 101 98 - 111 mmol/L   CO2 20 (L) 22 - 32 mmol/L   Glucose, Bld 336 (H) 70 - 99 mg/dL   BUN 15 8 - 23 mg/dL   Creatinine, Ser 1.21 0.61 - 1.24 mg/dL   Calcium 8.4 (L) 8.9 - 10.3 mg/dL   GFR, Estimated >60 >60 mL/min   Anion gap 9 5 - 15  CBC  Result Value Ref Range   WBC 15.0 (H) 4.0 - 10.5 K/uL   RBC 5.44 4.22 - 5.81 MIL/uL   Hemoglobin 13.9 13.0 - 17.0 g/dL   HCT 43.9 39.0 - 52.0 %   MCV 80.7 80.0 - 100.0 fL   MCH 25.6 (L) 26.0 - 34.0 pg   MCHC 31.7 30.0 - 36.0 g/dL   RDW 13.4 11.5 - 15.5 %   Platelets 236 150 - 400 K/uL   nRBC 0.0 0.0 - 0.2 %  Urinalysis, Complete w Microscopic  Result Value Ref Range   Color, Urine YELLOW (A) YELLOW   APPearance CLEAR (A) CLEAR   Specific Gravity, Urine 1.028 1.005 - 1.030   pH 6.0 5.0 - 8.0   Glucose, UA >=500 (A) NEGATIVE mg/dL   Hgb urine dipstick SMALL (A) NEGATIVE   Bilirubin Urine NEGATIVE NEGATIVE   Ketones, ur 20 (A) NEGATIVE mg/dL   Protein, ur 30 (A) NEGATIVE mg/dL   Nitrite NEGATIVE NEGATIVE   Leukocytes,Ua NEGATIVE NEGATIVE   RBC / HPF 6-10 0 - 5 RBC/hpf   WBC, UA 0-5 0 - 5 WBC/hpf   Bacteria, UA NONE SEEN NONE SEEN   Squamous Epithelial / LPF NONE SEEN 0 - 5   Mucus PRESENT   Troponin I (High Sensitivity)  Result  Value Ref Range   Troponin I (High Sensitivity) 6 <18 ng/L  Troponin I (High Sensitivity)  Result Value Ref Range   Troponin I (High Sensitivity) 6 <18 ng/L      Assessment & Plan:   Problem List Items Addressed This Visit     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  BPPV Vertigo Postural Dizziness Gradually improving, discussed side effects of meds w/ prednisone Emphasis on using caution and slowing laying to sit to stand times Home epley maneuver  Possible TIA / Pre Syncopal Episode Reviewed CT and work up in hospital ED He has upcoming Neurosurgery f/u and CT as scheduled If further concerns or if they prefer we can pursue Neurology consult w/ MRI  T2DM Elevated sugars due to prednisone.  HTN See above, response to prednisone  Absence from work / [employer]  Will re-evaluate patient next week Today we extended his absence from remaining out 01/30/22 through 02/13/22, as he is not medically ready to return yet due to near syncopal episode and dizziness, vertigo, labile blood pressure.  He will return in 1 week to follow-up, complete rest of paperwork to determine leave of absence and return to work date form.  Follow up plan: Return in about 1 week (around 02/10/2022) for 1 week for re-eval for return to work forms FMLA.   [name], [employer] 02/03/2022, 10:36 AM

## 2022-02-03 NOTE — Patient Instructions (Addendum)
Thank you for coming to the office today. ? ?1. You have symptoms of Vertigo (Benign Paroxysmal Positional Vertigo) ?- This is commonly caused by inner ear fluid imbalance, sometimes can be worsened by allergies and sinus symptoms, otherwise it can occur randomly sometimes and we may never discover the exact cause. ?- To treat this, try the Epley Manuever (see diagrams/instructions below) at home up to 3 times a day for 1-2 weeks or until symptoms resolve ?- You may take Meclizine as needed up to 3 times a day for dizziness, this will not cure symptoms but may help. Caution may make you drowsy. ? ?If you develop significant worsening episode with vertigo that does not improve and you get severe headache, loss of vision, arm or leg weakness, slurred speech, or other concerning symptoms please seek immediate medical attention at Emergency Department. ? ?Please schedule a follow-up appointment with Dr Parks Ranger within 4 weeks if Vertigo not improving, and will consider Referral to Vestibular Rehab ? ?See the next page for images describing the [place]. ? ? ? ? ?---------------------------------------------------------------------------------------------------------------------- ? ? ? ? ? ? ? ? ?Please schedule a Follow-up Appointment to: Return in about 1 week (around 02/10/2022) for 1 week for re-eval for return to work forms FMLA. ? ?If you have any other questions or concerns, please feel free to call the office or send a message through [place]. You may also schedule an earlier appointment if necessary. ? ?Additionally, you may be receiving a survey about your experience at our office within a few days to 1 week by e-mail or mail. We value your feedback. ? ?[name], DO ?[facility] ?

## 2022-02-10 ENCOUNTER — Encounter: Payer: Self-pay | Admitting: Family Medicine

## 2022-02-10 ENCOUNTER — Ambulatory Visit (INDEPENDENT_AMBULATORY_CARE_PROVIDER_SITE_OTHER): Payer: BC Managed Care – PPO | Admitting: Family Medicine

## 2022-02-10 ENCOUNTER — Other Ambulatory Visit: Payer: Self-pay

## 2022-02-10 VITALS — BP 132/88 | HR 120 | Ht 70.0 in | Wt 238.6 lb

## 2022-02-10 DIAGNOSIS — E1142 Type 2 diabetes mellitus with diabetic polyneuropathy: Secondary | ICD-10-CM | POA: Diagnosis not present

## 2022-02-10 DIAGNOSIS — I10 Essential (primary) hypertension: Secondary | ICD-10-CM

## 2022-02-10 DIAGNOSIS — J4520 Mild intermittent asthma, uncomplicated: Secondary | ICD-10-CM

## 2022-02-10 DIAGNOSIS — R55 Syncope and collapse: Secondary | ICD-10-CM

## 2022-02-10 DIAGNOSIS — S065XAA Traumatic subdural hemorrhage with loss of consciousness status unknown, initial encounter: Secondary | ICD-10-CM | POA: Diagnosis not present

## 2022-02-10 MED ORDER — ALBUTEROL SULFATE HFA 108 (90 BASE) MCG/ACT IN AERS: INHALATION_SPRAY | RESPIRATORY_TRACT | 0 refills | Status: DC

## 2022-02-10 NOTE — Addendum Note (Signed)
Addended by: Olin Hauser on: 02/10/2022 03:54 PM ? ? Modules accepted: Orders ? ?

## 2022-02-10 NOTE — Patient Instructions (Signed)
° °  Please schedule a Follow-up Appointment to: No follow-ups on file. ° °If you have any other questions or concerns, please feel free to call the office or send a message through MyChart. You may also schedule an earlier appointment if necessary. ° °Additionally, you may be receiving a survey about your experience at our office within a few days to 1 week by e-mail or mail. We value your feedback. ° °Tyquasia Pant, DO °[facility], [facility] °

## 2022-02-10 NOTE — Progress Notes (Addendum)
? ?Subjective:  ? ? Patient ID: Henry Horne, male    DOB: 11-Dec-1950, 71 y.o.   MRN: [id number] ? ?Henry Horne is a 71 y.o. male presenting on 02/10/2022 for Follow-up ? ? ?HPI ? ?HTN ?Syncopal Episode ?Subdural Hematoma ? ?Follow-up from last visit 3./9/23, reviewed records, he has been to Emergency Dept on 01/30/22 due to syncopal episode fall injury. ? ?He was on prednisone recently for acute COPD flare. He has had reaction to elevated sugars and abnormal fluctuating blood pressure as a result. ? ?He has not had any further pre syncope or syncopal episodes, no further dizziness and unsteadiness. He has occasional headache. He has upcoming Neurosurgery visit in about 3 weeks for CT Head injury and one week later for Neurosurgery consult. ? ?Now significant improved 1 week more remaining off prednisone. ?Improved BP range, now has normalized 130-140 / 80s ?- No further falls, unsteadiness, dizziness ?- He has resumed driving ?- Admits rare headache ? ?CBG readings now seem improved 150-270, avg 160-225, low 122 ? ?Neurosurgery CT 4/4 then apt 4/11 ? ? ?He feels ready to return to work next week. ? ?Depression screen [facility] 2/9 03/25/2021 09/24/2020 01/29/2020  ?Decreased Interest 0 0 1  ?Down, Depressed, Hopeless 2 0 1  ?PHQ - 2 Score 2 0 2  ?Altered sleeping 1 - 2  ?Tired, decreased energy 2 - 2  ?Change in appetite 0 - 1  ?Feeling bad or failure about yourself  1 - 1  ?Trouble concentrating 0 - 1  ?Moving slowly or fidgety/restless 0 - 1  ?Suicidal thoughts 0 - 1  ?PHQ-9 Score 6 - 11  ?Difficult doing work/chores Not difficult at all - Somewhat difficult  ?Some recent data might be hidden  ? ? ?Social History  ? ?Tobacco Use  ? Smoking status: Former  ?  Years: 30.00  ?  Types: Cigarettes  ?  Quit date: 05/18/2004  ?  Years since quitting: 17.7  ? Smokeless tobacco: Former  ?Vaping Use  ? Vaping Use: Never used  ?Substance Use Topics  ? Alcohol use: No  ?  Alcohol/week: 0.0 standard drinks  ? Drug use: No   ? ? ?Review of Systems ?Per HPI unless specifically indicated above ? ?   ?Objective:  ?  ?BP 132/88   Pulse (!) 120   Ht '5\' 10"'$  (1.778 m)   Wt 238 lb 9.6 oz (108.2 kg)   SpO2 99%   BMI 34.24 kg/m?   ?Wt Readings from Last 3 Encounters:  ?02/10/22 238 lb 9.6 oz (108.2 kg)  ?02/03/22 235 lb 3.2 oz (106.7 kg)  ?01/30/22 230 lb (104.3 kg)  ?  ?Physical Exam ?Vitals and nursing note reviewed.  ?Constitutional:   ?   General: He is not in acute distress. ?   Appearance: He is well-developed. He is not diaphoretic.  ?   Comments: Well-appearing, comfortable, cooperative  ?HENT:  ?   Head: Normocephalic and atraumatic.  ?Eyes:  ?   General:     ?   Right eye: No discharge.     ?   Left eye: No discharge.  ?   Conjunctiva/sclera: Conjunctivae normal.  ?Neck:  ?   Thyroid: No thyromegaly.  ?Cardiovascular:  ?   Rate and Rhythm: Normal rate and regular rhythm.  ?   Pulses: Normal pulses.  ?   Heart sounds: Normal heart sounds. No murmur heard. ?Pulmonary:  ?   Effort: Pulmonary effort is normal. No respiratory distress.  ?  Breath sounds: Normal breath sounds. No wheezing or rales.  ?Musculoskeletal:     ?   General: Normal range of motion.  ?   Cervical back: Normal range of motion and neck supple.  ?Lymphadenopathy:  ?   Cervical: No cervical adenopathy.  ?Skin: ?   General: Skin is warm and dry.  ?   Findings: No erythema or rash.  ?Neurological:  ?   Mental Status: He is alert and oriented to person, place, and time. Mental status is at baseline.  ?Psychiatric:     ?   Behavior: Behavior normal.  ?   Comments: Well groomed, good eye contact, normal speech and thoughts  ? ? ? ?Results for orders placed or performed during the hospital encounter of 01/30/22  ?Resp Panel by RT-PCR (Flu A&B, Covid)  ? Specimen: Nasopharyngeal(NP) swabs in vial transport medium  ?Result Value Ref Range  ? SARS Coronavirus 2 by RT PCR NEGATIVE NEGATIVE  ? Influenza A by PCR NEGATIVE NEGATIVE  ? Influenza B by PCR NEGATIVE NEGATIVE   ?Basic metabolic panel  ?Result Value Ref Range  ? Sodium 130 (L) 135 - 145 mmol/L  ? Potassium 4.4 3.5 - 5.1 mmol/L  ? Chloride 101 98 - 111 mmol/L  ? CO2 20 (L) 22 - 32 mmol/L  ? Glucose, Bld 336 (H) 70 - 99 mg/dL  ? BUN 15 8 - 23 mg/dL  ? Creatinine, Ser 1.21 0.61 - 1.24 mg/dL  ? Calcium 8.4 (L) 8.9 - 10.3 mg/dL  ? GFR, Estimated >60 >60 mL/min  ? Anion gap 9 5 - 15  ?CBC  ?Result Value Ref Range  ? WBC 15.0 (H) 4.0 - 10.5 K/uL  ? RBC 5.44 4.22 - 5.81 MIL/uL  ? Hemoglobin 13.9 13.0 - 17.0 g/dL  ? HCT 43.9 39.0 - 52.0 %  ? MCV 80.7 80.0 - 100.0 fL  ? MCH 25.6 (L) 26.0 - 34.0 pg  ? MCHC 31.7 30.0 - 36.0 g/dL  ? RDW 13.4 11.5 - 15.5 %  ? Platelets 236 150 - 400 K/uL  ? nRBC 0.0 0.0 - 0.2 %  ?Urinalysis, Complete w Microscopic  ?Result Value Ref Range  ? Color, Urine YELLOW (A) YELLOW  ? APPearance CLEAR (A) CLEAR  ? Specific Gravity, Urine 1.028 1.005 - 1.030  ? pH 6.0 5.0 - 8.0  ? Glucose, UA >=500 (A) NEGATIVE mg/dL  ? Hgb urine dipstick SMALL (A) NEGATIVE  ? Bilirubin Urine NEGATIVE NEGATIVE  ? Ketones, ur 20 (A) NEGATIVE mg/dL  ? Protein, ur 30 (A) NEGATIVE mg/dL  ? Nitrite NEGATIVE NEGATIVE  ? Leukocytes,Ua NEGATIVE NEGATIVE  ? RBC / HPF 6-10 0 - 5 RBC/hpf  ? WBC, UA 0-5 0 - 5 WBC/hpf  ? Bacteria, UA NONE SEEN NONE SEEN  ? Squamous Epithelial / LPF NONE SEEN 0 - 5  ? Mucus PRESENT   ?Troponin I (High Sensitivity)  ?Result Value Ref Range  ? Troponin I (High Sensitivity) 6 <18 ng/L  ?Troponin I (High Sensitivity)  ?Result Value Ref Range  ? Troponin I (High Sensitivity) 6 <18 ng/L  ? ?   ?Assessment & Plan:  ? ?Problem List Items Addressed This Visit   ? ? Type 2 diabetes mellitus with peripheral neuropathy (HCC)  ? Essential hypertension - Primary  ? Asthma  ? Relevant Medications  ? albuterol (VENTOLIN HFA) 108 (90 Base) MCG/ACT inhaler  ? ?Other Visit Diagnoses   ? ? Subdural hematoma      ? Syncope  and collapse      ? ?  ? ?  ?BPPV Vertigo - resolved ?Postural Dizziness - resolved ? ?Improving since off  Prednisone now. ?  ?Possible TIA / Pre Syncopal Episode - No further episode ?Reviewed CT and work up in hospital ED ?He has upcoming Neurosurgery f/u and CT as scheduled 4/4 and 4/11 ? ?I advised them to discuss future MRI options and can refer to [facility] Neurology next after his CT ?  ?Diabetes ?Improving CBG control now off prednisone ?  ?HTN ?Improving BP ranges now off Prednisone ?  ?Absence from work / Event organiser ?Form completed, signed, to be faxed on 02/11/22 ?Absence from 01/30/22 through 02/13/22 ?Return to work date anticipated 02/14/22 with no restrictions ? ?Meds ordered this encounter  ?Medications  ? albuterol (VENTOLIN HFA) 108 (90 Base) MCG/ACT inhaler  ?  Sig: INHALE 1 TO 2 PUFFS BY MOUTH EVERY 4 HOURS AS NEEDED FOR WHEEZING OR SHORTNESS OF BREATH  ?  Dispense:  9 g  ?  Refill:  0  ? ? ? ? ?Follow up plan: ?Return if symptoms worsen or fail to improve. ? ? ? ?[name], DO ?[facility] ?[facility] ?02/10/2022, 11:07 AM ?

## 2022-02-13 ENCOUNTER — Other Ambulatory Visit: Payer: Self-pay | Admitting: Family Medicine

## 2022-02-15 NOTE — Telephone Encounter (Signed)
Requested Prescriptions  ?Pending Prescriptions Disp Refills  ?? Continuous Blood Gluc Sensor (FREESTYLE LIBRE 14 DAY SENSOR) MISC [Pharmacy Med Name: FREESTYLE LIBRE SENSOR 14D KIT] 2 each 2  ?  Sig: USE TO CHECK BLOOD SUGAR AS NEEDED EVERY 14 DAYS  ?  ? Endocrinology: Diabetes - Testing Supplies Passed - 02/13/2022  2:51 PM  ?  ?  Passed - Valid encounter within last 12 months  ?  Recent Outpatient Visits   ?      ? 5 days ago Essential hypertension  ? [place], DO  ? 1 week ago Type 2 diabetes mellitus with peripheral neuropathy ([place])  ? [place], DO  ? 3 months ago Type 2 diabetes mellitus with peripheral neuropathy ([place])  ? [place], DO  ? 10 months ago Annual physical exam  ? [place], DO  ? 1 year ago Epidermoid cyst of skin of back  ? [place], DO  ?  ?  ?Future Appointments   ?        ? In 1 month Karamalegos, Devonne Doughty, DO [facility], [place]  ?  ? ?  ?  ?  ? ? ?

## 2022-03-01 ENCOUNTER — Ambulatory Visit
Admission: RE | Admit: 2022-03-01 | Discharge: 2022-03-01 | Disposition: A | Discharge status: Home / Self Care | Payer: BC Managed Care – PPO | Source: Ambulatory Visit | Attending: Neurosurgery | Admitting: Neurosurgery

## 2022-03-01 DIAGNOSIS — S065XAA Traumatic subdural hemorrhage with loss of consciousness status unknown, initial encounter: Secondary | ICD-10-CM | POA: Insufficient documentation

## 2022-03-01 DIAGNOSIS — R42 Dizziness and giddiness: Secondary | ICD-10-CM | POA: Diagnosis not present

## 2022-03-08 DIAGNOSIS — S065XAA Traumatic subdural hemorrhage with loss of consciousness status unknown, initial encounter: Secondary | ICD-10-CM | POA: Diagnosis not present

## 2022-03-08 DIAGNOSIS — R42 Dizziness and giddiness: Secondary | ICD-10-CM | POA: Diagnosis not present

## 2022-03-16 ENCOUNTER — Other Ambulatory Visit: Payer: Self-pay | Admitting: Family Medicine

## 2022-03-16 NOTE — Telephone Encounter (Signed)
Requested medication (s) are due for refill today: yes ? ?Requested medication (s) are on the active medication list: yes ? ?Last refill:  07/25/18 ? ?Future visit scheduled: yes ? ?Notes to clinic:  Unable to refill per protocol due to last refill is 07/25/18, needs PCP review. ? ? ?  ?Requested Prescriptions  ?Pending Prescriptions Disp Refills  ? Continuous Blood Gluc Receiver (FREESTYLE LIBRE 21 DAY READER) [place] [Pharmacy Med Name: FREESTYLE LIBRE READER 14D MIS] 1 each 0  ?  Sig: USE 1 EACH EVERY 14 DAYS. USE TO CHECK BLOOD SUGAR.  ?  ? Endocrinology: Diabetes - Testing Supplies Passed - 03/16/2022 12:37 PM  ?  ?  Passed - Valid encounter within last 12 months  ?  Recent Outpatient Visits   ? ?      ? 1 month ago Essential hypertension  ? [place], DO  ? 1 month ago Type 2 diabetes mellitus with peripheral neuropathy ([place])  ? [place], DO  ? 4 months ago Type 2 diabetes mellitus with peripheral neuropathy ([place])  ? [place], DO  ? 11 months ago Annual physical exam  ? [place], DO  ? 1 year ago Epidermoid cyst of skin of back  ? [place], DO  ? ?  ?  ?Future Appointments   ? ?        ? In 3 weeks Parks Ranger, Devonne Doughty, DO [facility], [place]  ? ?  ? ? ?  ?  ?  ? ? ?

## 2022-03-20 ENCOUNTER — Other Ambulatory Visit: Payer: Self-pay | Admitting: Family Medicine

## 2022-03-22 NOTE — Telephone Encounter (Signed)
Requested Prescriptions  ?Pending Prescriptions Disp Refills  ?? insulin glargine (LANTUS) 100 UNIT/ML injection [Pharmacy Med Name: Lantus 100 UNIT/ML Subcutaneous Solution] 20 mL 0  ?  Sig: INJECT 70 UNITS SUBCUTANEOUSLY ONCE DAILY  ?  ? Endocrinology:  Diabetes - Insulins Failed - 03/20/2022  4:55 PM  ?  ?  Failed - HBA1C is between 0 and 7.9 and within 180 days  ?  Hemoglobin A1C  ?Date Value Ref Range Status  ?11/04/2021 8.3 (A) 4.0 - 5.6 % Final  ? ?Hgb A1c MFr Bld  ?Date Value Ref Range Status  ?03/18/2021 8.3 (H) <5.7 % of total Hgb Final  ?  Comment:  ?  For someone without known diabetes, a hemoglobin A1c ?value of 6.5% or greater indicates that they may have  ?diabetes and this should be confirmed with a follow-up  ?test. ?. ?For someone with known diabetes, a value <7% indicates  ?that their diabetes is well controlled and a value  ?greater than or equal to 7% indicates suboptimal  ?control. A1c targets should be individualized based on  ?duration of diabetes, age, comorbid conditions, and  ?other considerations. ?. ?Currently, no consensus exists regarding use of ?hemoglobin A1c for diagnosis of diabetes for children. ?. ?  ?   ?  ?  Passed - Valid encounter within last 6 months  ?  Recent Outpatient Visits   ?      ? 1 month ago Essential hypertension  ? [place], DO  ? 1 month ago Type 2 diabetes mellitus with peripheral neuropathy ([place])  ? [place], DO  ? 4 months ago Type 2 diabetes mellitus with peripheral neuropathy ([place])  ? [place], DO  ? 12 months ago Annual physical exam  ? [place], DO  ? 1 year ago Epidermoid cyst of skin of back  ? [place], DO  ?  ?  ?Future Appointments   ?        ? In 2 weeks Parks Ranger Devonne Doughty, DO [facility], [place]  ?  ? ?  ?  ?   ? ? ?

## 2022-03-31 ENCOUNTER — Other Ambulatory Visit: Payer: BC Managed Care – PPO

## 2022-03-31 DIAGNOSIS — E1169 Type 2 diabetes mellitus with other specified complication: Secondary | ICD-10-CM | POA: Diagnosis not present

## 2022-03-31 DIAGNOSIS — I10 Essential (primary) hypertension: Secondary | ICD-10-CM

## 2022-03-31 DIAGNOSIS — C61 Malignant neoplasm of prostate: Secondary | ICD-10-CM | POA: Diagnosis not present

## 2022-03-31 DIAGNOSIS — E785 Hyperlipidemia, unspecified: Secondary | ICD-10-CM | POA: Diagnosis not present

## 2022-03-31 DIAGNOSIS — Z0000 Encounter for general adult medical examination without abnormal findings: Secondary | ICD-10-CM

## 2022-03-31 DIAGNOSIS — E1142 Type 2 diabetes mellitus with diabetic polyneuropathy: Secondary | ICD-10-CM | POA: Diagnosis not present

## 2022-03-31 DIAGNOSIS — M159 Polyosteoarthritis, unspecified: Secondary | ICD-10-CM

## 2022-04-01 LAB — COMPLETE METABOLIC PANEL WITH GFR
AG Ratio: 1.7 (calc) (ref 1.0–2.5)
ALT: 24 U/L (ref 9–46)
AST: 15 U/L (ref 10–35)
Albumin: 4 g/dL (ref 3.6–5.1)
Alkaline phosphatase (APISO): 77 U/L (ref 35–144)
BUN: 18 mg/dL (ref 7–25)
CO2: 27 mmol/L (ref 20–32)
Calcium: 9.2 mg/dL (ref 8.6–10.3)
Chloride: 102 mmol/L (ref 98–110)
Creat: 1.05 mg/dL (ref 0.70–1.28)
Globulin: 2.3 g/dL (calc) (ref 1.9–3.7)
Glucose, Bld: 167 mg/dL — ABNORMAL HIGH (ref 65–99)
Potassium: 4.5 mmol/L (ref 3.5–5.3)
Sodium: 138 mmol/L (ref 135–146)
Total Bilirubin: 0.5 mg/dL (ref 0.2–1.2)
Total Protein: 6.3 g/dL (ref 6.1–8.1)
eGFR: 76 mL/min/{1.73_m2} (ref >=60)

## 2022-04-01 LAB — CBC WITH DIFFERENTIAL/PLATELET
Absolute Monocytes: 784 cells/uL (ref 200–950)
Basophils Absolute: 80 cells/uL (ref 0–200)
Basophils Relative: 1.2 %
Eosinophils Absolute: 268 cells/uL (ref 15–500)
Eosinophils Relative: 4 %
HCT: 42.9 % (ref 38.5–50.0)
Hemoglobin: 13.5 g/dL (ref 13.2–17.1)
Lymphs Abs: 1494 cells/uL (ref 850–3900)
MCH: 25.9 pg — ABNORMAL LOW (ref 27.0–33.0)
MCHC: 31.5 g/dL — ABNORMAL LOW (ref 32.0–36.0)
MCV: 82.2 fL (ref 80.0–100.0)
MPV: 10.4 fL (ref 7.5–12.5)
Monocytes Relative: 11.7 %
Neutro Abs: 4074 cells/uL (ref 1500–7800)
Neutrophils Relative %: 60.8 %
Platelets: 255 10*3/uL (ref 140–400)
RBC: 5.22 10*6/uL (ref 4.20–5.80)
RDW: 14.2 % (ref 11.0–15.0)
Total Lymphocyte: 22.3 %
WBC: 6.7 10*3/uL (ref 3.8–10.8)

## 2022-04-01 LAB — LIPID PANEL
Cholesterol: 176 mg/dL (ref <200)
HDL: 34 mg/dL — ABNORMAL LOW (ref >=40)
LDL Cholesterol (Calc): 117 mg/dL (calc) — ABNORMAL HIGH
Non-HDL Cholesterol (Calc): 142 mg/dL (calc) — ABNORMAL HIGH (ref <130)
Total CHOL/HDL Ratio: 5.2 (calc) — ABNORMAL HIGH (ref <5.0)
Triglycerides: 133 mg/dL (ref <150)

## 2022-04-01 LAB — HEMOGLOBIN A1C
Hgb A1c MFr Bld: 8.9 % of total Hgb — ABNORMAL HIGH (ref <5.7)
Mean Plasma Glucose: 209 mg/dL
eAG (mmol/L): 11.6 mmol/L

## 2022-04-01 LAB — PSA: PSA: 0.06 ng/mL (ref <=4.00)

## 2022-04-08 ENCOUNTER — Ambulatory Visit (INDEPENDENT_AMBULATORY_CARE_PROVIDER_SITE_OTHER): Payer: BC Managed Care – PPO | Admitting: Family Medicine

## 2022-04-08 ENCOUNTER — Encounter: Payer: Self-pay | Admitting: Family Medicine

## 2022-04-08 VITALS — BP 122/78 | HR 77 | Ht 70.0 in | Wt 242.4 lb

## 2022-04-08 DIAGNOSIS — R42 Dizziness and giddiness: Secondary | ICD-10-CM

## 2022-04-08 DIAGNOSIS — Z0000 Encounter for general adult medical examination without abnormal findings: Secondary | ICD-10-CM | POA: Diagnosis not present

## 2022-04-08 DIAGNOSIS — R1310 Dysphagia, unspecified: Secondary | ICD-10-CM

## 2022-04-08 DIAGNOSIS — Z87898 Personal history of other specified conditions: Secondary | ICD-10-CM

## 2022-04-08 DIAGNOSIS — E1142 Type 2 diabetes mellitus with diabetic polyneuropathy: Secondary | ICD-10-CM | POA: Diagnosis not present

## 2022-04-08 DIAGNOSIS — R2689 Other abnormalities of gait and mobility: Secondary | ICD-10-CM | POA: Diagnosis not present

## 2022-04-08 DIAGNOSIS — I10 Essential (primary) hypertension: Secondary | ICD-10-CM

## 2022-04-08 DIAGNOSIS — S065XAA Traumatic subdural hemorrhage with loss of consciousness status unknown, initial encounter: Secondary | ICD-10-CM

## 2022-04-08 MED ORDER — MOUNJARO 5 MG/0.5ML ~~LOC~~ SOAJ: 5.0000 mg | SUBCUTANEOUS | 0 refills | Status: DC

## 2022-04-08 NOTE — Assessment & Plan Note (Signed)
Improved BP, question if variable readings, has had higher BP labile ?- Home BP readings limited readings  ?No known complications  ? ? Plan:  ?1. Continue current BP regimen - Losartan '50mg'$  daily, Amodipine '5mg'$  daily ?2. Encourage improved lifestyle - low sodium diet, improve regular exercise ?3.  monitor BP outside office, bring readings to next visit, if persistently >140/90 or new symptoms notify office sooner ?

## 2022-04-08 NOTE — Progress Notes (Signed)
? ?Subjective:  ? ? Patient ID: Henry Horne, male    DOB: 1951-04-17, 71 y.o.   MRN: [id number] ? ?Henry Horne is a 71 y.o. male presenting on 04/08/2022 for Annual Exam ? ? ?HPI ? ?Here for Annual Physical and Lab Review ? ?Additional updates ? ?History of dizziness / syncope ? ?Subdural Hematoma ?Neurosurgery followed up on the referred him to ENT for concern of vertigo ?Worried about Orthostatic Hypotension, laying down 160/80, stand up 130/60 ?Not dizzy or nausea rest of the day ?Seems to be reproduced only with bending forward and getting up to standing from laying down. After few minutes it can resolve. ?Choking and coughing - dysphagia ? ?Avg BP 170-185 ?Today BP down to 120 ? ?HYPERLIPIDEMIA: ?Last lipid panel, controlled 2023 ?On Statin ? 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 ?Meds: Lantus 21H morning, Trulicity '3mg'$  weekly [facility] (increased from previous visit 1.5), Metformin '1000mg'$  BID ?Reports good compliance. Tolerating well w/o side-effects ?Currently on ARB ?Lifestyle: ?- Diet (Improving diet overall, limiting sugars/carbs - admits poor diet often, more sweets) ?- Exercise (limited regular exercise, knee chronic joint pain) ?- Last DM Eye exam at [facility] in [place] ?- Prior history of nerve testing L>R reduced sensation ?Admits some numbness tingling feet ?Denies hypoglycemia ? ?Postprandial sugar 200-300 yesterday. ?DexCom CGM ?Random CBG - 222, 176, 134, 257, 209, 244, 225, 373 ?CBG right now 289 (last meal 2 hours ago) ? ?Urology Dr Jacqlyn Larsen in [facility] - did cystoscopy. ?PSA 0.06 ? ?Chronic Pain / osteoarthritis multiple joints ?Doing well on Tramadol '50mg'$  daily PRN recently worse with back and shoulders ? ?Health Maintenance: ? ?Colon CA Screening: Cologuard negative 03/2021, repeat 3 yrs ? ? ?   04/08/2022  ? 10:36 AM 03/25/2021  ?  8:23 AM 09/24/2020  ?  9:13 AM  ?Depression screen PHQ 2/9  ?Decreased Interest 0 0 0  ?Down, Depressed, Hopeless 1 2 0  ?PHQ - 2 Score 1 2 0  ?Altered sleeping 0 1   ?Tired, decreased energy 1 2   ?Change in appetite 0 0   ?Feeling bad or failure about yourself  0 1   ?Trouble concentrating 0 0   ?Moving slowly or fidgety/restless 0 0   ?Suicidal thoughts 0 0   ?PHQ-9 Score 2 6   ?Difficult doing work/chores Not difficult at all Not difficult at all   ? ? ?Past Medical History:  ?Diagnosis Date  ? Anxiety   ? Cancer of kidney ([place]) 2010  ? cystoscopy 2010, 2014, 2015, 2016- MD q 6 months  ? Depression   ? Emphysema of lung ([place]) 1996  ? Hyperlipidemia   ? Hypertension   ? Joint pain   ? Prostate cancer [facility]) 2008  ? removed with cryotherapy  ? ?Past Surgical History:  ?Procedure Laterality Date  ? DIGIT NAIL REMOVAL Left 2016  ? left and right foot -toe nails  ? ?Social History  ? ?Socioeconomic History  ? Marital status: Married  ?  Spouse name: Butch Penny  ? Number of children: Not on file  ? Years of education: Not on file  ? Highest education level: Not on file  ?Occupational History  ? Not on file  ?Tobacco Use  ? Smoking status: Former  ?  Years:  30.00  ?  Types: Cigarettes  ?  Quit date: 05/18/2004  ?  Years since quitting: 17.9  ? Smokeless tobacco: Former  ?Vaping Use  ? Vaping Use: Never used  ?Substance and Sexual Activity  ? Alcohol use: No  ?  Alcohol/week: 0.0 standard drinks  ? Drug use: No  ? Sexual activity: Not on file  ?Other Topics Concern  ? Not on file  ?Social History Narrative  ? Not on file  ? ?Social Determinants of Health  ? ?Financial Resource Strain: Not on file  ?Food Insecurity: Not on file  ?Transportation Needs: Not on file  ?Physical Activity: Not on file  ?Stress: Not on file  ?Social Connections: Not on file  ?Intimate Partner Violence: Not on file  ? ?Family History  ?Problem Relation Age of Onset  ? Diabetes Mother   ? Heart disease Father   ?  Cancer Father   ? Hyperlipidemia Father   ? Hypertension Father   ? Diabetes Brother   ? COPD Brother   ? Autoimmune disease Brother   ? Heart disease Brother   ? Diabetes Brother   ? ?Current Outpatient Medications on File Prior to Visit  ?Medication Sig  ? albuterol (VENTOLIN HFA) 108 (90 Base) MCG/ACT inhaler INHALE 1 TO 2 PUFFS BY MOUTH EVERY 4 HOURS AS NEEDED FOR WHEEZING OR SHORTNESS OF BREATH  ? amLODipine (NORVASC) 5 MG tablet Take 1 tablet (5 mg total) by mouth daily.  ? Continuous Blood Gluc Receiver (FREESTYLE LIBRE 14 DAY READER) DEVI USE 1 EACH EVERY 14 DAYS. USE TO CHECK BLOOD SUGAR.  ? Continuous Blood Gluc Sensor (FREESTYLE LIBRE 14 DAY SENSOR) MISC USE TO CHECK BLOOD SUGAR AS NEEDED EVERY 14 DAYS  ? diclofenac sodium (VOLTAREN) 1 % GEL Apply 2 g topically 3 (three) times daily as needed. For knees  ? FLUoxetine (PROZAC) 40 MG capsule Take 1 capsule (40 mg total) by mouth daily.  ? fluticasone (FLONASE) 50 MCG/ACT nasal spray Place 2 sprays into both nostrils daily. Use for 4-6 weeks then stop and use seasonally or as needed.  ? Fluticasone-Salmeterol (ADVAIR) 250-50 MCG/DOSE AEPB Inhale 1 puff into the lungs 2 (two) times daily.  ? Garlic Oil 2 MG CAPS Take 1,000 mg by mouth 2 (two) times daily. Reported on 11/17/2015  ? glucose blood (TRUETEST TEST) test strip 1 each by Other route 2 (two) times daily. Use as instructed  ? imipramine (TOFRANIL) 10 MG tablet Take 1 tablet (10 mg total) by mouth at bedtime.  ? insulin glargine (LANTUS) 100 UNIT/ML injection INJECT 70 UNITS SUBCUTANEOUSLY ONCE DAILY  ? Insulin Pen Needle (NOVOFINE) 30G X 8 MM MISC Use 1 nweedle to inject Victoza once daily.  ? Insulin Pen Needle 31G X 5 MM MISC 1 each by Does not apply route 3 (three) times daily.  ? Insulin Pen Needle 31G X 8 MM MISC Use as directed  ? Insulin Syringe-Needle U-100 31G X 5/16" 1 ML MISC 100 each by Does not apply route 2 (two) times daily.  ? losartan (COZAAR) 50 MG tablet Take 1 tablet (50 mg total)  by mouth daily.  ? metFORMIN (GLUCOPHAGE) 1000 MG tablet Take 1 tablet (1,000 mg total) by mouth 2 (two) times daily with a meal.  ? omeprazole (PRILOSEC) 20 MG capsule Take 20 mg by mouth daily.  ? traMADol (ULTRAM) 50 MG tablet Take 1 tablet (50 mg total) by mouth every 8 (eight) hours as needed.  ? TRUEPLUS LANCETS 30G MISC  Use 1 lancet  to check Blood sugar twice daily  ? ?No current facility-administered medications on file prior to visit.  ? ? ?Review of Systems ?Per HPI unless specifically indicated above ? ? ?   ?Objective:  ?  ?BP 122/78   Pulse 77   Ht '5\' 10"'$  (1.778 m)   Wt 242 lb 6.4 oz (110 kg)   SpO2 100%   BMI 34.78 kg/m?   ?Wt Readings from Last 3 Encounters:  ?04/08/22 242 lb 6.4 oz (110 kg)  ?02/10/22 238 lb 9.6 oz (108.2 kg)  ?02/03/22 235 lb 3.2 oz (106.7 kg)  ?  ?Physical Exam ?Vitals and nursing note reviewed.  ?Constitutional:   ?   General: He is not in acute distress. ?   Appearance: He is well-developed. He is obese. He is not diaphoretic.  ?   Comments: Well-appearing, comfortable, cooperative  ?HENT:  ?   Head: Normocephalic and atraumatic.  ?Eyes:  ?   General:     ?   Right eye: No discharge.     ?   Left eye: No discharge.  ?   Conjunctiva/sclera: Conjunctivae normal.  ?   Pupils: Pupils are equal, round, and reactive to light.  ?Neck:  ?   Thyroid: No thyromegaly.  ?Cardiovascular:  ?   Rate and Rhythm: Normal rate and regular rhythm.  ?   Pulses: Normal pulses.  ?   Heart sounds: Normal heart sounds. No murmur heard. ?Pulmonary:  ?   Effort: Pulmonary effort is normal. No respiratory distress.  ?   Breath sounds: Normal breath sounds. No wheezing or rales.  ?Abdominal:  ?   General: Bowel sounds are normal. There is no distension.  ?   Palpations: Abdomen is soft. There is no mass.  ?   Tenderness: There is no abdominal tenderness.  ?Musculoskeletal:     ?   General: No tenderness. Normal range of motion.  ?   Cervical back: Normal range of motion and neck supple.  ?   Comments:  Upper / Lower Extremities: ?- Normal muscle tone, strength bilateral upper extremities 5/5, lower extremities 5/5  ?Lymphadenopathy:  ?   Cervical: No cervical adenopathy.  ?Skin: ?   General: Skin is warm and d

## 2022-04-08 NOTE — Assessment & Plan Note (Signed)
A1c 8.9, elevated ?No hypoglycemia ?Complications - peripheral neuropathy L>R ?Failed: Victoza (cost), Bydureon (local reaction, knot) - Tresiba (cost), Ozempic (ineffective), Contraindicated SGLT2 (bladder cancer) ? ?Plan:  ?Discontinue Trulicity '3mg'$  weekly - Will switch to [facility] '5mg'$  weekly, new order. copay card given, goal to titrate as indicated. ? ?- Continue Lantus 70u daily eventually goal to reduce dose ?- Continue Metformin '1000mg'$  BID ?2. Encourage improved lifestyle - low carb, low sugar diet, reduce portion size, continue improving regular exercise ?3. Check CBG , bring log to next visit for review ?- Continue Freestyle Libre ?DM Foot ?Request DM Eye ?4. Continue ARB - consider ASA, Statin in future again or alternative PSK9 inhib - he has declined due to statin intolerance ?5. Last updated DM [place] in [place] request copy ?

## 2022-04-08 NOTE — Patient Instructions (Addendum)
Thank you for coming to the office today. ? ?Referral sent to : ? ?[facility]   ?Address: [address] [place], [place] [address] ?Hours: 8AM-5PM ?Phone: [phone] ? ?[place], in [place] ? ?They should be able to order anything such as PT and MRI imaging here in [place]/. ? ?Can ask Neuro about the dysphagia swallowing, may be more related to muscular coordination with swallowing can be a muscle weakness. ? ?May need swallow study in future ? ?May have a component of the Orthostatic Hypotension however seems pressures are still somewhat labile and elevated, although good today. I don't know if this is best route to go first. ? ?Switch Trulicity '3mg'$  to [facility] '5mg'$  weekly. Copay card. 90 day and we can adjust dose before you pick up more. ? ?1 more fill on Tramadol then submit refill request ? ?Labs look good ? ?A1c is still in 8 range ? ?Recent Labs  ?  11/04/21 ?1012 03/31/22 ?0800  ?HGBA1C 8.3* 8.9*  ? ? ?Please schedule a Follow-up Appointment to: Return in about 3 months (around 07/09/2022) for 3 month DM A1c, Neuro updates. ? ?If you have any other questions or concerns, please feel free to call the office or send a message through [place]. You may also schedule an earlier appointment if necessary. ? ?Additionally, you may be receiving a survey about your experience at our office within a few days to 1 week by e-mail or mail. We value your feedback. ? ?[name], DO ?[place] ?

## 2022-04-14 DIAGNOSIS — H903 Sensorineural hearing loss, bilateral: Secondary | ICD-10-CM | POA: Diagnosis not present

## 2022-04-14 DIAGNOSIS — H8111 Benign paroxysmal vertigo, right ear: Secondary | ICD-10-CM | POA: Diagnosis not present

## 2022-04-16 ENCOUNTER — Other Ambulatory Visit: Payer: Self-pay | Admitting: Family Medicine

## 2022-04-16 DIAGNOSIS — H6983 Other specified disorders of Eustachian tube, bilateral: Secondary | ICD-10-CM

## 2022-04-19 NOTE — Telephone Encounter (Signed)
Requested Prescriptions  Pending Prescriptions Disp Refills  . fluticasone (FLONASE) 50 MCG/ACT nasal spray [Pharmacy Med Name: Fluticasone Propionate 50 MCG/ACT Nasal Suspension] 16 g 0    Sig: INSTILL 2 SPRAYS INTO BOTH NOSTRIL DAILY. USE FOR 4 TO 6 WEEKS THEN STOP AND USE SEASONALLY OR AS NEEDED.     Ear, Nose, and Throat: Nasal Preparations - Corticosteroids Passed - 04/16/2022  3:36 PM      Passed - Valid encounter within last 12 months    Recent Outpatient Visits          1 week ago Annual physical exam   [place], DO   2 months ago Essential hypertension   [place], DO   2 months ago Type 2 diabetes mellitus with peripheral neuropathy [facility])   [place], DO   5 months ago Type 2 diabetes mellitus with peripheral neuropathy [facility])   [facility] Olin Hauser, DO   1 year ago Annual physical exam   [place], DO      Future Appointments            In 2 months [place], Devonne Doughty, [facility], [facility]

## 2022-04-22 DIAGNOSIS — H8112 Benign paroxysmal vertigo, left ear: Secondary | ICD-10-CM | POA: Diagnosis not present

## 2022-04-27 ENCOUNTER — Other Ambulatory Visit: Payer: Self-pay | Admitting: Family Medicine

## 2022-04-27 DIAGNOSIS — R451 Restlessness and agitation: Secondary | ICD-10-CM

## 2022-04-28 DIAGNOSIS — R42 Dizziness and giddiness: Secondary | ICD-10-CM | POA: Diagnosis not present

## 2022-04-28 NOTE — Telephone Encounter (Signed)
Requested Prescriptions  Pending Prescriptions Disp Refills  . FLUoxetine (PROZAC) 40 MG capsule [Pharmacy Med Name: FLUoxetine HCl 40 MG Oral Capsule] 90 capsule 1    Sig: Take 1 capsule by mouth once daily     Psychiatry:  Antidepressants - SSRI Passed - 04/27/2022 11:53 AM      Passed - Valid encounter within last 6 months    Recent Outpatient Visits          2 weeks ago Annual physical exam   [place], DO   2 months ago Essential hypertension   [place], DO   2 months ago Type 2 diabetes mellitus with peripheral neuropathy [facility])   [facility] Olin Hauser, DO   5 months ago Type 2 diabetes mellitus with peripheral neuropathy [facility])   [facility] Olin Hauser, DO   1 year ago Annual physical exam   [facility] Olin Hauser, DO      Future Appointments            In 2 months Parks Ranger, Devonne Doughty, DO [facility], [place]           . insulin glargine (LANTUS) 100 UNIT/ML injection 60 mL 0    Sig: INJECT [place]     Endocrinology:  Diabetes - Insulins Failed - 04/27/2022 11:53 AM      Failed - HBA1C is between 0 and 7.9 and within 180 days    Hgb A1c MFr Bld  Date Value Ref Range Status  03/31/2022 8.9 (H) <5.7 % of total Hgb Final    Comment:    For someone without known diabetes, a hemoglobin A1c value of 6.5% or greater indicates that they may have  diabetes and this should be confirmed with a follow-up  test. . For someone with known diabetes, a value <7% indicates  that their diabetes is well controlled and a value  greater than or equal to 7% indicates suboptimal  control. A1c targets should be individualized based on  duration of diabetes, age, comorbid conditions, and  other considerations. . Currently, no consensus exists regarding use  of hemoglobin A1c for diagnosis of diabetes for children. Renella Cunas - Valid encounter within last 6 months    Recent Outpatient Visits          2 weeks ago Annual physical exam   [place], DO   2 months ago Essential hypertension   [place], DO   2 months ago Type 2 diabetes mellitus with peripheral neuropathy [facility])   [place], DO   5 months ago Type 2 diabetes mellitus with peripheral neuropathy [facility])   [facility] Olin Hauser, DO   1 year ago Annual physical exam   [place], DO      Future Appointments            In 2 months Parks Ranger, Devonne Doughty, [facility], [facility]

## 2022-05-11 ENCOUNTER — Other Ambulatory Visit: Payer: Self-pay | Admitting: Family Medicine

## 2022-05-11 DIAGNOSIS — I10 Essential (primary) hypertension: Secondary | ICD-10-CM

## 2022-05-11 DIAGNOSIS — R451 Restlessness and agitation: Secondary | ICD-10-CM

## 2022-05-11 NOTE — Telephone Encounter (Signed)
Requested Prescriptions  Pending Prescriptions Disp Refills  . metFORMIN (GLUCOPHAGE) 1000 MG tablet [Pharmacy Med Name: metFORMIN HCl 1000 MG Oral Tablet] 180 tablet 0    Sig: TAKE 1 TABLET BY MOUTH TWICE DAILY WITH  MEALS     Endocrinology:  Diabetes - Biguanides Failed - 05/11/2022 11:37 AM      Failed - HBA1C is between 0 and 7.9 and within 180 days    Hgb A1c MFr Bld  Date Value Ref Range Status  03/31/2022 8.9 (H) <5.7 % of total Hgb Final    Comment:    For someone without known diabetes, a hemoglobin A1c value of 6.5% or greater indicates that they may have  diabetes and this should be confirmed with a follow-up  test. . For someone with known diabetes, a value <7% indicates  that their diabetes is well controlled and a value  greater than or equal to 7% indicates suboptimal  control. A1c targets should be individualized based on  duration of diabetes, age, comorbid conditions, and  other considerations. . Currently, no consensus exists regarding use of hemoglobin A1c for diagnosis of diabetes for children. .          Failed - B12 Level in normal range and within 720 days    No results found for: "VITAMINB12"       Passed - Cr in normal range and within 360 days    Creat  Date Value Ref Range Status  03/31/2022 1.05 0.70 - 1.28 mg/dL Final         Passed - eGFR in normal range and within 360 days    GFR, Est African American  Date Value Ref Range Status  03/18/2021 104 > OR = 60 mL/min/1.56m Final   GFR, Est Non African American  Date Value Ref Range Status  03/18/2021 90 > OR = 60 mL/min/1.758mFinal   GFR, Estimated  Date Value Ref Range Status  01/30/2022 >60 >60 mL/min Final    Comment:    (NOTE) Calculated using the CKD-EPI Creatinine Equation (2021)    eGFR  Date Value Ref Range Status  03/31/2022 76 > OR = 60 mL/min/1.7368minal    Comment:    The eGFR is based on the CKD-EPI 2021 equation. To calculate  the new eGFR from a previous  Creatinine or Cystatin C result, go to https://www.kidney.org/professionals/ kdoqi/gfr%5Fcalculator          Passed - Valid encounter within last 6 months    Recent Outpatient Visits          1 month ago [facility]   3 months ago Essential hypertension   [place]   3 months ago [facility]   6 months ago Type 2 [facility] peripheral [facility]   1 year ago Annual physical exam   [place]      Future Appointments            In 2 months KarPar[place] RangerleDevonne DoughtyO [facility] normal limits and completed in the last 12 months    WBC  Date Value Ref Range Status  03/31/2022  6.7 3.8 - 10.8 Thousand/uL Final   RBC  Date Value Ref Range Status  03/31/2022 5.22 4.20 - 5.80 Million/uL Final   Hemoglobin  Date Value Ref Range Status  03/31/2022 13.5 13.2 - 17.1 g/dL Final   HCT  Date Value Ref Range Status  03/31/2022 42.9 38.5 - 50.0 % Final   MCHC  Date Value Ref Range Status  03/31/2022 31.5 (L) 32.0 - 36.0 g/dL Final   [facility]  Date Value Ref Range Status  03/31/2022 25.9 (L) 27.0 - 33.0 pg Final   MCV  Date Value Ref Range Status  03/31/2022 82.2 80.0 - 100.0 fL Final   No results found for: "PLTCOUNTKUC", "LABPLAT", "POCPLA" RDW  Date Value Ref Range Status  03/31/2022 14.2 11.0 - 15.0 % Final         . amLODipine (NORVASC) 5 MG tablet [Pharmacy Med Name: amLODIPine Besylate 5 MG Oral Tablet] 90 tablet 0    Sig: Take 1 tablet by mouth once daily     Cardiovascular: Calcium Channel Blockers 2 Passed - 05/11/2022 11:37 AM      Passed - Last BP in normal range    BP  Readings from Last 1 Encounters:  04/08/22 122/78         Passed - Last Heart Rate in normal range    Pulse Readings from Last 1 Encounters:  04/08/22 77         Passed - Valid encounter within last 6 months    Recent Outpatient Visits          1 month ago Annual physical exam   [place], DO   3 months ago Essential hypertension   [place], DO   3 months ago Type 2 diabetes mellitus with peripheral neuropathy [facility])   [facility] Olin Hauser, DO   6 months ago Type 2 diabetes mellitus with peripheral neuropathy [facility])   [facility] Olin Hauser, DO   1 year ago Annual physical exam   [facility] Olin Hauser, DO      Future Appointments            In 2 months Parks Ranger, Devonne Doughty, DO [facility], [place]           . FLUoxetine (PROZAC) 40 MG capsule [Pharmacy Med Name: FLUoxetine HCl 40 MG Oral Capsule] 90 capsule 0    Sig: Take 1 capsule by mouth once daily     Psychiatry:  Antidepressants - SSRI Passed - 05/11/2022 11:37 AM      Passed - Valid encounter within last 6 months    Recent Outpatient Visits          1 month ago Annual physical exam   [place], DO   3 months ago Essential hypertension   [place], DO   3 months ago Type 2 diabetes mellitus with peripheral neuropathy [facility])   [facility] Olin Hauser, DO   6 months ago Type 2 diabetes mellitus with peripheral neuropathy [facility])   [facility] Olin Hauser, DO   1 year ago Annual physical exam   [facility] Olin Hauser, DO      Future Appointments            In 2 months Parks Ranger, [place]  [place]

## 2022-05-11 NOTE — Telephone Encounter (Signed)
LRF 04/28/22 #90 1 refill. Receipt confirmed by pharmacy 04/28/22 at 9:14  Requested Prescriptions  Pending Prescriptions Disp Refills  . FLUoxetine (PROZAC) 40 MG capsule [Pharmacy Med Name: FLUoxetine HCl 40 MG Oral Capsule] 90 capsule 0    Sig: Take 1 capsule by mouth once daily     Psychiatry:  Antidepressants - SSRI Passed - 05/11/2022 11:37 AM      Passed - Valid encounter within last 6 months    Recent Outpatient Visits          1 month ago Annual physical exam   [place], DO   3 months ago Essential hypertension   [place], DO   3 months ago Type 2 diabetes mellitus with peripheral neuropathy [facility])   [facility] Olin Hauser, DO   6 months ago Type 2 diabetes mellitus with peripheral neuropathy ([place])   [facility] Olin Hauser, DO   1 year ago Annual physical exam   [place], DO      Future Appointments            In 2 months Parks Ranger, Devonne Doughty, [facility]           Signed Prescriptions Disp Refills   metFORMIN (GLUCOPHAGE) 1000 MG tablet 180 tablet 0    Sig: TAKE 1 TABLET BY MOUTH TWICE DAILY WITH  MEALS     Endocrinology:  Diabetes - Biguanides Failed - 05/11/2022 11:37 AM      Failed - HBA1C is between 0 and 7.9 and within 180 days    Hgb A1c MFr Bld  Date Value Ref Range Status  03/31/2022 8.9 (H) <5.7 % of total Hgb Final    Comment:    For someone without known diabetes, a hemoglobin A1c value of 6.5% or greater indicates that they may have  diabetes and this should be confirmed with a follow-up  test. . For someone with known diabetes, a value <7% indicates  that their diabetes is well controlled and a value  greater than or equal to 7% indicates suboptimal  control. A1c targets should be individualized based on  duration of diabetes, age,  comorbid conditions, and  other considerations. . Currently, no consensus exists regarding use of hemoglobin A1c for diagnosis of diabetes for children. .          Failed - B12 Level in normal range and within 720 days    No results found for: "VITAMINB12"       Passed - Cr in normal range and within 360 days    Creat  Date Value Ref Range Status  03/31/2022 1.05 0.70 - 1.28 mg/dL Final         Passed - eGFR in normal range and within 360 days    GFR, Est African American  Date Value Ref Range Status  03/18/2021 104 > OR = 60 mL/min/1.61m Final   GFR, Est Non African American  Date Value Ref Range Status  03/18/2021 90 > OR = 60 mL/min/1.767mFinal   GFR, Estimated  Date Value Ref Range Status  01/30/2022 >60 >60 mL/min Final    Comment:    (NOTE) Calculated using the CKD-EPI Creatinine Equation (2021)    eGFR  Date Value Ref Range Status  03/31/2022 76 > OR = 60 mL/min/1.7329minal    Comment:    The eGFR is based  on the CKD-EPI 2021 equation. To calculate  the new eGFR from a previous Creatinine or Cystatin C result, go to https://www.kidney.org/professionals/ kdoqi/gfr%5Fcalculator          Passed - Valid encounter within last 6 months    Recent Outpatient Visits          1 month ago Annual physical exam   [place], Devonne Doughty, DO   3 months ago Essential hypertension   [place], DO   3 months ago Type 2 diabetes mellitus with peripheral neuropathy [facility])   [facility] Olin Hauser, DO   6 months ago Type 2 diabetes mellitus with peripheral neuropathy ([place])   [facility] Olin Hauser, DO   1 year ago Annual physical exam   [place], DO      Future Appointments            In 2 months Parks Ranger, Devonne Doughty, DO [facility] [place] within  normal limits and completed in the last 12 months    WBC  Date Value Ref Range Status  03/31/2022 6.7 3.8 - 10.8 Thousand/uL Final   RBC  Date Value Ref Range Status  03/31/2022 5.22 4.20 - 5.80 Million/uL Final   Hemoglobin  Date Value Ref Range Status  03/31/2022 13.5 13.2 - 17.1 g/dL Final   HCT  Date Value Ref Range Status  03/31/2022 42.9 38.5 - 50.0 % Final   MCHC  Date Value Ref Range Status  03/31/2022 31.5 (L) 32.0 - 36.0 g/dL Final   [facility]  Date Value Ref Range Status  03/31/2022 25.9 (L) 27.0 - 33.0 pg Final   MCV  Date Value Ref Range Status  03/31/2022 82.2 80.0 - 100.0 fL Final   No results found for: "PLTCOUNTKUC", "LABPLAT", "POCPLA" RDW  Date Value Ref Range Status  03/31/2022 14.2 11.0 - 15.0 % Final          amLODipine (NORVASC) 5 MG tablet 90 tablet 0    Sig: Take 1 tablet by mouth once daily     Cardiovascular: Calcium Channel Blockers 2 Passed - 05/11/2022 11:37 AM      Passed - Last BP in normal range    BP Readings from Last 1 Encounters:  04/08/22 122/78         Passed - Last Heart Rate in normal range    Pulse Readings from Last 1 Encounters:  04/08/22 77         Passed - Valid encounter within last 6 months    Recent Outpatient Visits          1 month ago Annual physical exam   [facility] Olin Hauser, DO   3 months ago Essential hypertension   [place], DO   3 months ago Type 2 diabetes mellitus with peripheral neuropathy [facility])   [facility] Olin Hauser, DO   6 months ago Type 2 diabetes mellitus with peripheral neuropathy [facility])   [facility] Olin Hauser, DO   1 year ago Annual physical exam   [facility] Olin Hauser, DO      Future Appointments            In 2 months Parks Ranger, Devonne Doughty, DO [place]

## 2022-05-14 ENCOUNTER — Other Ambulatory Visit: Payer: Self-pay | Admitting: Family Medicine

## 2022-05-14 DIAGNOSIS — E1142 Type 2 diabetes mellitus with diabetic polyneuropathy: Secondary | ICD-10-CM

## 2022-05-16 NOTE — Telephone Encounter (Signed)
Requested medication (s) are due for refill today: Unclear, got 80m ordered 04/08/22  Requested medication (s) are on the active medication list: yes  Last refill:  04/08/22 623mwith 0 RF  Future visit scheduled: 07/14/22  Notes to clinic:  This med  does not have a protocol to follow, it looks like pt received 3 months worth in May, please assess.      Requested Prescriptions  Pending Prescriptions Disp Refills   MOUNJARO 5 MG/0.5ML Pen [Pharmacy Med Name: Mounjaro 5 MG/0.5ML Subcutaneous Solution Pen-injector] 4 mL 0    Sig: INJECT '5MG'$  DOSE SUBCUTANEOUSLY ONCE A WEEK     Off-Protocol Failed - 05/14/2022 10:47 AM      Failed - Medication not assigned to a protocol, review manually.      Passed - Valid encounter within last 12 months    Recent Outpatient Visits           1 month ago Annual physical exam   [place]   3 months ago Essential hypertension   [place]   3 months ago Type 2 diabetes mellitus with peripheral neuropathy ([facility]  [facility]   6 months ago [facility] with peripheral [facility]  [facility]   1 year ago Annual physical exam   [place]       Future Appointments             In 1 month [place] RangerAlDevonne DoughtyDO [facility]

## 2022-05-20 ENCOUNTER — Encounter: Payer: Self-pay | Admitting: Neurology

## 2022-05-20 ENCOUNTER — Ambulatory Visit: Payer: BC Managed Care – PPO | Admitting: Neurology

## 2022-05-20 VITALS — BP 168/89 | HR 78 | Ht 70.5 in | Wt 239.0 lb

## 2022-05-20 DIAGNOSIS — R42 Dizziness and giddiness: Secondary | ICD-10-CM | POA: Diagnosis not present

## 2022-05-20 DIAGNOSIS — E1142 Type 2 diabetes mellitus with diabetic polyneuropathy: Secondary | ICD-10-CM | POA: Insufficient documentation

## 2022-05-20 DIAGNOSIS — H814 Vertigo of central origin: Secondary | ICD-10-CM | POA: Diagnosis not present

## 2022-05-20 DIAGNOSIS — R270 Ataxia, unspecified: Secondary | ICD-10-CM | POA: Insufficient documentation

## 2022-05-20 NOTE — Progress Notes (Signed)
Chief Complaint  Patient presents with   New Patient (Initial Visit)    Pt with referring MD- Pt with wife, rm 15. States that he had a spell of dizziness that started in march. On 3/5 went to ER because he fell and hit head. He developed a SDH from that fall. He has continued to have dizziness and was evaluated by ENT who states it is postural dizziness. He says it has gotten some better but remains present      ASSESSMENT AND PLAN  Henry Horne is a 71 y.o. male   Vertigo, slurred speech Fall Trace left frontal subdural hemorrhage, small amount of falx subarachnoid hemorrhage  He does have multiple vascular risk factors, including obesity, high risk for obstructive sleep apnea, hypertension, hyperlipidemia, poorly controlled diabetes, evidence of mild diabetic peripheral neuropathy,  The differentiation diagnosis of his persistent vertigo, unexplained sudden falling episode could be due to TIA,  Proceed with MRI of the brain, MRA of the brain and neck,  Return to clinic in 6 months with nurse practitioner      DIAGNOSTIC DATA (LABS, IMAGING, TESTING) - I reviewed patient records, labs, notes, testing and imaging myself where available.   MEDICAL HISTORY:  Henry Horne is a 71 year old male, accompanied by his wife, seen in request by his primary care physician Dr. Althea Charon, Lyn Hollingshead for evaluation of dizziness, history of fall, initial evaluation was on May 20, 2022.  I reviewed and summarized the referring note. PMhx. HTN DM--since 1996 Anxiety Bladder Cancer. Prostate cancer,  Obesity  On January 30, 2022, wife went to the front door, patient went to the porch at the back door to feed his cat, 5 minutes later, while his wife did not see him coming back home as expected, went to check on him, he was found, could lie on the ground on the concrete floor, but to both feet was on the second steps of the stairs, he apparently fell backwards, patient could recall that  he was ready to taking up the steps, now he is going to fall backwards, but failed to catch the handrail using his hand, apparently had transient loss of consciousness, he could not recall detail afterwards  When wife found him, he was mumbling, confused, slurred speech, EMS was called, upon arriving to the emergency room blood pressure 157/83, pulse of 96, normal temperature 98.9, prior to the fall, he also complains of not been feeling well for 2 weeks, cough, worsening over the past few days, was seen by urgent care physician the day of the event was given antibiotic for COPD exacerbation, he did have a history of smoker in the past, but stopped now,  Per wife paramedic glucose level was near 500, emergency room glucose was 336  I personally reviewed CT, left side very thin layer of trace subdural hematoma trace hyperdensity blood along the falx suggestive of small subarachnoid hemorrhage  CT cervical spine showed no acute fracture, mild degenerative changes  Laboratory evaluation in 2023, negative troponin, CBC showed hemoglobin of 13.9, A1c of 8.9, lipid panel LDL 119, CMP showed normal creatinine 1.05,  He did suffer left parietal region skin abrasion, gradually healed, but since that event, he had multiple vertigo, this usually triggered after overnight lying flat when he first got up, transient spinning sensation, he has to hold still for his to pass in couple minutes, he was seen by ENT, had Epley's maneuver, his dizziness has much improved  He rarely has dizziness when he  is lying down from sitting position, he does have obstructive sleep apnea, but predicted that he will not be able to tolerate CPAP machine, never had sleep study," conforming to sleep at any minute", 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  PHYSICAL EXAMNIATION:  Gen: NAD, conversant, well nourised, well groomed                     Cardiovascular: Regular rate rhythm, no peripheral edema, warm, nontender. Eyes: Conjunctivae clear without exudates or hemorrhage Neck: Supple, no carotid bruits. Pulmonary: Clear to auscultation bilaterally   NEUROLOGICAL EXAM:  MENTAL STATUS: Speech/cognition: Central obesity Awake, alert, oriented to history taking and casual conversation CRANIAL NERVES: CN II: Visual fields are full to confrontation. Pupils are round equal and briskly reactive to light. CN III, IV, VI: extraocular movement are normal. No ptosis. CN V: Facial sensation is intact to light touch CN VII: Face is symmetric with normal eye closure  CN VIII: Hearing is normal to causal conversation. CN IX, X: Phonation is normal. CN XI: Head turning and shoulder shrug are intact CN XII: Narrow oropharyngeal space  MOTOR: There is no pronator drift of out-stretched arms. Muscle bulk and tone are normal. Muscle strength is normal.  REFLEXES: Reflexes are 2+ and symmetric at the biceps, triceps, knees, and absent at ankles. Plantar responses are flexor.  SENSORY: Decreased vibratory sensation at toes, length-dependent decreased pinprick to bilateral ankle level  COORDINATION: There is no trunk or limb dysmetria noted.  GAIT/STANCE: He needs push-up to get up from seated position, cautious, steady  REVIEW OF SYSTEMS:  Full 14 system review of systems performed and notable only for as above All other review of systems were negative.   ALLERGIES: Allergies  Allergen Reactions   Zetia [Ezetimibe] Other (See Comments)    Joint pain, congestion, muscle ache Joint pain, congestion, muscle ache   Ace Inhibitors Cough   Pneumococcal Vaccine    Pneumococcal Vaccines      HOME MEDICATIONS: Current Outpatient Medications  Medication Sig Dispense Refill   albuterol (VENTOLIN HFA) 108 (90 Base) MCG/ACT inhaler INHALE 1 TO 2 PUFFS BY MOUTH EVERY 4 HOURS AS NEEDED FOR WHEEZING OR SHORTNESS OF BREATH 9 g 0   amLODipine (NORVASC) 5 MG tablet Take 1 tablet by mouth once daily 90 tablet 0   Continuous Blood Gluc Receiver (FREESTYLE LIBRE 14 DAY READER) DEVI USE 1 EACH EVERY 14 DAYS. USE TO CHECK BLOOD SUGAR. 6 each 3   Continuous Blood Gluc Sensor (FREESTYLE LIBRE 14 DAY SENSOR) MISC USE TO CHECK BLOOD SUGAR AS NEEDED EVERY 14 DAYS 2 each 5   diclofenac sodium (VOLTAREN) 1 % GEL Apply 2 g topically 3 (three) times daily as needed. For knees 100 g 2   FLUoxetine (PROZAC) 40 MG capsule Take 1 capsule by mouth once daily 90 capsule 1   fluticasone (FLONASE) 50 MCG/ACT nasal spray INSTILL 2 SPRAYS INTO BOTH NOSTRIL DAILY. USE FOR 4 TO 6 WEEKS THEN STOP AND USE SEASONALLY OR AS NEEDED. 16 g 3   Fluticasone-Salmeterol (ADVAIR)  250-50 MCG/DOSE AEPB Inhale 1 puff into the lungs 2 (two) times daily. 60 each 5   Garlic Oil 2 MG CAPS Take 1,000 mg by mouth 2 (two) times daily. Reported on 11/17/2015     glucose blood (TRUETEST TEST) test strip 1 each by Other route 2 (two) times daily. Use as instructed 100 each 12   imipramine (TOFRANIL) 10 MG tablet Take 1 tablet (10 mg total) by mouth at bedtime. 90 tablet 3   insulin glargine (LANTUS) 100 UNIT/ML injection INJECT 70 UNITS SUBCUTANEOUSLY ONCE DAILY 60 mL 0   Insulin Pen Needle (NOVOFINE) 30G X 8 MM MISC Use 1 nweedle to inject Victoza once daily. 100 each 12   Insulin Pen Needle 31G X 5 MM MISC 1 each by Does not apply route 3 (three) times daily. 450 each 3   Insulin Pen Needle 31G X 8 MM MISC Use as directed 100 each 0   Insulin Syringe-Needle U-100 31G X 5/16" 1 ML MISC 100 each by Does not apply route 2 (two) times daily. 100 each 11   losartan (COZAAR) 50 MG tablet Take 1 tablet (50 mg total) by mouth daily. 90 tablet 3    metFORMIN (GLUCOPHAGE) 1000 MG tablet TAKE 1 TABLET BY MOUTH TWICE DAILY WITH  MEALS 180 tablet 0   MOUNJARO 5 MG/0.5ML Pen INJECT 5MG  DOSE SUBCUTANEOUSLY ONCE A WEEK 4 mL 0   omeprazole (PRILOSEC) 20 MG capsule Take 20 mg by mouth daily.     traMADol (ULTRAM) 50 MG tablet Take 1 tablet (50 mg total) by mouth every 8 (eight) hours as needed. 30 tablet 2   TRUEPLUS LANCETS 30G MISC Use 1 lancet  to check Blood sugar twice daily 100 each 12   No current facility-administered medications for this visit.    PAST MEDICAL HISTORY: Past Medical History:  Diagnosis Date   Anxiety    Cancer of kidney (HCC) 2010   cystoscopy 2010, 2014, 2015, 2016- MD q 6 months   Depression    Emphysema of lung (HCC) 1996   Hyperlipidemia    Hypertension    Joint pain    Prostate cancer (HCC) 2008   removed with cryotherapy    PAST SURGICAL HISTORY: Past Surgical History:  Procedure Laterality Date   DIGIT NAIL REMOVAL Left 2016   left and right foot -toe nails    FAMILY HISTORY: Family History  Problem Relation Age of Onset   Diabetes Mother    Heart disease Father    Cancer Father    Hyperlipidemia Father    Hypertension Father    Diabetes Brother    COPD Brother    Autoimmune disease Brother    Heart disease Brother    Diabetes Brother     SOCIAL HISTORY: Social History   Socioeconomic History   Marital status: Married    Spouse name: Lupita Leash   Number of children: Not on file   Years of education: Not on file   Highest education level: Not on file  Occupational History   Not on file  Tobacco Use   Smoking status: Former    Years: 30.00    Types: Cigarettes    Quit date: 05/18/2004    Years since quitting: 18.0   Smokeless tobacco: Former  Building services engineer Use: Never used  Substance and Sexual Activity   Alcohol use: No    Alcohol/week: 0.0 standard drinks of alcohol   Drug use: No   Sexual activity: Not on file  Other Topics Concern   Not on file  Social History  Narrative   Not on file   Social Determinants of Health   Financial Resource Strain: Not on file  Food Insecurity: Not on file  Transportation Needs: Not on file  Physical Activity: Not on file  Stress: Not on file  Social Connections: Not on file  Intimate Partner Violence: Not on file      Levert Feinstein, M.D. Ph.D.  [facility] [address] [place], [place] [address] Ph: [phone] Fax: [phone]  CC:  Smitty Cords, DO [address] [place],  [place] [address]  Smitty Cords, DO

## 2022-05-21 LAB — TSH: TSH: 1.42 u[IU]/mL (ref 0.450–4.500)

## 2022-05-21 LAB — VITAMIN B12: Vitamin B-12: 249 pg/mL (ref 232–1245)

## 2022-05-22 ENCOUNTER — Other Ambulatory Visit: Payer: Self-pay | Admitting: Internal Medicine

## 2022-05-22 ENCOUNTER — Other Ambulatory Visit: Payer: Self-pay | Admitting: Family Medicine

## 2022-05-22 DIAGNOSIS — E1142 Type 2 diabetes mellitus with diabetic polyneuropathy: Secondary | ICD-10-CM

## 2022-05-22 DIAGNOSIS — I10 Essential (primary) hypertension: Secondary | ICD-10-CM

## 2022-05-25 ENCOUNTER — Other Ambulatory Visit: Payer: Self-pay

## 2022-05-25 DIAGNOSIS — I10 Essential (primary) hypertension: Secondary | ICD-10-CM

## 2022-05-25 MED ORDER — LOSARTAN POTASSIUM 50 MG PO TABS: 50.0000 mg | ORAL_TABLET | Freq: Every day | ORAL | 3 refills | Status: DC

## 2022-06-07 ENCOUNTER — Telehealth: Payer: Self-pay | Admitting: Neurology

## 2022-06-07 NOTE — Telephone Encounter (Signed)
MRI brain BCBS/medicare [id number] exp. 06/07/22-08/05/22/NPR sent to GI MRA head & neck BCBS/medicare auth: [id number] exp. 06/07/22-08/05/22/NPR

## 2022-06-12 ENCOUNTER — Other Ambulatory Visit: Payer: Self-pay

## 2022-06-12 ENCOUNTER — Emergency Department: Payer: BC Managed Care – PPO

## 2022-06-12 DIAGNOSIS — J45909 Unspecified asthma, uncomplicated: Secondary | ICD-10-CM | POA: Diagnosis not present

## 2022-06-12 DIAGNOSIS — Z8551 Personal history of malignant neoplasm of bladder: Secondary | ICD-10-CM | POA: Insufficient documentation

## 2022-06-12 DIAGNOSIS — Z794 Long term (current) use of insulin: Secondary | ICD-10-CM | POA: Diagnosis not present

## 2022-06-12 DIAGNOSIS — R42 Dizziness and giddiness: Secondary | ICD-10-CM | POA: Diagnosis not present

## 2022-06-12 DIAGNOSIS — Z79899 Other long term (current) drug therapy: Secondary | ICD-10-CM | POA: Diagnosis not present

## 2022-06-12 DIAGNOSIS — R079 Chest pain, unspecified: Secondary | ICD-10-CM | POA: Insufficient documentation

## 2022-06-12 DIAGNOSIS — I10 Essential (primary) hypertension: Secondary | ICD-10-CM | POA: Diagnosis not present

## 2022-06-12 DIAGNOSIS — D72829 Elevated white blood cell count, unspecified: Secondary | ICD-10-CM | POA: Diagnosis not present

## 2022-06-12 DIAGNOSIS — R4182 Altered mental status, unspecified: Secondary | ICD-10-CM | POA: Diagnosis not present

## 2022-06-12 DIAGNOSIS — J439 Emphysema, unspecified: Secondary | ICD-10-CM | POA: Insufficient documentation

## 2022-06-12 DIAGNOSIS — Z87891 Personal history of nicotine dependence: Secondary | ICD-10-CM | POA: Insufficient documentation

## 2022-06-12 DIAGNOSIS — R270 Ataxia, unspecified: Principal | ICD-10-CM | POA: Insufficient documentation

## 2022-06-12 DIAGNOSIS — R531 Weakness: Secondary | ICD-10-CM | POA: Diagnosis not present

## 2022-06-12 DIAGNOSIS — E1142 Type 2 diabetes mellitus with diabetic polyneuropathy: Secondary | ICD-10-CM | POA: Diagnosis not present

## 2022-06-12 DIAGNOSIS — R0789 Other chest pain: Secondary | ICD-10-CM | POA: Diagnosis not present

## 2022-06-12 DIAGNOSIS — Z7984 Long term (current) use of oral hypoglycemic drugs: Secondary | ICD-10-CM | POA: Diagnosis not present

## 2022-06-12 DIAGNOSIS — Z85528 Personal history of other malignant neoplasm of kidney: Secondary | ICD-10-CM | POA: Diagnosis not present

## 2022-06-12 DIAGNOSIS — Z8546 Personal history of malignant neoplasm of prostate: Secondary | ICD-10-CM | POA: Diagnosis not present

## 2022-06-12 LAB — CBC
HCT: 41.6 % (ref 39.0–52.0)
Hemoglobin: 13.2 g/dL (ref 13.0–17.0)
MCH: 25.7 pg — ABNORMAL LOW (ref 26.0–34.0)
MCHC: 31.7 g/dL (ref 30.0–36.0)
MCV: 80.9 fL (ref 80.0–100.0)
Platelets: 241 10*3/uL (ref 150–400)
RBC: 5.14 MIL/uL (ref 4.22–5.81)
RDW: 13.3 % (ref 11.5–15.5)
WBC: 12.8 10*3/uL — ABNORMAL HIGH (ref 4.0–10.5)
nRBC: 0 % (ref 0.0–0.2)

## 2022-06-12 LAB — BASIC METABOLIC PANEL
Anion gap: 8 (ref 5–15)
BUN: 19 mg/dL (ref 8–23)
CO2: 23 mmol/L (ref 22–32)
Calcium: 8.7 mg/dL — ABNORMAL LOW (ref 8.9–10.3)
Chloride: 103 mmol/L (ref 98–111)
Creatinine, Ser: 1.07 mg/dL (ref 0.61–1.24)
GFR, Estimated: >60 mL/min (ref >60)
Glucose, Bld: 198 mg/dL — ABNORMAL HIGH (ref 70–99)
Potassium: 3.7 mmol/L (ref 3.5–5.1)
Sodium: 134 mmol/L — ABNORMAL LOW (ref 135–145)

## 2022-06-12 LAB — TROPONIN I (HIGH SENSITIVITY): Troponin I (High Sensitivity): 7 ng/L (ref <18)

## 2022-06-12 NOTE — ED Triage Notes (Signed)
Patient reports dizziness, generalized weakness, unsteadiness on feet and states he has had intermittent "twinges" to chest throughout the day. Denies shortness of breath. Alert, oriented to name, place, month and situation, states year is 2022. Follows directions to overcome gravity with bilateral arms and legs. Clear speech, no facial droop.

## 2022-06-13 ENCOUNTER — Observation Stay: Payer: BC Managed Care – PPO

## 2022-06-13 ENCOUNTER — Encounter: Payer: Self-pay | Admitting: Radiology

## 2022-06-13 ENCOUNTER — Observation Stay
Admission: EM | Admit: 2022-06-13 | Discharge: 2022-06-14 | Disposition: A | Discharge status: Home / Self Care | Payer: BC Managed Care – PPO | Attending: Internal Medicine | Admitting: Internal Medicine

## 2022-06-13 DIAGNOSIS — I10 Essential (primary) hypertension: Secondary | ICD-10-CM | POA: Diagnosis present

## 2022-06-13 DIAGNOSIS — F418 Other specified anxiety disorders: Secondary | ICD-10-CM | POA: Diagnosis present

## 2022-06-13 DIAGNOSIS — R270 Ataxia, unspecified: Secondary | ICD-10-CM | POA: Diagnosis not present

## 2022-06-13 DIAGNOSIS — I6782 Cerebral ischemia: Secondary | ICD-10-CM | POA: Diagnosis not present

## 2022-06-13 DIAGNOSIS — D72829 Elevated white blood cell count, unspecified: Secondary | ICD-10-CM | POA: Diagnosis present

## 2022-06-13 DIAGNOSIS — J341 Cyst and mucocele of nose and nasal sinus: Secondary | ICD-10-CM | POA: Diagnosis not present

## 2022-06-13 DIAGNOSIS — M4316 Spondylolisthesis, lumbar region: Secondary | ICD-10-CM | POA: Diagnosis not present

## 2022-06-13 DIAGNOSIS — J4520 Mild intermittent asthma, uncomplicated: Secondary | ICD-10-CM

## 2022-06-13 DIAGNOSIS — R42 Dizziness and giddiness: Secondary | ICD-10-CM

## 2022-06-13 DIAGNOSIS — E669 Obesity, unspecified: Secondary | ICD-10-CM | POA: Diagnosis present

## 2022-06-13 DIAGNOSIS — J45909 Unspecified asthma, uncomplicated: Secondary | ICD-10-CM | POA: Diagnosis present

## 2022-06-13 DIAGNOSIS — R29818 Other symptoms and signs involving the nervous system: Secondary | ICD-10-CM | POA: Diagnosis not present

## 2022-06-13 DIAGNOSIS — E785 Hyperlipidemia, unspecified: Secondary | ICD-10-CM | POA: Diagnosis present

## 2022-06-13 DIAGNOSIS — R079 Chest pain, unspecified: Secondary | ICD-10-CM

## 2022-06-13 DIAGNOSIS — E1142 Type 2 diabetes mellitus with diabetic polyneuropathy: Secondary | ICD-10-CM | POA: Diagnosis present

## 2022-06-13 HISTORY — DX: Traumatic subdural hemorrhage with loss of consciousness status unknown, initial encounter: S06.5XAA

## 2022-06-13 LAB — URINALYSIS, ROUTINE W REFLEX MICROSCOPIC
Bilirubin Urine: NEGATIVE
Glucose, UA: 100 mg/dL — AB
Leukocytes,Ua: NEGATIVE
Nitrite: NEGATIVE
Protein, ur: NEGATIVE mg/dL
Specific Gravity, Urine: 1.01 (ref 1.005–1.030)
pH: 5.5 (ref 5.0–8.0)

## 2022-06-13 LAB — VITAMIN B12: Vitamin B-12: 175 pg/mL — ABNORMAL LOW (ref 180–914)

## 2022-06-13 LAB — GLUCOSE, CAPILLARY: Glucose-Capillary: 261 mg/dL — ABNORMAL HIGH (ref 70–99)

## 2022-06-13 LAB — URINALYSIS, MICROSCOPIC (REFLEX)
Bacteria, UA: NONE SEEN
RBC / HPF: NONE SEEN RBC/hpf (ref 0–5)
Squamous Epithelial / HPF: NONE SEEN (ref 0–5)
WBC, UA: NONE SEEN WBC/hpf (ref 0–5)

## 2022-06-13 LAB — URINE DRUG SCREEN, QUALITATIVE (ARMC ONLY)
Amphetamines, Ur Screen: NOT DETECTED
Barbiturates, Ur Screen: NOT DETECTED
Benzodiazepine, Ur Scrn: NOT DETECTED
Cannabinoid 50 Ng, Ur ~~LOC~~: NOT DETECTED
Cocaine Metabolite,Ur ~~LOC~~: NOT DETECTED
MDMA (Ecstasy)Ur Screen: NOT DETECTED
Methadone Scn, Ur: NOT DETECTED
Opiate, Ur Screen: NOT DETECTED
Phencyclidine (PCP) Ur S: NOT DETECTED
Tricyclic, Ur Screen: POSITIVE — AB

## 2022-06-13 LAB — LIPID PANEL
Cholesterol: 174 mg/dL (ref 0–200)
HDL: 37 mg/dL — ABNORMAL LOW (ref >40)
LDL Cholesterol: 120 mg/dL — ABNORMAL HIGH (ref 0–99)
Total CHOL/HDL Ratio: 4.7 RATIO
Triglycerides: 83 mg/dL (ref <150)
VLDL: 17 mg/dL (ref 0–40)

## 2022-06-13 LAB — CBG MONITORING, ED
Glucose-Capillary: 176 mg/dL — ABNORMAL HIGH (ref 70–99)
Glucose-Capillary: 176 mg/dL — ABNORMAL HIGH (ref 70–99)

## 2022-06-13 LAB — TROPONIN I (HIGH SENSITIVITY): Troponin I (High Sensitivity): 7 ng/L (ref <18)

## 2022-06-13 MED ORDER — GADOBUTROL 1 MMOL/ML IV SOLN
10.0000 mL | Freq: Once | INTRAVENOUS | Status: AC | PRN
  Administered 2022-06-13: 10 mL via INTRAVENOUS

## 2022-06-13 MED ORDER — FLUOXETINE HCL 20 MG PO CAPS
40.0000 mg | ORAL_CAPSULE | Freq: Every day | ORAL | Status: DC
  Administered 2022-06-13 – 2022-06-14 (×2): 40 mg via ORAL
  Filled 2022-06-13 (×2): qty 2

## 2022-06-13 MED ORDER — INSULIN GLARGINE-YFGN 100 UNIT/ML ~~LOC~~ SOLN
70.0000 [IU] | Freq: Every day | SUBCUTANEOUS | Status: DC
  Filled 2022-06-13: qty 0.7

## 2022-06-13 MED ORDER — FLUTICASONE FUROATE-VILANTEROL 200-25 MCG/ACT IN AEPB
1.0000 | INHALATION_SPRAY | Freq: Every day | RESPIRATORY_TRACT | Status: DC
  Administered 2022-06-13: 1 via RESPIRATORY_TRACT

## 2022-06-13 MED ORDER — NITROGLYCERIN 2 % TD OINT
1.0000 [in_us] | TOPICAL_OINTMENT | Freq: Once | TRANSDERMAL | Status: AC
  Administered 2022-06-13: 1 [in_us] via TOPICAL
  Filled 2022-06-13: qty 1

## 2022-06-13 MED ORDER — INSULIN ASPART 100 UNIT/ML IJ SOLN
0.0000 [IU] | Freq: Three times a day (TID) | INTRAMUSCULAR | Status: DC
  Administered 2022-06-13 (×2): 2 [IU] via SUBCUTANEOUS
  Administered 2022-06-14: 3 [IU] via SUBCUTANEOUS
  Administered 2022-06-14: 5 [IU] via SUBCUTANEOUS
  Filled 2022-06-13 (×4): qty 1

## 2022-06-13 MED ORDER — SODIUM CHLORIDE 0.9 % IV SOLN: INTRAVENOUS | Status: DC

## 2022-06-13 MED ORDER — ALBUTEROL SULFATE HFA 108 (90 BASE) MCG/ACT IN AERS: 2.0000 | INHALATION_SPRAY | RESPIRATORY_TRACT | Status: DC | PRN

## 2022-06-13 MED ORDER — ENOXAPARIN SODIUM 40 MG/0.4ML IJ SOSY: 40.0000 mg | PREFILLED_SYRINGE | INTRAMUSCULAR | Status: DC

## 2022-06-13 MED ORDER — ACETAMINOPHEN 160 MG/5ML PO SOLN: 650.0000 mg | ORAL | Status: DC | PRN

## 2022-06-13 MED ORDER — ATORVASTATIN CALCIUM 20 MG PO TABS
20.0000 mg | ORAL_TABLET | Freq: Every day | ORAL | Status: DC
  Filled 2022-06-13 (×2): qty 1

## 2022-06-13 MED ORDER — STROKE: EARLY STAGES OF RECOVERY BOOK: Freq: Once | Status: AC

## 2022-06-13 MED ORDER — ASPIRIN 81 MG PO CHEW
324.0000 mg | CHEWABLE_TABLET | Freq: Once | ORAL | Status: AC
  Administered 2022-06-13: 324 mg via ORAL
  Filled 2022-06-13: qty 4

## 2022-06-13 MED ORDER — FLUTICASONE FUROATE-VILANTEROL 200-25 MCG/ACT IN AEPB
1.0000 | INHALATION_SPRAY | Freq: Every day | RESPIRATORY_TRACT | Status: DC
  Filled 2022-06-13: qty 28

## 2022-06-13 MED ORDER — LOSARTAN POTASSIUM 50 MG PO TABS
50.0000 mg | ORAL_TABLET | Freq: Every day | ORAL | Status: DC
  Administered 2022-06-13 – 2022-06-14 (×2): 50 mg via ORAL
  Filled 2022-06-13 (×2): qty 1

## 2022-06-13 MED ORDER — ALBUTEROL SULFATE HFA 108 (90 BASE) MCG/ACT IN AERS
2.0000 | INHALATION_SPRAY | Freq: Four times a day (QID) | RESPIRATORY_TRACT | Status: DC | PRN
  Administered 2022-06-14: 2 via RESPIRATORY_TRACT
  Filled 2022-06-13: qty 6.7

## 2022-06-13 MED ORDER — ASPIRIN 81 MG PO TBEC
81.0000 mg | DELAYED_RELEASE_TABLET | Freq: Every day | ORAL | Status: DC
Start: 2022-06-13 — End: 2022-06-14
  Administered 2022-06-13 – 2022-06-14 (×2): 81 mg via ORAL
  Filled 2022-06-13 (×2): qty 1

## 2022-06-13 MED ORDER — IPRATROPIUM-ALBUTEROL 0.5-2.5 (3) MG/3ML IN SOLN
3.0000 mL | Freq: Once | RESPIRATORY_TRACT | Status: AC
  Administered 2022-06-13: 3 mL via RESPIRATORY_TRACT
  Filled 2022-06-13: qty 3

## 2022-06-13 MED ORDER — MECLIZINE HCL 25 MG PO TABS
25.0000 mg | ORAL_TABLET | Freq: Three times a day (TID) | ORAL | Status: DC | PRN
Start: 2022-06-13 — End: 2022-06-14
  Administered 2022-06-13: 25 mg via ORAL
  Filled 2022-06-13: qty 1

## 2022-06-13 MED ORDER — INSULIN ASPART 100 UNIT/ML IJ SOLN
0.0000 [IU] | Freq: Every day | INTRAMUSCULAR | Status: DC
  Administered 2022-06-13: 3 [IU] via SUBCUTANEOUS
  Filled 2022-06-13: qty 1

## 2022-06-13 MED ORDER — AMLODIPINE BESYLATE 5 MG PO TABS
5.0000 mg | ORAL_TABLET | Freq: Every day | ORAL | Status: DC
  Administered 2022-06-13 – 2022-06-14 (×2): 5 mg via ORAL
  Filled 2022-06-13 (×2): qty 1

## 2022-06-13 MED ORDER — ALBUTEROL SULFATE (2.5 MG/3ML) 0.083% IN NEBU: 2.5000 mg | INHALATION_SOLUTION | RESPIRATORY_TRACT | Status: DC | PRN

## 2022-06-13 MED ORDER — INSULIN GLARGINE-YFGN 100 UNIT/ML ~~LOC~~ SOLN
50.0000 [IU] | Freq: Every day | SUBCUTANEOUS | Status: DC
  Administered 2022-06-13 – 2022-06-14 (×2): 50 [IU] via SUBCUTANEOUS
  Filled 2022-06-13 (×2): qty 0.5

## 2022-06-13 MED ORDER — DM-GUAIFENESIN ER 30-600 MG PO TB12: 1.0000 | ORAL_TABLET | Freq: Two times a day (BID) | ORAL | Status: DC | PRN

## 2022-06-13 MED ORDER — ACETAMINOPHEN 650 MG RE SUPP: 650.0000 mg | RECTAL | Status: DC | PRN

## 2022-06-13 MED ORDER — ACETAMINOPHEN 325 MG PO TABS: 650.0000 mg | ORAL_TABLET | ORAL | Status: DC | PRN

## 2022-06-13 MED ORDER — SENNOSIDES-DOCUSATE SODIUM 8.6-50 MG PO TABS: 1.0000 | ORAL_TABLET | Freq: Every evening | ORAL | Status: DC | PRN

## 2022-06-13 MED ORDER — GARLIC OIL 2 MG PO CAPS: 1000.0000 mg | ORAL_CAPSULE | Freq: Two times a day (BID) | ORAL | Status: DC

## 2022-06-13 MED ORDER — PANTOPRAZOLE SODIUM 40 MG PO TBEC
40.0000 mg | DELAYED_RELEASE_TABLET | Freq: Every day | ORAL | Status: DC
  Administered 2022-06-13 – 2022-06-14 (×2): 40 mg via ORAL
  Filled 2022-06-13 (×2): qty 1

## 2022-06-13 MED ORDER — FLUTICASONE PROPIONATE 50 MCG/ACT NA SUSP: 2.0000 | Freq: Every day | NASAL | Status: DC | PRN

## 2022-06-13 MED ORDER — ENOXAPARIN SODIUM 60 MG/0.6ML IJ SOSY
0.5000 mg/kg | PREFILLED_SYRINGE | INTRAMUSCULAR | Status: DC
  Administered 2022-06-13 – 2022-06-14 (×2): 55 mg via SUBCUTANEOUS
  Filled 2022-06-13 (×2): qty 0.6

## 2022-06-13 NOTE — Evaluation (Signed)
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   Obesity (BMI 30.0-34.9) 01/17/2018   Osteoarthritis of multiple joints 10/04/2017   Asthma 12/30/2016   ACE-inhibitor cough 12/30/2016   Agitation 06/16/2016   Personal history of bladder cancer 05/12/2016   Type 2 diabetes mellitus with peripheral neuropathy ([place]) 12/25/2015   Hyperlipidemia associated with type 2 diabetes mellitus ([place]) 11/02/2015   Asymptomatic bacteriuria 10/08/2015   Essential hypertension 07/29/2015   Urinary obstruction, not elsewhere classified 06/19/2013   Personal history of prostate cancer 12/19/2012   Family history of malignant neoplasm of prostate 12/12/2012   ED (erectile dysfunction) of organic origin 12/12/2012   Incomplete bladder emptying 12/12/2012   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[place]) 12/12/2012   Malignant neoplasm of prostate ([place]) 12/12/2012   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 male who presents to the ED from home with a chief complaint of dizziness/lightheadedness, generalized weakness, unsteadiness on his feet and twinges of pain to his chest throughout the day.  Patient and wife were driving home from [place].  Wife states patient required walking assistance all day due to losing his balance.  Denies fever, headache, vision changes, neck pain, shortness of breath, abdominal pain, nausea or vomiting.  Denies use of anticoagulants given head bleed earlier this year." MRI brain "1. No acute intracranial infarct or other abnormality.  2. Mild to moderate chronic microvascular ischemic disease."   Assessment / Plan / Recommendation Clinical Impression  Pt seen for cognitive-linguistic evaluation. Pt alert, pleasant, and cooperative. Wife at bedside.   PTA, pt and wife report pt being independent with ADLs/iADLs and working full time in maintenance at [employer].   Assessment completed via informal means and portions of Cognistat. Based on today's evaluation, pt demonstrated intact basic speech/language/cognitive ability. Pt and wife deny pt with any notable changes to speech/language/cognition this admission.   SLP to sign off as pt has no acute SLP needs.   Pt, pt's wife, and RN made aware of results, recommendations, and SLP POC. Pt and wife verbalized understanding/agreement.    SLP Assessment  SLP Recommendation/Assessment: Patient does not need any further Speech [place] [facility] SLP Visit Diagnosis: Cognitive communication deficit (R41.841)    Recommendations for follow up therapy are one component of a multi-disciplinary discharge planning process, led by the attending physician.  Recommendations may be  updated based on patient status, additional functional criteria and insurance authorization.    Follow Up Recommendations  No SLP follow up    Assistance Recommended at Discharge   (defer to OT/PT)  Functional Status Assessment  Patient has not had a recent decline in their functional status        SLP Evaluation Cognition  Overall Cognitive Status: Within Functional Limits for tasks assessed Orientation Level: Oriented X4 Memory: Appears intact Awareness: Appears intact Problem Solving: Appears intact Safety/Judgment: Appears intact       Comprehension  Auditory Comprehension Overall Auditory Comprehension: Appears within functional limits for tasks assessed Yes/No Questions: Within Functional Limits Commands: Within Functional Limits Conversation: Complex    Expression Expression Primary Mode of Expression: Verbal Verbal Expression Overall Verbal Expression: Appears within functional limits for tasks assessed Initiation: No impairment Automatic Speech: Social Response (WFL) Level of Generative/Spontaneous Verbalization: Conversation [facility]) Pragmatics: No impairment Written Expression Dominant Hand: Right   Oral / Motor  Oral Motor/Sensory Function Overall Oral Motor/Sensory Function: Within functional limits Motor Speech Overall Motor Speech: Appears within functional limits for tasks assessed Respiration: Within functional limits Phonation: Normal Resonance: Within functional limits Articulation: Within functional limitis Intelligibility: Intelligible Motor Planning: Witnin functional limits Motor Speech Errors: Not applicable           Cherrie Gauze, M.S., [facility] [phone] ([place])  Quintella Baton 06/13/2022, 12:16 PM

## 2022-06-13 NOTE — Progress Notes (Signed)
PHARMACIST - PHYSICIAN ORDER COMMUNICATION  CONCERNING: P&T Medication Policy on Herbal Medications  DESCRIPTION:  This patient's order for:  Garlic Oil 7,510 MG CAPS has been noted.  This product(s) is classified as an "herbal" or natural product. Due to a lack of definitive safety studies or FDA approval, nonstandard manufacturing practices, plus the potential risk of unknown drug-drug interactions while on inpatient medications, the Pharmacy and Therapeutics Committee does not permit the use of "herbal" or natural products of this type within [facility].   ACTION TAKEN: The pharmacy department is unable to verify this order at this time. Please reevaluate patient's clinical condition at discharge and address if the herbal or natural product(s) should be resumed at that time.   Renda Rolls, PharmD, MBA 06/13/2022 2:22 AM

## 2022-06-13 NOTE — Assessment & Plan Note (Signed)
-   IV hydralazine as needed -Amlodipine, Cozaar,

## 2022-06-13 NOTE — Assessment & Plan Note (Signed)
LDL 127 -started Lipitor

## 2022-06-13 NOTE — ED Notes (Signed)
Informed RN bed assigned 

## 2022-06-13 NOTE — Evaluation (Signed)
Physical Therapy Evaluation Patient Details Name: Henry Horne MRN: [id number] DOB: [birth year] Today's Date: 06/13/2022  History of Present Illness  Patient is a 71 year old male who presents from the ED  with a chief complaint of dizziness/lightheadedness, generalized weakness, unsteadiness on his feet and twinges of pain to his chest throughout the day. History of SDH. MRI of brain reports no acute intracranial infarct or other abnormality.   Clinical Impression  Patient is agreeable to PT. He was sleeping on arrival to room and drowsy initially. Spouse at the bedside reports patient has had increased difficulty with ambulation in the past few days related to difficulty sequencing activity. Prior to this, he was independent with mobility without assistive device.  Patient complains of right thigh tightness. He required assistance for trunk support to sit upright but demonstrated good sitting balance. Supervision for standing with mild unsteadiness with standing. Patient did have drop in blood pressure from 142/68 seated, to 113/52 in standing. He reports no dizziness with activity. The patient does not appear to be at his baseline level of functional mobility. Recommend PT follow up to maximize independence and facilitate return to prior level of function. The spouse declined the need for home health at this time.   Orthostatic VS taken during PT session.  BP- Lying Pulse- Lying BP- Sitting Pulse- Sitting BP- Standing at 0 minutes Pulse- Standing at 0 minutes  06/13/22 1500 141/66 87 142/68 93 113/52 90         Recommendations for follow up therapy are one component of a multi-disciplinary discharge planning process, led by the attending physician.  Recommendations may be updated based on patient status, additional functional criteria and insurance authorization.  Follow Up Recommendations Other (comment) (family declined home health PT)      Assistance Recommended at Discharge Set up  Supervision/Assistance  Patient can return home with the following  A little help with walking and/or transfers;A little help with bathing/dressing/bathroom;Help with stairs or ramp for entrance;Assist for transportation    Equipment Recommendations None recommended by PT  Recommendations for Other Services       Functional Status Assessment Patient has had a recent decline in their functional status and demonstrates the ability to make significant improvements in function in a reasonable and predictable amount of time.     Precautions / Restrictions Precautions Precautions: Fall Restrictions Weight Bearing Restrictions: No      Mobility  Bed Mobility Overal bed mobility: Needs Assistance Bed Mobility: Supine to Sit, Sit to Supine     Supine to sit: Mod assist Sit to supine: Supervision   General bed mobility comments: significant trunk assistance required for sitting upright. spouse reports patient has a water bed at home    Transfers Overall transfer level: Needs assistance Equipment used: None Transfers: Sit to/from Stand Sit to Stand: Min guard           General transfer comment: no physical assistance required for standing    Ambulation/Gait Ambulation/Gait assistance: Min guard Gait Distance (Feet): 2 Feet Assistive device: None         General Gait Details: patient able to take several side steps along edge of bed with Min guard assistance. further ambulation deferred due to blood pressure (dropped from 142/68 in sitting to 113/52 in standing). patient reports no dizziness with standing  Stairs            Wheelchair Mobility    Modified Rankin (Stroke Patients Only)       Balance  Overall balance assessment: History of Falls, Needs assistance Sitting-balance support: Feet supported Sitting balance-Leahy Scale: Good     Standing balance support: No upper extremity supported Standing balance-Leahy Scale: Fair Standing balance comment:  Min guard for safety. monitored for signs of dizziness given history of dizziness and blood pressure drop with standing. no dizziness is reported                             Pertinent Vitals/Pain Pain Assessment Pain Assessment: No/denies pain    Home Living Family/patient expects to be discharged to:: Private residence Living Arrangements: Spouse/significant other Available Help at Discharge: Family;Available 24 hours/day Type of Home: Mobile home Home Access: Stairs to enter (per spouse 3 steps to enter)       Home Layout: One level Home Equipment: Conservation officer, nature (2 wheels);BSC/3in1;Tub bench (per spouse report)      Prior Function Prior Level of Function : Independent/Modified Independent;Working/employed             Mobility Comments: independent at baseline, increased assistance required in the past few days due to difficulty sequencing mobility per spouse report. works full time at [employer] ADLs Comments: independent     Journalist, newspaper        Extremity/Trunk Assessment   Upper Extremity Assessment Upper Extremity Assessment: Overall WFL for tasks assessed    Lower Extremity Assessment Lower Extremity Assessment: RLE deficits/detail RLE Deficits / Details: patient reports right thigh cramping/tightness       Communication   Communication: No difficulties  Cognition Arousal/Alertness: Awake/alert Behavior During Therapy: WFL for tasks assessed/performed Overall Cognitive Status: Within Functional Limits for tasks assessed                                 General Comments: patient is able to follow all commands with increased time. he appears sleepy at times initially but with increased alertness with mobility efforts        General Comments      Exercises     Assessment/Plan    PT Assessment Patient needs continued PT services  PT Problem List Decreased activity tolerance;Decreased balance;Decreased mobility;Decreased  safety awareness       PT Treatment Interventions DME instruction;Gait training;Stair training;Functional mobility training;Therapeutic activities;Therapeutic exercise;Balance training;Neuromuscular re-education;Cognitive remediation;Patient/family education    PT Goals (Current goals can be found in the Care Plan section)  Acute Rehab PT Goals Patient Stated Goal: to be able to walk PT Goal Formulation: With patient Time For Goal Achievement: 06/27/22 Potential to Achieve Goals: Fair    Frequency Min 2X/week     Co-evaluation               AM-PAC PT "6 Clicks" Mobility  Outcome Measure Help needed turning from your back to your side while in a flat bed without using bedrails?: A Little Help needed moving from lying on your back to sitting on the side of a flat bed without using bedrails?: A Lot Help needed moving to and from a bed to a chair (including a wheelchair)?: A Little Help needed standing up from a chair using your arms (e.g., wheelchair or bedside chair)?: A Little Help needed to walk in hospital room?: A Little Help needed climbing 3-5 steps with a railing? : A Little 6 Click Score: 17    End of Session   Activity Tolerance: Patient tolerated treatment well Patient left:  in bed;with call bell/phone within reach   PT Visit Diagnosis: Unsteadiness on feet (R26.81);History of falling (Z91.81)    Time: 2998-0699 PT Time Calculation (min) (ACUTE ONLY): 33 min   Charges:   PT Evaluation $PT Eval Moderate Complexity: 1 Mod PT Treatments $Therapeutic Activity: 8-22 mins        Minna Merritts, PT, MPT   Percell Locus 06/13/2022, 3:47 PM

## 2022-06-13 NOTE — H&P (Addendum)
History and Physical    Henry Horne ZJI:[id number] DOB: February 27, 1951 DOA: 06/13/2022  Referring MD/NP/PA:   PCP: Olin Hauser, DO   Patient coming from:  The patient is coming from home.    Chief Complaint: Unsteady gait and dizziness  HPI: Henry Horne is a 71 y.o. male with medical history significant of hypertension, hyperlipidemia, diabetes mellitus, asthma, GERD, depression with anxiety, prostate cancer (s/p cryotherapy), bladder cancer, obesity with  BMI 34.72, subdural hematoma in March due to fall, who presents with unsteady gait and dizziness.  Patient states that he had small subdural hematoma in March after a fall which has resolved.  After that he has been having intermittent dizziness.  Patient was seen by ENT and was told that he has positional vertigo.  Patient was also sent by neurologist in June who did MRI of brain which was negative for stroke.  Patient states that he continues to have intermittent dizziness.  Patient had traveling with his wife. They was driving back last night from [place].  Per his wife, patient developed unsteady gait, could not walk normally on the way driving back when he is out of the car. They came to emergency room directly last night.   Patient does not have unilateral numbness or tingling in extremities.  No facial droop or slurred speech.  No weakness in extremities.  He states that he just feels unsteady when walking.  He has intermittent ear ringing, no hearing loss.  Does not feel room spinning around him. No vision loss.  Patient does not have chest pain or shortness breath.  Patient has chronic dry cough which has not changed.  No nausea, vomiting, diarrhea or abdominal pain.  No symptoms of UTI.   Data reviewed independently and ED Course: pt was found to have WBC 12.8, troponin level 7, 7, UDS positive for THC, negative urinalysis, GFR >60, temperature normal, blood pressure 149/72, heart rate 94, RR 34, 22, oxygen  saturation 96% on room air.  CT of head negative.  MRI of brain is negative for acute intracranial abnormalities.  Patient is placed on telemetry bed for observation.   EKG: I have personally reviewed.  Sinus rhythm, QTc 442, LAD, poor R wave progression   Review of Systems:   General: no fevers, chills, no body weight gain, fatigue HEENT: no blurry vision, hearing changes or sore throat Respiratory: no dyspnea, has dry coughing, no wheezing CV: no chest pain, no palpitations GI: no nausea, vomiting, abdominal pain, diarrhea, constipation GU: no dysuria, burning on urination, increased urinary frequency, hematuria  Ext: no leg edema Neuro: no unilateral weakness, numbness, or tingling, no vision change or hearing loss.  Has ataxia Skin: no rash, no skin tear. MSK: No muscle spasm, no deformity, no limitation of range of movement in spin Heme: No easy bruising.  Travel history: No recent long distant travel.   Allergy:  Allergies  Allergen Reactions   Zetia [Ezetimibe] Other (See Comments)    Joint pain, congestion, muscle ache Joint pain, congestion, muscle ache   Ace Inhibitors Cough   Pneumococcal Vaccine    Pneumococcal Vaccines     Past Medical History:  Diagnosis Date   Anxiety    Cancer of kidney ([facility]) 11/28/2008   cystoscopy 2010, 2014, 2015, 2016- MD q 6 months   Depression    Emphysema of lung ([facility]) 11/28/1994   Hyperlipidemia    Hypertension    Joint pain    Prostate cancer ([facility]) 11/28/2006   removed  with cryotherapy   SDH (subdural hematoma) ([facility])     Past Surgical History:  Procedure Laterality Date   DIGIT NAIL REMOVAL Left 2016   left and right foot -toe nails    Social History:  reports that he quit smoking about 18 years ago. His smoking use included cigarettes. He has quit using smokeless tobacco. He reports that he does not drink alcohol and does not use drugs.  Family History:  Family History  Problem Relation Age of Onset   Diabetes  Mother    Heart disease Father    Cancer Father    Hyperlipidemia Father    Hypertension Father    Diabetes Brother    COPD Brother    Autoimmune disease Brother    Heart disease Brother    Diabetes Brother      Prior to Admission medications   Medication Sig Start Date End Date Taking? Authorizing Provider  amLODipine (NORVASC) 5 MG tablet Take 1 tablet by mouth once daily 05/11/22  Yes Karamalegos, Devonne Doughty, DO  FLUoxetine (PROZAC) 40 MG capsule Take 1 capsule by mouth once daily 04/28/22  Yes Karamalegos, Devonne Doughty, DO  Fluticasone-Salmeterol (ADVAIR) 250-50 MCG/DOSE AEPB Inhale 1 puff into the lungs 2 (two) times daily. 10/01/21  Yes Margarette Canada, NP  Garlic Oil 2 MG CAPS Take 1,000 mg by mouth 2 (two) times daily. Reported on 11/17/2015 12/12/12  Yes [provider]  insulin glargine (LANTUS) 100 UNIT/ML injection INJECT 70 UNITS SUBCUTANEOUSLY ONCE DAILY 04/28/22  Yes Karamalegos, Devonne Doughty, DO  losartan (COZAAR) 50 MG tablet Take 1 tablet (50 mg total) by mouth daily. 05/25/22  Yes Karamalegos, Devonne Doughty, DO  metFORMIN (GLUCOPHAGE) 1000 MG tablet TAKE 1 TABLET BY MOUTH TWICE DAILY WITH  MEALS 05/11/22  Yes Karamalegos, Devonne Doughty, DO  MOUNJARO 5 MG/0.5ML Pen INJECT 5 MG  SUBCUTANEOUSLY ONCE A WEEK 05/23/22  Yes Karamalegos, Devonne Doughty, DO  omeprazole (PRILOSEC) 20 MG capsule Take 20 mg by mouth daily.   Yes [provider]  albuterol (VENTOLIN HFA) 108 (90 Base) MCG/ACT inhaler INHALE 1 TO 2 PUFFS BY MOUTH EVERY 4 HOURS AS NEEDED FOR WHEEZING OR SHORTNESS OF BREATH 02/10/22   Karamalegos, Devonne Doughty, DO  diclofenac sodium (VOLTAREN) 1 % GEL Apply 2 g topically 3 (three) times daily as needed. For knees 04/18/18   Karamalegos, Alexander J, DO  fluticasone (FLONASE) 50 MCG/ACT nasal spray INSTILL 2 SPRAYS INTO BOTH NOSTRIL DAILY. USE FOR 4 TO 6 WEEKS THEN STOP AND USE SEASONALLY OR AS NEEDED. 04/19/22   Olin Hauser, DO    Physical Exam: Vitals:    06/13/22 1030 06/13/22 1130 06/13/22 1330 06/13/22 1632  BP: (!) 150/79 121/63 110/68 (!) 163/80  Pulse: 79 90 89 87  Resp:   (!) 28 18  Temp:   98.7 F (37.1 C) 99.6 F (37.6 C)  TempSrc:   Oral Oral  SpO2: 96% 94% 98% 99%  Weight:      Height:       General: Not in acute distress HEENT:       Eyes: PERRL, EOMI, no scleral icterus.       ENT: No discharge from the ears and nose, no pharynx injection, no tonsillar enlargement.        Neck: No JVD, no bruit, no mass felt. Heme: No neck lymph node enlargement. Cardiac: S1/S2, RRR, No murmurs, No gallops or rubs. Respiratory: No rales, wheezing, rhonchi or rubs. GI: Soft, nondistended, nontender, no rebound pain,  no organomegaly, BS present. GU: No hematuria Ext: No pitting leg edema bilaterally. 1+DP/PT pulse bilaterally. Musculoskeletal: No joint deformities, No joint redness or warmth, no limitation of ROM in spin. Skin: No rashes.  Neuro: Alert, oriented X3, cranial nerves II-XII grossly intact, moves all extremities normally. Muscle strength 5/5 in all extremities, sensation to light touch intact. Brachial reflex 2+ bilaterally. Psych: Patient is not psychotic, no suicidal or hemocidal ideation.  Labs on Admission: I have personally reviewed following labs and imaging studies  CBC: Recent Labs  Lab 06/12/22 2139  WBC 12.8*  HGB 13.2  HCT 41.6  MCV 80.9  PLT 709   Basic Metabolic Panel: Recent Labs  Lab 06/12/22 2139  NA 134*  K 3.7  CL 103  CO2 23  GLUCOSE 198*  BUN 19  CREATININE 1.07  CALCIUM 8.7*   GFR: Estimated Creatinine Clearance: 77.4 mL/min (by C-G formula based on SCr of 1.07 mg/dL). Liver Function Tests: No results for input(s): "AST", "ALT", "ALKPHOS", "BILITOT", "PROT", "ALBUMIN" in the last 168 hours. No results for input(s): "LIPASE", "AMYLASE" in the last 168 hours. No results for input(s): "AMMONIA" in the last 168 hours. Coagulation Profile: No results for input(s): "INR", "PROTIME" in  the last 168 hours. Cardiac Enzymes: No results for input(s): "CKTOTAL", "CKMB", "CKMBINDEX", "TROPONINI" in the last 168 hours. BNP (last 3 results) No results for input(s): "PROBNP" in the last 8760 hours. HbA1C: No results for input(s): "HGBA1C" in the last 72 hours. CBG: Recent Labs  Lab 06/13/22 0823 06/13/22 1136  GLUCAP 176* 176*   Lipid Profile: Recent Labs    06/13/22 0204  CHOL 174  HDL 37*  LDLCALC 120*  TRIG 83  CHOLHDL 4.7   Thyroid Function Tests: No results for input(s): "TSH", "T4TOTAL", "FREET4", "T3FREE", "THYROIDAB" in the last 72 hours. Anemia Panel: No results for input(s): "VITAMINB12", "FOLATE", "FERRITIN", "TIBC", "IRON", "RETICCTPCT" in the last 72 hours. Urine analysis:    Component Value Date/Time   COLORURINE YELLOW 06/13/2022 0530   APPEARANCEUR CLEAR 06/13/2022 0530   LABSPEC 1.010 06/13/2022 0530   PHURINE 5.5 06/13/2022 0530   GLUCOSEU 100 (A) 06/13/2022 0530   HGBUR TRACE (A) 06/13/2022 0530   BILIRUBINUR NEGATIVE 06/13/2022 0530   KETONESUR TRACE (A) 06/13/2022 0530   PROTEINUR NEGATIVE 06/13/2022 0530   NITRITE NEGATIVE 06/13/2022 0530   LEUKOCYTESUR NEGATIVE 06/13/2022 0530   Sepsis Labs: '@LABRCNTIP'$ (procalcitonin:4,lacticidven:4) )No results found for this or any previous visit (from the past 240 hour(s)).   Radiological Exams on Admission: MR Lumbar Spine W Wo Contrast  Result Date: 06/13/2022 CLINICAL DATA:  History of prostate and bladder cancer presenting with ataxia and unsteady gait. EXAM: MRI LUMBAR SPINE WITHOUT AND WITH CONTRAST TECHNIQUE: Multiplanar and multiecho pulse sequences of the lumbar spine were obtained without and with intravenous contrast. CONTRAST:  23m GADAVIST GADOBUTROL 1 MMOL/ML IV SOLN COMPARISON:  CT abdomen/pelvis 09/16/2010 FINDINGS: Segmentation: Standard; the lowest formed disc space is designated L5-S1. Alignment:  There is trace retrolisthesis of L2 on L3 and L3 on L4. Vertebrae: Vertebral body  heights are preserved. Background marrow signal is normal. There is no suspicious marrow signal abnormality. There is degenerative endplate marrow signal abnormality at L4-L5 with minimal enhancement. There is no other marrow enhancement. Conus medullaris and cauda equina: Conus extends to the L1 level. Conus and cauda equina appear normal. There is no abnormal enhancement of the cauda equina nerve roots. Paraspinal and other soft tissues: Unremarkable. Disc levels: Disc desiccation and narrowing is most advanced  at L3-L4 through L5-S1. T12-L1: No significant spinal canal or neural foraminal stenosis L1-L2: There is a small right paracentral broad-based protrusion without significant spinal canal or neural foraminal stenosis L2-L3: There is a mild diffuse disc bulge eccentric to the right and bilateral facet arthropathy resulting in mild spinal canal stenosis and mild right worse than left neural foraminal stenosis. L3-L4: There is a diffuse disc bulge and bilateral facet arthropathy with ligamentum flavum thickening resulting in moderate spinal canal stenosis with effacement of the bilateral subarticular zones and potential irritation of either traversing L4 nerve root and moderate left and mild right neural foraminal stenosis. L4-L5: There is a diffuse disc bulge with a superimposed inferiorly migrated right subarticular zone extrusion (5-7), and moderate bilateral facet arthropathy with ligamentum flavum thickening resulting in moderate to severe spinal canal stenosis with effacement of the subarticular zones and potential irritation of either traversing L5 nerve root (particularly on the right due to the disc extrusion, 5-7), and moderate to severe bilateral neural foraminal stenosis. L5-S1: There is a mild broad-based central protrusion and mild bilateral facet arthropathy. The disc material contacts but does not impinge the traversing left S1 nerve root. There is no significant spinal canal or neural foraminal  stenosis. IMPRESSION: 1. No evidence of metastatic disease in the lumbar spine. 2. Multifactorial moderate spinal canal stenosis and moderate left neural foraminal stenosis at L3-L4 with potential irritation either traversing L4 nerve root. 3. 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 4. Small disc protrusion at L5-S1 which contacts but does not impinge the traversing left S1 nerve root. Electronically Signed   By: Valetta Mole M.D.   On: 06/13/2022 13:06   MR BRAIN WO CONTRAST  Result Date: 06/13/2022 CLINICAL DATA:  Initial evaluation for neuro deficit, stroke suspected. EXAM: MRI HEAD WITHOUT CONTRAST TECHNIQUE: Multiplanar, multiecho pulse sequences of the brain and surrounding structures were obtained without intravenous contrast. COMPARISON:  Prior CT from 06/12/2022. FINDINGS: Brain: Cerebral volume within normal limits for age. Patchy T2/FLAIR hyperintensity involving the periventricular deep white matter both cerebral hemispheres as well as the pons, most consistent with chronic small vessel ischemic disease, mild-to-moderate in nature. No evidence for acute or subacute ischemia. Gray-white matter differentiation maintained. No encephalomalacia to suggest chronic cortical infarction. No acute or chronic intracranial blood products. No mass lesion, mass effect, or midline shift. No hydrocephalus or extra-axial fluid collection. Pituitary gland and suprasellar region within normal limits. Vascular: Major intracranial vascular flow voids are maintained. Skull and upper cervical spine: Craniocervical junction within normal limits. Bone marrow signal intensity normal. No scalp soft tissue abnormality. Sinuses/Orbits: Globes orbital soft tissues within  normal limits. Retention cyst present at the right frontal and maxillary sinuses. No significant mastoid effusion. Other: None. IMPRESSION: 1. No acute intracranial infarct or other abnormality. 2. Mild to moderate chronic microvascular ischemic disease. Electronically Signed   By: Jeannine Boga M.D.   On: 06/13/2022 04:48   CT Head Wo Contrast  Result Date: 06/13/2022 CLINICAL DATA:  Mental status change, unknown cause Dizziness and weakness. EXAM: CT HEAD WITHOUT CONTRAST TECHNIQUE: Contiguous axial images were obtained from the base of the skull through the vertex without intravenous contrast. RADIATION DOSE REDUCTION: This exam was performed according to the departmental dose-optimization program which includes automated exposure control, adjustment of  the mA and/or kV according to patient size and/or use of iterative reconstruction technique. COMPARISON:  Head CT 03/01/2022 FINDINGS: Brain: No acute intracranial hemorrhage or subdural collection. Stable atrophy and ventriculomegaly. Stable periventricular chronic small vessel ischemia. No evidence of acute infarct. No midline shift or mass effect. Vascular: Atherosclerosis of skullbase vasculature without hyperdense vessel or abnormal calcification. Skull: No fracture or focal lesion. Sinuses/Orbits: Stable mucous retention cyst in the right frontal sinus. Trace mucosal thickening of the right maxillary sinus, improved from prior exam. Improved ethmoid air cell mucosal thickening. No mastoid effusion. Other: None. IMPRESSION: 1. No acute intracranial abnormality. 2. Stable atrophy and chronic small vessel ischemia. Electronically Signed   By: Keith Rake M.D.   On: 06/13/2022 00:00   DG Chest 2 View  Result Date: 06/12/2022 CLINICAL DATA:  Chest pain, generalized weakness EXAM: CHEST - 2 VIEW COMPARISON:  01/30/2022 FINDINGS: Frontal and lateral views of the chest demonstrate an unremarkable cardiac silhouette. No acute airspace disease,  effusion, or pneumothorax. No acute bony abnormalities. IMPRESSION: 1. No acute intrathoracic process. Electronically Signed   By: Randa Ngo M.D.   On: 06/12/2022 21:54      Assessment/Plan Principal Problem:   Ataxia Active Problems:   Essential hypertension   Type 2 diabetes mellitus with peripheral neuropathy ([facility]CC)   Asthma   Depression with anxiety   Leukocytosis   Obesity with body mass index (BMI) of 30.0 to 39.9   HLD (hyperlipidemia)   Assessment and Plan: * Ataxia Etiology is not clear.  CT of head and MRI of the brain negative for acute intracranial issues.  Patient reports ear ringing, indicating possible peripheral vertigo.  Since patient has history of positive cancer, will need to rule out metastasized disease to the spinal cord.  -Placed on telemetry bed for patient -Fall precaution -PT/OT -Check orthostatic vital sign -MRI-lumbar spine -check Vb12 level -prn meclizine  Addendum: MRI-L spin showed multifocal severe stenosis -Consulted Dr. Cari Caraway for neurosurgery  Essential hypertension - IV hydralazine as needed -Amlodipine, Cozaar,  Type 2 diabetes mellitus with peripheral neuropathy (HCC) Recent A1c 8.9, poorly controlled.  Patient is taking metformin and glargine insulin 70 units daily -Glargine insulin 50 units daily -Sliding scale insulin  Asthma - Stable -As needed albuterol and Mucinex  Depression with anxiety - Continue home medications  Leukocytosis WBC 12.8, no signs of infection, likely reactive -Follow-up by CBC  Obesity with body mass index (BMI) of 30.0 to 39.9  BMI= 34.72   and BW= 109.8 -Diet and exercise.   -Encouraged to lose weight.   HLD (hyperlipidemia) LDL 127 -started Lipitor           DVT ppx: SQ Lovenox  Code Status: Full code  Family Communication:  Yes, patient's  wife  at bed side.      Disposition Plan:  Anticipate discharge back to previous environment  Consults called:   none  Admission status and Level of care: Med-Surg:   for obs    Severity of Illness:  The appropriate patient status for this patient is OBSERVATION. Observation status is judged to be reasonable and necessary in order to provide the required intensity of service to ensure the patient's safety. The patient's presenting symptoms, physical exam findings, and initial radiographic and laboratory data in the context of their medical condition is felt to place them at decreased risk for further clinical deterioration. Furthermore, it is anticipated that the patient will be medically stable for discharge from the hospital within 2 midnights  of admission.        Date of Service 06/13/2022    Ivor Costa [facility]   If 7PM-7AM, please contact night-coverage [email or web address] 06/13/2022, 7:10 PM

## 2022-06-13 NOTE — ED Notes (Signed)
PT began coughing after chewing aspirin. Wife reports he has swallowing issues all of the time and this is not new. She has expressed concern about this in the past. He was sat up and eventually stopped coughing. When attempting to swallow water for screening, he held it in his mouth and could not drink whole amount.

## 2022-06-13 NOTE — ED Notes (Signed)
Patient transported to MRI 

## 2022-06-13 NOTE — Assessment & Plan Note (Signed)
  BMI= 34.72   and BW= 109.8 -Diet and exercise.   -Encouraged to lose weight.

## 2022-06-13 NOTE — Assessment & Plan Note (Signed)
-   Stable -As needed albuterol and Mucinex

## 2022-06-13 NOTE — Evaluation (Signed)
Occupational Therapy Evaluation Patient Details Name: Henry Horne MRN: [id number] DOB: 11/13/1951 Today's Date: 06/13/2022   History of Present Illness 71 y.o. male who presents to the ED from home with a chief complaint of dizziness/lightheadedness, generalized weakness, unsteadiness on his feet and twinges of pain to his chest throughout the day.  Patient and wife were driving home from [place].  Wife states patient required walking assistance all day due to losing his balance.   Clinical Impression   Patient presenting with decreased Ind in self care, balance, functional mobility/transfers, endurance, and safety awareness. Patient reports being independent at baseline and living with wife. He works at [employer] in [employer]. Pt endorses feeling much weaker recently and wife being present to assist him with safely ambulating prior to hospital admission.  Patient currently functioning at min guard progressing to supervision for ambulation and self care tasks. Orthostatic BP's were taken as pt reports feeling dizzy once seated on EOB.  Patient will benefit from acute OT to increase overall independence in the areas of ADLs, functional mobility, and safety awareness in order to safely discharge home.   Othostatics: Supine: 140/79 (97) EOB: 130/79 (93) Standing: 138/79(93)     Recommendations for follow up therapy are one component of a multi-disciplinary discharge planning process, led by the attending physician.  Recommendations may be updated based on patient status, additional functional criteria and insurance authorization.   Follow Up Recommendations  No OT follow up    Assistance Recommended at Discharge Intermittent Supervision/Assistance  Patient can return home with the following A little help with bathing/dressing/bathroom;Help with stairs or ramp for entrance    Functional Status Assessment  Patient has had a recent decline in their functional status and demonstrates the  ability to make significant improvements in function in a reasonable and predictable amount of time.  Equipment Recommendations  None recommended by OT       Precautions / Restrictions Precautions Precautions: Fall Precaution Comments: moderate fall risk      Mobility Bed Mobility Overal bed mobility: Needs Assistance Bed Mobility: Supine to Sit, Sit to Supine     Supine to sit: Supervision Sit to supine: Supervision   General bed mobility comments: increased time and effort but able to perform without physical assistance    Transfers Overall transfer level: Needs assistance Equipment used: None Transfers: Sit to/from Stand, Bed to chair/wheelchair/BSC Sit to Stand: Min guard, Supervision     Step pivot transfers: Supervision     General transfer comment: min guard initally to stand progressing to supervision      Balance Overall balance assessment: History of Falls, Needs assistance Sitting-balance support: Feet supported Sitting balance-Leahy Scale: Good     Standing balance support: During functional activity Standing balance-Leahy Scale: Fair                             ADL either performed or assessed with clinical judgement   ADL Overall ADL's : Needs assistance/impaired                                       General ADL Comments: Pt initially needing min guard when getting off stretcher but progressing to supervision for self care tasks and household ambulation without use of AD.     Vision Patient Visual Report: No change from baseline  Pertinent Vitals/Pain Pain Assessment Pain Assessment: No/denies pain     Hand Dominance Right   Extremity/Trunk Assessment Upper Extremity Assessment Upper Extremity Assessment: Overall WFL for tasks assessed   Lower Extremity Assessment Lower Extremity Assessment: Overall WFL for tasks assessed       Communication Communication Communication: No difficulties    Cognition Arousal/Alertness: Awake/alert Behavior During Therapy: WFL for tasks assessed/performed Overall Cognitive Status: Within Functional Limits for tasks assessed                                                  Home Living Family/patient expects to be discharged to:: Private residence Living Arrangements: Spouse/significant other Available Help at Discharge: Family;Available 24 hours/day Type of Home: Mobile home Home Access: Ramped entrance   Entrance Stairs-Rails: Right;Left Home Layout: One level     Bathroom Shower/Tub: Occupational psychologist: Standard     Home Equipment: None          Prior Functioning/Environment Prior Level of Function : Independent/Modified Independent;Working/employed             Mobility Comments: Pt reports Ind at baseline but has needed some assistance from wife the last few days ADLs Comments: Ind at baseline for self care and IADLs. Pt works at [employer] in [employer].        OT Problem List: Decreased activity tolerance;Impaired balance (sitting and/or standing);Decreased safety awareness      OT Treatment/Interventions: Self-care/ADL training;Therapeutic exercise;Patient/family education;Balance training;Energy conservation;Therapeutic activities    OT Goals(Current goals can be found in the care plan section) Acute Rehab OT Goals Patient Stated Goal: to get better OT Goal Formulation: With patient Time For Goal Achievement: 06/27/22 Potential to Achieve Goals: Good ADL Goals Pt Will Perform Grooming: Independently Pt Will Perform Lower Body Dressing: Independently Pt Will Transfer to Toilet: Independently Pt Will Perform Toileting - Clothing Manipulation and hygiene: Independently;sit to/from stand  OT Frequency: Min 2X/week       AM-PAC OT "6 Clicks" Daily Activity     Outcome Measure Help from another person eating meals?: None Help from another person taking care of personal  grooming?: None Help from another person toileting, which includes using toliet, bedpan, or urinal?: A Little Help from another person bathing (including washing, rinsing, drying)?: A Little Help from another person to put on and taking off regular upper body clothing?: None Help from another person to put on and taking off regular lower body clothing?: A Little 6 Click Score: 21   End of Session    Activity Tolerance: Patient tolerated treatment well Patient left: in bed;with call bell/phone within reach  OT Visit Diagnosis: Muscle weakness (generalized) (M62.81);History of falling (Z91.81)                Time: 9390-3009 OT Time Calculation (min): 18 min Charges:  OT General Charges $OT Visit: 1 Visit OT Evaluation $OT Eval Moderate Complexity: 1 Mod OT Treatments $Therapeutic Activity: 8-22 mins  Darleen Crocker, MS, OTR/L , CBIS ascom [phone]  06/13/22, 11:40 AM

## 2022-06-13 NOTE — Assessment & Plan Note (Signed)
WBC 12.8, no signs of infection, likely reactive -Follow-up by CBC

## 2022-06-13 NOTE — Progress Notes (Signed)
PHARMACIST - PHYSICIAN COMMUNICATION  CONCERNING:  Enoxaparin (Lovenox) for DVT Prophylaxis    RECOMMENDATION: Patient was prescribed enoxaprin '40mg'$  q24 hours for VTE prophylaxis.   Filed Weights   06/12/22 2135  Weight: 109.8 kg (242 lb)    Body mass index is 34.72 kg/m.  Estimated Creatinine Clearance: 77.4 mL/min (by C-G formula based on SCr of 1.07 mg/dL).   Based on [place] patient is candidate for enoxaparin 0.'5mg'$ /kg TBW SQ every 24 hours based on BMI being >30.  DESCRIPTION: Pharmacy has adjusted enoxaparin dose per [facility] policy.  Patient is now receiving enoxaparin 0.5 mg/kg every 24 hours   Renda Rolls, PharmD, [facility] 06/13/2022 2:19 AM

## 2022-06-13 NOTE — Assessment & Plan Note (Signed)
Recent A1c 8.9, poorly controlled.  Patient is taking metformin and glargine insulin 70 units daily -Glargine insulin 50 units daily -Sliding scale insulin

## 2022-06-13 NOTE — Assessment & Plan Note (Signed)
-   Continue home medications 

## 2022-06-13 NOTE — Assessment & Plan Note (Addendum)
Etiology is not clear.  CT of head and MRI of the brain negative for acute intracranial issues.  Patient reports ear ringing, indicating possible peripheral vertigo.  Since patient has history of positive cancer, will need to rule out metastasized disease to the spinal cord.  -Placed on telemetry bed for patient -Fall precaution -PT/OT -Check orthostatic vital sign -MRI-lumbar spine -check Vb12 level -prn meclizine  Addendum: MRI-L spin showed multifocal severe stenosis -Consulted Dr. Cari Caraway for neurosurgery

## 2022-06-13 NOTE — ED Provider Notes (Signed)
[facility] Provider Note    Event Date/Time   First MD Initiated Contact with Patient 06/13/22 0140     (approximate)   History   Dizziness   HPI  Henry Horne is a 71 y.o. male who presents to the ED from home with a chief complaint of dizziness/lightheadedness, generalized weakness, unsteadiness on his feet and twinges of pain to his chest throughout the day.  Patient and wife were driving home from [place].  Wife states patient required walking assistance all day due to losing his balance.  Denies fever, headache, vision changes, neck pain, shortness of breath, abdominal pain, nausea or vomiting.  Denies use of anticoagulants given head bleed earlier this year.     Past Medical History   Past Medical History:  Diagnosis Date  . Anxiety   . Cancer of kidney ([place]) 2010   cystoscopy 2010, 2014, 2015, 2016- MD q 6 months  . Depression   . Emphysema of lung ([place]) 1996  . Hyperlipidemia   . Hypertension   . Joint pain   . Prostate cancer [facility]) 2008   removed with cryotherapy     Active Problem List   Patient Active Problem List   Diagnosis Date Noted  . Dizziness 05/20/2022  . DM type 2 with diabetic peripheral neuropathy ([place]) 05/20/2022  . 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 Obesity (BMI 30.0-34.9) 01/17/2018  . Osteoarthritis of multiple joints 10/04/2017  . Asthma 12/30/2016  . ACE-inhibitor cough 12/30/2016  . Agitation 06/16/2016  . Personal history of bladder cancer 05/12/2016  . Type 2 diabetes mellitus with peripheral neuropathy ([place]) 12/25/2015  . Hyperlipidemia associated with type 2 diabetes mellitus ([place]) 11/02/2015  . Asymptomatic bacteriuria 10/08/2015  . Essential hypertension 07/29/2015  . Urinary obstruction, not elsewhere classified 06/19/2013  . Personal history of prostate cancer 12/19/2012  . Family history of malignant neoplasm of prostate 12/12/2012  . ED (erectile dysfunction) of organic origin 12/12/2012  .  Incomplete bladder emptying 12/12/2012  . 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 Malignant neoplasm of prostate ([place]) 12/12/2012  . 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 Centrilobular emphysema ([place]) 11/28/1994     Past Surgical History   Past Surgical History:  Procedure Laterality Date  . DIGIT NAIL REMOVAL Left 2016   left and right foot -toe nails     Home Medications   Prior to Admission medications   Medication Sig Start Date End Date Taking? Authorizing Provider  albuterol (VENTOLIN HFA) 108 (90 Base) MCG/ACT inhaler INHALE 1 TO 2 PUFFS BY MOUTH EVERY 4 HOURS AS NEEDED FOR WHEEZING OR SHORTNESS OF BREATH 02/10/22   Parks Ranger, Devonne Doughty, DO  amLODipine (NORVASC) 5 MG tablet Take 1 tablet by mouth once daily 05/11/22   Parks Ranger, Devonne Doughty, DO  Continuous Blood Gluc Receiver (FREESTYLE LIBRE 14 DAY READER) DEVI USE 1 EACH EVERY 14 DAYS. USE TO CHECK BLOOD SUGAR. 03/16/22   Karamalegos, Devonne Doughty, DO  Continuous Blood Gluc Sensor (FREESTYLE LIBRE 14 DAY SENSOR) MISC USE TO CHECK BLOOD SUGAR AS NEEDED EVERY 14 DAYS 02/15/22   Karamalegos, Devonne Doughty, DO  diclofenac sodium (VOLTAREN) 1 % GEL Apply 2 g topically 3 (three) times daily as needed. For knees 04/18/18   Karamalegos, Devonne Doughty, DO  FLUoxetine (PROZAC) 40 MG capsule Take 1 capsule by mouth once daily 04/28/22   Karamalegos, Alexander J, DO  fluticasone (FLONASE) 50 MCG/ACT nasal spray INSTILL 2 SPRAYS INTO BOTH NOSTRIL DAILY. USE FOR 4  TO 6 WEEKS THEN STOP AND USE SEASONALLY OR AS NEEDED. 04/19/22   Parks Ranger, Devonne Doughty, DO  Fluticasone-Salmeterol (ADVAIR) 250-50 MCG/DOSE AEPB Inhale 1 puff into the lungs 2 (two) times daily. 10/01/21   Margarette Canada, NP  Garlic Oil 2 MG CAPS Take 1,000 mg by mouth 2 (two) times daily. Reported on 11/17/2015 12/12/12   [provider]  glucose blood (TRUETEST TEST) test strip 1 each by Other route 2 (two) times daily. Use as instructed 10/03/16    Arlis Porta., MD  imipramine (TOFRANIL) 10 MG tablet Take 1 tablet (10 mg total) by mouth at bedtime. 03/25/21   Karamalegos, Devonne Doughty, DO  insulin glargine (LANTUS) 100 UNIT/ML injection INJECT 70 UNITS SUBCUTANEOUSLY ONCE DAILY 04/28/22   Karamalegos, Devonne Doughty, DO  Insulin Pen Needle (NOVOFINE) 30G X 8 MM MISC Use 1 nweedle to inject Victoza once daily. 10/03/16   Arlis Porta., MD  Insulin Pen Needle 31G X 5 MM MISC 1 each by Does not apply route 3 (three) times daily. 10/03/16   Arlis Porta., MD  Insulin Pen Needle 31G X 8 MM MISC Use as directed 11/29/17   Olin Hauser, DO  Insulin Syringe-Needle U-100 31G X 5/16" 1 ML MISC 100 each by Does not apply route 2 (two) times daily. 12/06/17   Karamalegos, Devonne Doughty, DO  losartan (COZAAR) 50 MG tablet Take 1 tablet (50 mg total) by mouth daily. 05/25/22   Karamalegos, Devonne Doughty, DO  metFORMIN (GLUCOPHAGE) 1000 MG tablet TAKE 1 TABLET BY MOUTH TWICE DAILY WITH  MEALS 05/11/22   Parks Ranger, Devonne Doughty, DO  MOUNJARO 5 MG/0.5ML Pen INJECT 5 MG  SUBCUTANEOUSLY ONCE A WEEK 05/23/22   Karamalegos, Devonne Doughty, DO  omeprazole (PRILOSEC) 20 MG capsule Take 20 mg by mouth daily.    [provider]  traMADol (ULTRAM) 50 MG tablet Take 1 tablet (50 mg total) by mouth every 8 (eight) hours as needed. 11/04/21   Parks Ranger, Devonne Doughty, DO  TRUEPLUS LANCETS 30G MISC Use 1 lancet  to check Blood sugar twice daily 10/03/16   Arlis Porta., MD     Allergies  Zetia [ezetimibe], Ace inhibitors, Pneumococcal vaccine, and Pneumococcal vaccines   Family History   Family History  Problem Relation Age of Onset  . Diabetes Mother   . Heart disease Father   . Cancer Father   . Hyperlipidemia Father   . Hypertension Father   . Diabetes Brother   . COPD Brother   . Autoimmune disease Brother   . Heart disease Brother   . Diabetes Brother      Physical Exam  Triage Vital Signs: ED Triage Vitals  Enc Vitals  Group     BP 06/12/22 2133 (!) 157/82     Pulse Rate 06/12/22 2133 90     Resp 06/12/22 2133 20     Temp 06/12/22 2133 98.9 F (37.2 C)     Temp Source 06/12/22 2133 Oral     SpO2 06/12/22 2133 96 %     Weight 06/12/22 2135 242 lb (109.8 kg)     Height 06/12/22 2135 '5\' 10"'$  (1.778 m)     Head Circumference --      Peak Flow --      Pain Score 06/12/22 2134 5     Pain Loc --      Pain Edu? --      Excl. in [place]? --     Updated  Vital Signs: BP (!) 161/85   Pulse 84   Temp 98.4 F (36.9 C) (Oral)   Resp 17   Ht '5\' 10"'$  (1.778 m)   Wt 109.8 kg   SpO2 98%   BMI 34.72 kg/m    General: Awake, no distress.  CV:  RRR.  Good peripheral perfusion.  Resp:  Normal effort.  CTAB. Abd:  Nontender.  No distention.  Other:  Alert and oriented x3.  CN II toXII grossly intact.  5/5 motor strength and sensation all extremities.  No carotid bruits.  Patient does require heavy assistance to move from wheelchair to stretcher.   ED Results / Procedures / Treatments  Labs (all labs ordered are listed, but only abnormal results are displayed) Labs Reviewed  BASIC METABOLIC PANEL - Abnormal; Notable for the following components:      Result Value   Sodium 134 (*)    Glucose, Bld 198 (*)    Calcium 8.7 (*)    All other components within normal limits  CBC - Abnormal; Notable for the following components:   WBC 12.8 (*)    MCH 25.7 (*)    All other components within normal limits  URINE DRUG SCREEN, QUALITATIVE ([facility] ONLY)  URINALYSIS, ROUTINE W REFLEX MICROSCOPIC  LIPID PANEL  TROPONIN I (HIGH SENSITIVITY)  TROPONIN I (HIGH SENSITIVITY)  TROPONIN I (HIGH SENSITIVITY)     EKG  ED ECG REPORT I, Garland Smouse J, the attending physician, personally viewed and interpreted this ECG.   Date: 06/13/2022  EKG Time: 2132  Rate: 90  Rhythm: normal sinus rhythm  Axis: LAD  Intervals:none  ST&T Change: Nonspecific    RADIOLOGY I have independently visualized and interpreted patient's  chest x-ray and CT scan as well as noted the radiology interpretation:  CT head: No ICH  Chest x-ray: No acute cardiopulmonary process  Official radiology report(s): CT Head Wo Contrast  Result Date: 06/13/2022 CLINICAL DATA:  Mental status change, unknown cause Dizziness and weakness. EXAM: CT HEAD WITHOUT CONTRAST TECHNIQUE: Contiguous axial images were obtained from the base of the skull through the vertex without intravenous contrast. RADIATION DOSE REDUCTION: This exam was performed according to the departmental dose-optimization program which includes automated exposure control, adjustment of the mA and/or kV according to patient size and/or use of iterative reconstruction technique. COMPARISON:  Head CT 03/01/2022 FINDINGS: Brain: No acute intracranial hemorrhage or subdural collection. Stable atrophy and ventriculomegaly. Stable periventricular chronic small vessel ischemia. No evidence of acute infarct. No midline shift or mass effect. Vascular: Atherosclerosis of skullbase vasculature without hyperdense vessel or abnormal calcification. Skull: No fracture or focal lesion. Sinuses/Orbits: Stable mucous retention cyst in the right frontal sinus. Trace mucosal thickening of the right maxillary sinus, improved from prior exam. Improved ethmoid air cell mucosal thickening. No mastoid effusion. Other: None. IMPRESSION: 1. No acute intracranial abnormality. 2. Stable atrophy and chronic small vessel ischemia. Electronically Signed   By: Keith Rake M.D.   On: 06/13/2022 00:00   DG Chest 2 View  Result Date: 06/12/2022 CLINICAL DATA:  Chest pain, generalized weakness EXAM: CHEST - 2 VIEW COMPARISON:  01/30/2022 FINDINGS: Frontal and lateral views of the chest demonstrate an unremarkable cardiac silhouette. No acute airspace disease, effusion, or pneumothorax. No acute bony abnormalities. IMPRESSION: 1. No acute intrathoracic process. Electronically Signed   By: Randa Ngo M.D.   On:  06/12/2022 21:54     PROCEDURES:  Critical Care performed: Yes, see critical care procedure note(s)  CRITICAL CARE Performed by:  Kalyna Paolella J   Total critical care time: 30 minutes  Critical care time was exclusive of separately billable procedures and treating other patients.  Critical care was necessary to treat or prevent imminent or life-threatening deterioration.  Critical care was time spent personally by me on the following activities: development of treatment plan with patient and/or surrogate as well as nursing, discussions with consultants, evaluation of patient's response to treatment, examination of patient, obtaining history from patient or surrogate, ordering and performing treatments and interventions, ordering and review of laboratory studies, ordering and review of radiographic studies, pulse oximetry and re-evaluation of patient's condition.  Marland Kitchen1-3 Lead EKG Interpretation  Performed by: Paulette Blanch, MD Authorized by: Paulette Blanch, MD     Interpretation: normal     ECG rate:  85   ECG rate assessment: normal     Rhythm: sinus rhythm     Ectopy: none     Conduction: normal   Comments:     Patient placed on cardiac monitor to evaluate for arrhythmias  NIH Stroke Scale  Interval: Baseline Time: 2:15 AM Person Administering Scale: Yashvi Jasinski J  Administer stroke scale items in the order listed. Record performance in each category after each subscale exam. Do not go back and change scores. Follow directions provided for each exam technique. Scores should reflect what the patient does, not what the clinician thinks the patient can do. The clinician should record answers while administering the exam and work quickly. Except where indicated, the patient should not be coached (i.e., repeated requests to patient to make a special effort).   1a  Level of consciousness: 0=alert; keenly responsive  1b. LOC questions:  0=Performs both tasks correctly  1c. LOC commands:  0=Performs both tasks correctly  2.  Best Gaze: 0=normal  3.  Visual: 0=No visual loss  4. Facial Palsy: 0=Normal symmetric movement  5a.  Motor left arm: 0=No drift, limb holds 90 (or 45) degrees for full 10 seconds  5b.  Motor right arm: 0=No drift, limb holds 90 (or 45) degrees for full 10 seconds  6a. motor left leg: 0=No drift, limb holds 90 (or 45) degrees for full 10 seconds  6b  Motor right leg:  0=No drift, limb holds 90 (or 45) degrees for full 10 seconds  7. Limb Ataxia: 0=Absent  8.  Sensory: 0=Normal; no sensory loss  9. Best Language:  0=No aphasia, normal  10. Dysarthria: 0=Normal  11. Extinction and Inattention: 0=No abnormality  12. Distal motor function: 0=Normal   Total:   0     MEDICATIONS ORDERED IN ED: Medications   stroke: early stages of recovery book (has no administration in time range)  0.9 %  sodium chloride infusion (has no administration in time range)  acetaminophen (TYLENOL) tablet 650 mg (has no administration in time range)    Or  acetaminophen (TYLENOL) 160 MG/5ML solution 650 mg (has no administration in time range)    Or  acetaminophen (TYLENOL) suppository 650 mg (has no administration in time range)  senna-docusate (Senokot-S) tablet 1 tablet (has no administration in time range)  enoxaparin (LOVENOX) injection 40 mg (has no administration in time range)  aspirin EC tablet 81 mg (has no administration in time range)  atorvastatin (LIPITOR) tablet 20 mg (has no administration in time range)  amLODipine (NORVASC) tablet 5 mg (has no administration in time range)  losartan (COZAAR) tablet 50 mg (has no administration in time range)  FLUoxetine (PROZAC) capsule 40 mg (has no administration in  time range)  insulin glargine-yfgn (SEMGLEE) injection 70 Units (has no administration in time range)  pantoprazole (PROTONIX) EC tablet 40 mg (has no administration in time range)  Garlic Oil CAPS 3,762 mg (has no administration in time range)  albuterol  (VENTOLIN HFA) 108 (90 Base) MCG/ACT inhaler 2 puff (has no administration in time range)  fluticasone (FLONASE) 50 MCG/ACT nasal spray 2 spray (has no administration in time range)  aspirin chewable tablet 324 mg (324 mg Oral Given 06/13/22 0210)  nitroGLYCERIN (NITROGLYN) 2 % ointment 1 inch (1 inch Topical Given 06/13/22 0211)     IMPRESSION / MDM / ASSESSMENT AND PLAN / ED COURSE  I reviewed the triage vital signs and the nursing notes.                             71 year old male presenting with dizziness, generalized weakness, ataxia and chest pain. Differential diagnosis includes, but is not limited to, CVA/TIA, ICH, ACS, aortic dissection, pulmonary embolism, cardiac tamponade, pneumothorax, pneumonia, pericarditis, myocarditis, GI-related causes including esophagitis/gastritis, and musculoskeletal chest wall pain.   I have personally reviewed patient's records and note his neurology office visit from 05/20/2022.  Patient's presentation is most consistent with acute presentation with potential threat to life or bodily function.  The patient is on the cardiac monitor to evaluate for evidence of arrhythmia and/or significant heart rate changes.  Laboratory results demonstrate mild leukocytosis WBC 12.8, normal electrolytes, initial troponin unremarkable.  Chest x-ray and CT head unremarkable.  Will administer baby aspirin, nitroglycerin paste.  Will consult hospitalist services for evaluation and admission for chest pain rule out and stroke work-up.  Clinical Course as of 06/13/22 0215  Mon Jun 13, 2022  0215 Failed his swallow screen. [JS]    Clinical Course User Index [JS] Paulette Blanch, MD     FINAL CLINICAL IMPRESSION(S) / ED DIAGNOSES   Final diagnoses:  Dizziness  Chest pain, unspecified type  Ataxia     Rx / DC Orders   ED Discharge Orders     None        Note:  This document was prepared using Dragon voice recognition software and may include unintentional  dictation errors.   Paulette Blanch, MD 06/13/22 [phone]

## 2022-06-14 ENCOUNTER — Observation Stay: Payer: BC Managed Care – PPO

## 2022-06-14 DIAGNOSIS — G959 Disease of spinal cord, unspecified: Secondary | ICD-10-CM

## 2022-06-14 DIAGNOSIS — M4712 Other spondylosis with myelopathy, cervical region: Secondary | ICD-10-CM | POA: Diagnosis not present

## 2022-06-14 DIAGNOSIS — M5124 Other intervertebral disc displacement, thoracic region: Secondary | ICD-10-CM | POA: Diagnosis not present

## 2022-06-14 DIAGNOSIS — R262 Difficulty in walking, not elsewhere classified: Secondary | ICD-10-CM | POA: Diagnosis not present

## 2022-06-14 DIAGNOSIS — M47812 Spondylosis without myelopathy or radiculopathy, cervical region: Secondary | ICD-10-CM | POA: Diagnosis not present

## 2022-06-14 DIAGNOSIS — R270 Ataxia, unspecified: Secondary | ICD-10-CM | POA: Diagnosis not present

## 2022-06-14 DIAGNOSIS — M48062 Spinal stenosis, lumbar region with neurogenic claudication: Secondary | ICD-10-CM

## 2022-06-14 DIAGNOSIS — M2578 Osteophyte, vertebrae: Secondary | ICD-10-CM | POA: Diagnosis not present

## 2022-06-14 LAB — GLUCOSE, CAPILLARY
Glucose-Capillary: 186 mg/dL — ABNORMAL HIGH (ref 70–99)
Glucose-Capillary: 212 mg/dL — ABNORMAL HIGH (ref 70–99)
Glucose-Capillary: 293 mg/dL — ABNORMAL HIGH (ref 70–99)
Glucose-Capillary: 161 mg/dL — ABNORMAL HIGH (ref 70–99)

## 2022-06-14 NOTE — Plan of Care (Signed)

## 2022-06-14 NOTE — Consult Note (Signed)
Referring Physician:  No referring provider defined for this encounter.  Primary Physician:  Henry Hauser, DO  History of Present Illness: 06/14/2022 [name] is here today with a chief complaint of instability walking. He previously had no issues until he developed an unsteady gait over the past couple of weeks.  He had a fall in March when he had a subdural hematoma, but that has resolved.    He and his wife report that he has had worsening difficulty with walking over the past week.  It got to the point that he was not able to ambulate normally to the restroom, ultimately prompting ER visit.  He does have some discomfort into his R leg in the anterior thigh, but his primary issue is a shuffling gait and instability on his feet.  He denies dexterity changes in his hands, or tingling and numbness in his hands or legs.  He does sometimes lean over and use a cart to help him walk.  Review of Systems:  A 10 point review of systems is negative, except for the pertinent positives and negatives detailed in the HPI.  Past Medical History: Past Medical History:  Diagnosis Date   Anxiety    Cancer of kidney ([place]) 11/28/2008   cystoscopy 2010, 2014, 2015, 2016- MD q 6 months   Depression    Emphysema of lung ([place]) 11/28/1994   Hyperlipidemia    Hypertension    Joint pain    Prostate cancer ([place]) 11/28/2006   removed with cryotherapy   SDH (subdural hematoma) ([place])     Past Surgical History: Past Surgical History:  Procedure Laterality Date   DIGIT NAIL REMOVAL Left 2016   left and right foot -toe nails    Allergies: Allergies as of 06/12/2022 - Review Complete 06/12/2022  Allergen Reaction Noted   Zetia [ezetimibe] Other (See Comments) 05/11/2016   Ace inhibitors Cough 06/29/2017   Pneumococcal vaccine  06/16/2015   Pneumococcal vaccines  02/16/2015    Medications: Current Meds  Medication Sig   amLODipine (NORVASC) 5 MG tablet Take 1 tablet by  mouth once daily   FLUoxetine (PROZAC) 40 MG capsule Take 1 capsule by mouth once daily   Fluticasone-Salmeterol (ADVAIR) 250-50 MCG/DOSE AEPB Inhale 1 puff into the lungs 2 (two) times daily.   Garlic Oil 2 MG CAPS Take 1,000 mg by mouth 2 (two) times daily. Reported on 11/17/2015   insulin glargine (LANTUS) 100 UNIT/ML injection INJECT 70 UNITS SUBCUTANEOUSLY ONCE DAILY   losartan (COZAAR) 50 MG tablet Take 1 tablet (50 mg total) by mouth daily.   metFORMIN (GLUCOPHAGE) 1000 MG tablet TAKE 1 TABLET BY MOUTH TWICE DAILY WITH  MEALS   MOUNJARO 5 MG/0.5ML Pen INJECT 5 MG  SUBCUTANEOUSLY ONCE A WEEK   omeprazole (PRILOSEC) 20 MG capsule Take 20 mg by mouth daily.    Social History: Social History   Tobacco Use   Smoking status: Former    Years: 30.00    Types: Cigarettes    Quit date: 05/18/2004    Years since quitting: 18.0   Smokeless tobacco: Former  Scientific laboratory technician Use: Never used  Substance Use Topics   Alcohol use: No    Alcohol/week: 0.0 standard drinks of alcohol   Drug use: No    Family Medical History: Family History  Problem Relation Age of Onset   Diabetes Mother    Heart disease Father    Cancer Father    Hyperlipidemia Father  Hypertension Father    Diabetes Brother    COPD Brother    Autoimmune disease Brother    Heart disease Brother    Diabetes Brother     Physical Examination: Vitals:   06/13/22 2105 06/14/22 0414  BP: (!) 167/80 (!) 175/90  Pulse: 90 89  Resp: 17 18  Temp: 99 F (37.2 C) 97.8 F (36.6 C)  SpO2: 95% 94%    General: Patient is well developed, well nourished, calm, collected, and in no apparent distress. Attention to examination is appropriate.  Psychiatric: Patient is non-anxious.  Head:  Pupils equal, round, and reactive to light.  ENT:  Oral mucosa appears well hydrated.  Neck:   Supple.  Full range of motion.  Respiratory: Patient is breathing without any difficulty.  Extremities: No  edema.  Vascular: Palpable dorsal pedal pulses.  Skin:   On exposed skin, there are no abnormal skin lesions.  NEUROLOGICAL:     Awake, alert, oriented to person, place, and time.  Speech is clear and fluent. Fund of knowledge is appropriate.   Cranial Nerves: Pupils equal round and reactive to light.  Facial tone is symmetric.  Facial sensation is symmetric. Shoulder shrug is symmetric. Tongue protrusion is midline.  There is no pronator drift.   Strength: Side Biceps Triceps Deltoid Interossei Grip Wrist Ext. Wrist Flex.  R '5 5 5 5 5 5 5  '$ L '5 5 5 5 5 5 5   '$ Side Iliopsoas Quads Hamstring PF DF EHL  R 4+ 4+ '5 5 5 5  '$ L '5 5 5 5 5 5   '$ Reflexes are 3+ and symmetric at the biceps, triceps, brachioradialis, 2+ patella and achilles.   Hoffman's is present on the left.  Clonus is present (1-2 beats bilaterally).  Toes are down-going.  Bilateral upper and lower extremity sensation is intact to light touch.    No evidence of dysmetria noted.  Gait is untested - he cannot walk without assistance.    Medical Decision Making  Imaging: MRI L spine 06/13/2022 IMPRESSION: 1. No evidence of metastatic disease in the lumbar spine. 2. Multifactorial moderate spinal canal stenosis and moderate left neural foraminal stenosis at L3-L4 with potential irritation either traversing L4 nerve root. 3. 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 4. Small disc protrusion at L5-S1 which contacts but does not impinge the traversing left S1 nerve root.     Electronically Signed   By: Valetta Mole M.D.   On: 06/13/2022 13:06  I have personally reviewed the images and agree with the above interpretation.  Assessment and Plan: [name] is a  pleasant 70 y.o. male with imbalance, abnormal reflexes, and concern for progressive difficulty walking.  This course of symptoms would not normally occur from spondylotic changes in the spine leading to neurogenic claudication.  On his lumbar MRI scan, I am concerned about the appearance of his lower thoracic spinal cord, which is incompletely evaluated on the lumbar spine MRI.  He also has abnormal reflexes in the arms suggestive of possible cervical spine pathology.  I will recommend and order C and T spine MRI due to abnormal reflexes and concern for cervical and thoracic myelopathy.   I spent a total of 30 minutes in face-to-face and non-face-to-face activities related to this patient's  care today.  Thank you for involving me in the care of this patient.   This note was partially dictated using voice recognition software, so please excuse any errors that were not corrected.   Kortnie Stovall K. Izora Ribas MD, [facility] Neurosurgery

## 2022-06-14 NOTE — Progress Notes (Signed)
Physical Therapy Treatment Patient Details Name: Henry Horne MRN: [id number] DOB: [birth year] Today's Date: 06/14/2022   History of Present Illness Patient is a 71 year old male who presents from the ED  with a chief complaint of dizziness/lightheadedness, generalized weakness, unsteadiness on his feet and twinges of pain to his chest throughout the day. History of SDH. MRI of brain reports no acute intracranial infarct or other abnormality.   PT Comments    Patient with increased activity tolerance this session. He ambulated in hallway with Min guard initially progressing to supervision, 148f. Recommend to use rolling walker for safety with ambulation due to decreased step length bilaterally. No ataxia is noted with ambulation. Anticipate patient could return home with rolling walker and assistance from family. Spouse declined the need for home health PT at this time. PT will continue to follow to maximize independence and facilitate return to prior level of function.    Recommendations for follow up therapy are one component of a multi-disciplinary discharge planning process, led by the attending physician.  Recommendations may be updated based on patient status, additional functional criteria and insurance authorization.  Follow Up Recommendations   (HHPT recommended but family declined)     Assistance Recommended at Discharge Set up Supervision/Assistance  Patient can return home with the following A little help with walking and/or transfers;A little help with bathing/dressing/bathroom;Help with stairs or ramp for entrance;Assist for transportation   Equipment Recommendations  Rolling walker (2 wheels) (family reports they have a rolling walker at home)    Recommendations for Other Services       Precautions / Restrictions Precautions Precautions: Fall Restrictions Weight Bearing Restrictions: No     Mobility  Bed Mobility Overal bed mobility: Needs Assistance Bed  Mobility: Supine to Sit       Sit to supine: Supervision   General bed mobility comments: head of bed elevated and heavy use of bed rails. cues for technique    Transfers Overall transfer level: Needs assistance Equipment used: None Transfers: Sit to/from Stand Sit to Stand: Supervision           General transfer comment: supervision for safety. no physical assistance required for standing    Ambulation/Gait Ambulation/Gait assistance: Min guard, Supervision Gait Distance (Feet): 120 Feet Assistive device: Rolling walker (2 wheels) Gait Pattern/deviations: Step-through pattern, Decreased stride length Gait velocity: decreased     General Gait Details: patient ambulated in hallway with rolling walker with no loss of balance, occasional cues for safety. no ataxia noted with ambulation, however patient has decreased step length bilaterally. recommend to use rolling walker for safety   Stairs             Wheelchair Mobility    Modified Rankin (Stroke Patients Only)       Balance Overall balance assessment: Needs assistance Sitting-balance support: Feet supported Sitting balance-Leahy Scale: Good     Standing balance support: Bilateral upper extremity supported Standing balance-Leahy Scale: Fair Standing balance comment: using rolling walker for safety with standing                            Cognition Arousal/Alertness: Awake/alert Behavior During Therapy: WFL for tasks assessed/performed Overall Cognitive Status: Within Functional Limits for tasks assessed                                 General Comments: patient is following  all commands with increased time.        Exercises      General Comments General comments (skin integrity, edema, etc.): spouse requested to have privacy to wash the patient while seated on the toilet at end of session. neurosurgery cleared patient for mobilty via secure chat prior to mobilizing  today.      Pertinent Vitals/Pain Pain Assessment Pain Assessment: No/denies pain    Home Living                          Prior Function            PT Goals (current goals can now be found in the care plan section) Acute Rehab PT Goals Patient Stated Goal: to be able to walk PT Goal Formulation: With patient Time For Goal Achievement: 06/27/22 Potential to Achieve Goals: Fair Progress towards PT goals: Progressing toward goals    Frequency    Min 2X/week      PT Plan Current plan remains appropriate    Co-evaluation              AM-PAC PT "6 Clicks" Mobility   Outcome Measure  Help needed turning from your back to your side while in a flat bed without using bedrails?: A Little Help needed moving from lying on your back to sitting on the side of a flat bed without using bedrails?: A Lot Help needed moving to and from a bed to a chair (including a wheelchair)?: A Little Help needed standing up from a chair using your arms (e.g., wheelchair or bedside chair)?: A Little Help needed to walk in hospital room?: A Little Help needed climbing 3-5 steps with a railing? : A Little 6 Click Score: 17    End of Session Equipment Utilized During Treatment: Gait belt Activity Tolerance: Patient tolerated treatment well Patient left: Other (comment) (on the toilet with spouse present per spouse request) Nurse Communication: Mobility status PT Visit Diagnosis: Unsteadiness on feet (R26.81);History of falling (Z91.81)     Time: 3007-6226 PT Time Calculation (min) (ACUTE ONLY): 30 min  Charges:  $Gait Training: 8-22 mins $Therapeutic Activity: 8-22 mins                     Minna Merritts, PT, MPT    Henry Horne 06/14/2022, 12:29 PM

## 2022-06-14 NOTE — Discharge Summary (Addendum)
Physician Discharge Summary   Patient: Henry Horne MRN: [id number] DOB: 12-26-1950  Admit date:     06/13/2022  Discharge date: 06/14/22  Discharge Physician: Fritzi Mandes   PCP: Olin Hauser, DO   Recommendations at discharge:   follow-up neurosurgery Dr. Cari Caraway in 1-2 weeks  Discharge Diagnoses: Principal Problem:   Ataxia Active Problems:   Essential hypertension   Type 2 diabetes mellitus with peripheral neuropathy ([place])   Asthma   Depression with anxiety   Leukocytosis   Obesity with body mass index (BMI) of 30.0 to 39.9   HLD (hyperlipidemia)   Hospital Course: Henry Horne is a 71 y.o. male with medical history significant of hypertension, hyperlipidemia, diabetes mellitus, asthma, GERD, depression with anxiety, prostate cancer (s/p cryotherapy), bladder cancer, obesity with  BMI 34.72, subdural hematoma in March due to fall, who presents with unsteady gait and dizziness.  Ataxia Etiology is not clear.  CT of head and MRI of the brain negative for acute intracranial issues.   -Consulted Dr. Cari Caraway for neurosurgery-- recommended MRI cervical and thoracic spine. Reviewed results with Dr. Cari Caraway. No evidence of cord compression or myelopathy. Continue physical therapy. Patient ambulated using walker with therapy today. Home health PT is recommended however patient and wife would like outpatient therapy--referral sent to [facility] PT -- Patient denies any back pain.   Essential hypertension - IV hydralazine as needed -Amlodipine, Cozaar   Type 2 diabetes mellitus with peripheral neuropathy (HCC) Recent A1c 8.9, poorly controlled.  Patient is taking metformin and glargine insulin 70 units daily -resume home dose of insulin and PO meds  Asthma - Stable -As needed albuterol and Mucinex   Depression with anxiety - Continue home medications   Leukocytosis WBC 12.8, no signs of infection, likely reactive   Obesity with body mass index (BMI)  of 30.0 to 39.9  BMI= 34.72   and BW= 109.8 -Diet and exercise.   -Encouraged to lose weight. Discussed discharge plan with patient and wife in agreement      Consultants: neurosurgery Dr. Cari Caraway Procedures performed: none Disposition: Home Diet recommendation:  Discharge Diet Orders (From admission, onward)     Start     Ordered   06/14/22 0000  Diet - low sodium heart healthy        06/14/22 1548           Cardiac and Carb modified diet DISCHARGE MEDICATION: Allergies as of 06/14/2022       Reactions   Zetia [ezetimibe] Other (See Comments)   Joint pain, congestion, muscle ache Joint pain, congestion, muscle ache   Ace Inhibitors Cough   Pneumococcal Vaccine    Pneumococcal Vaccines         Medication List     TAKE these medications    albuterol 108 (90 Base) MCG/ACT inhaler Commonly known as: VENTOLIN HFA INHALE 1 TO 2 PUFFS BY MOUTH EVERY 4 HOURS AS NEEDED FOR WHEEZING OR SHORTNESS OF BREATH   amLODipine 5 MG tablet Commonly known as: NORVASC Take 1 tablet by mouth once daily   diclofenac sodium 1 % Gel Commonly known as: VOLTAREN Apply 2 g topically 3 (three) times daily as needed. For knees   FLUoxetine 40 MG capsule Commonly known as: PROZAC Take 1 capsule by mouth once daily   fluticasone 50 MCG/ACT nasal spray Commonly known as: FLONASE INSTILL 2 SPRAYS INTO BOTH NOSTRIL DAILY. USE FOR 4 TO 6 WEEKS THEN STOP AND USE SEASONALLY OR AS NEEDED.   Fluticasone-Salmeterol 250-50  MCG/DOSE Aepb Commonly known as: ADVAIR Inhale 1 puff into the lungs 2 (two) times daily.   Garlic Oil 2 MG Caps Take 1,000 mg by mouth 2 (two) times daily. Reported on 11/17/2015   insulin glargine 100 UNIT/ML injection Commonly known as: Lantus INJECT 70 UNITS SUBCUTANEOUSLY ONCE DAILY   losartan 50 MG tablet Commonly known as: COZAAR Take 1 tablet (50 mg total) by mouth daily.   metFORMIN 1000 MG tablet Commonly known as: GLUCOPHAGE TAKE 1 TABLET BY  MOUTH TWICE DAILY WITH  MEALS   Mounjaro 5 MG/0.5ML Pen Generic drug: tirzepatide INJECT 5 MG  SUBCUTANEOUSLY ONCE A WEEK   omeprazole 20 MG capsule Commonly known as: PRILOSEC Take 20 mg by mouth daily.        Follow-up Information     Olin Hauser, DO. Go to.   Specialty: Family Medicine Why: On your upcoming appointment Contact information: [place] [address] [phone]         Meade Maw, MD. Go to.   Specialty: Neurosurgery Why: office will call pt. Contact information: [address] [place] [address] [phone]                Discharge Exam: Danley Danker Weights   06/12/22 2135  Weight: 109.8 kg     Condition at discharge: fair  The results of significant diagnostics from this hospitalization (including imaging, microbiology, ancillary and laboratory) are listed below for reference.   Imaging Studies: MR CERVICAL SPINE WO CONTRAST  Result Date: 06/14/2022 CLINICAL DATA:  Imbalance, abnormal reflexes and progressive difficulty walking. Concern for acute myelopathy. EXAM: MRI CERVICAL AND THORACIC SPINE WITHOUT CONTRAST TECHNIQUE: Multiplanar and multiecho pulse sequences of the cervical spine, to include the craniocervical junction and cervicothoracic junction, and the thoracic spine, were obtained without intravenous contrast. COMPARISON:  MRI lumbar spine 06/13/2022. CT cervical spine 01/30/2022. FINDINGS: Despite efforts by the technologist and patient, mild motion artifact is present on today's exam and could not be eliminated. This reduces exam sensitivity and specificity. MRI CERVICAL SPINE FINDINGS Alignment: Physiologic. Vertebrae: No acute or suspicious osseous findings. Cord: Normal in signal and caliber. Posterior Fossa, vertebral arteries, paraspinal tissues: Visualized portions of the posterior fossa appear unremarkable.Bilateral vertebral artery flow voids. No significant paraspinal findings.  Disc levels: Minimal spondylosis for age. There are small central disc protrusions, largest at C5-6. No resulting cord deformity. Scattered mild facet hypertrophy without significant resulting foraminal narrowing. MRI THORACIC SPINE FINDINGS Alignment:  Normal. Vertebrae: No evidence of acute fracture or suspicious focal lesion. There are scattered endplate degenerative changes and paraspinal osteophytes, most prominent at T9-10. Cord: The thoracic cord is normal in signal and caliber. The conus medullaris extends to the L1 level and appears normal. Paraspinal and other soft tissues: The paraspinal soft tissues appear normal. Disc levels: Mild disc bulging and endplate osteophyte formation in the upper thoracic spine. No significant disc space findings at or above T6-7. T7-8: Small right paracentral disc protrusion without cord deformity or significant foraminal narrowing. T8-9: Small central disc protrusion without cord deformity or foraminal compromise. T9-10: Mild disc bulging with prominent anterior osteophytes. No spinal stenosis or nerve root encroachment. T10-11: Mild disc bulging with paraspinal osteophyte formation. No spinal stenosis or nerve root encroachment. T11-12: Mild disc bulging and endplate osteophyte formation. No spinal stenosis or nerve root encroachment. IMPRESSION: 1. No evidence of cord compression or myelopathy in the cervical or thoracic spine. 2. Minimal cervical spondylosis without resulting spinal stenosis or nerve  root encroachment. 3. Scattered small thoracic disc protrusions with prominent endplate osteophytes, but no resulting spinal stenosis or nerve root encroachment. Electronically Signed   By: Richardean Sale M.D.   On: 06/14/2022 15:24   MR THORACIC SPINE WO CONTRAST  Result Date: 06/14/2022 CLINICAL DATA:  Imbalance, abnormal reflexes and progressive difficulty walking. Concern for acute myelopathy. EXAM: MRI CERVICAL AND THORACIC SPINE WITHOUT CONTRAST TECHNIQUE:  Multiplanar and multiecho pulse sequences of the cervical spine, to include the craniocervical junction and cervicothoracic junction, and the thoracic spine, were obtained without intravenous contrast. COMPARISON:  MRI lumbar spine 06/13/2022. CT cervical spine 01/30/2022. FINDINGS: Despite efforts by the technologist and patient, mild motion artifact is present on today's exam and could not be eliminated. This reduces exam sensitivity and specificity. MRI CERVICAL SPINE FINDINGS Alignment: Physiologic. Vertebrae: No acute or suspicious osseous findings. Cord: Normal in signal and caliber. Posterior Fossa, vertebral arteries, paraspinal tissues: Visualized portions of the posterior fossa appear unremarkable.Bilateral vertebral artery flow voids. No significant paraspinal findings. Disc levels: Minimal spondylosis for age. There are small central disc protrusions, largest at C5-6. No resulting cord deformity. Scattered mild facet hypertrophy without significant resulting foraminal narrowing. MRI THORACIC SPINE FINDINGS Alignment:  Normal. Vertebrae: No evidence of acute fracture or suspicious focal lesion. There are scattered endplate degenerative changes and paraspinal osteophytes, most prominent at T9-10. Cord: The thoracic cord is normal in signal and caliber. The conus medullaris extends to the L1 level and appears normal. Paraspinal and other soft tissues: The paraspinal soft tissues appear normal. Disc levels: Mild disc bulging and endplate osteophyte formation in the upper thoracic spine. No significant disc space findings at or above T6-7. T7-8: Small right paracentral disc protrusion without cord deformity or significant foraminal narrowing. T8-9: Small central disc protrusion without cord deformity or foraminal compromise. T9-10: Mild disc bulging with prominent anterior osteophytes. No spinal stenosis or nerve root encroachment. T10-11: Mild disc bulging with paraspinal osteophyte formation. No spinal  stenosis or nerve root encroachment. T11-12: Mild disc bulging and endplate osteophyte formation. No spinal stenosis or nerve root encroachment. IMPRESSION: 1. No evidence of cord compression or myelopathy in the cervical or thoracic spine. 2. Minimal cervical spondylosis without resulting spinal stenosis or nerve root encroachment. 3. Scattered small thoracic disc protrusions with prominent endplate osteophytes, but no resulting spinal stenosis or nerve root encroachment. Electronically Signed   By: Richardean Sale M.D.   On: 06/14/2022 15:24   MR Lumbar Spine W Wo Contrast  Result Date: 06/13/2022 CLINICAL DATA:  History of prostate and bladder cancer presenting with ataxia and unsteady gait. EXAM: MRI LUMBAR SPINE WITHOUT AND WITH CONTRAST TECHNIQUE: Multiplanar and multiecho pulse sequences of the lumbar spine were obtained without and with intravenous contrast. CONTRAST:  71m GADAVIST GADOBUTROL 1 MMOL/ML IV SOLN COMPARISON:  CT abdomen/pelvis 09/16/2010 FINDINGS: Segmentation: Standard; the lowest formed disc space is designated L5-S1. Alignment:  There is trace retrolisthesis of L2 on L3 and L3 on L4. Vertebrae: Vertebral body heights are preserved. Background marrow signal is normal. There is no suspicious marrow signal abnormality. There is degenerative endplate marrow signal abnormality at L4-L5 with minimal enhancement. There is no other marrow enhancement. Conus medullaris and cauda equina: Conus extends to the L1 level. Conus and cauda equina appear normal. There is no abnormal enhancement of the cauda equina nerve roots. Paraspinal and other soft tissues: Unremarkable. Disc levels: Disc desiccation and narrowing is most advanced at L3-L4 through L5-S1. T12-L1: No significant spinal canal or neural foraminal stenosis  L1-L2: There is a small right paracentral broad-based protrusion without significant spinal canal or neural foraminal stenosis L2-L3: There is a mild diffuse disc bulge eccentric to  the right and bilateral facet arthropathy resulting in mild spinal canal stenosis and mild right worse than left neural foraminal stenosis. L3-L4: There is a diffuse disc bulge and bilateral facet arthropathy with ligamentum flavum thickening resulting in moderate spinal canal stenosis with effacement of the bilateral subarticular zones and potential irritation of either traversing L4 nerve root and moderate left and mild right neural foraminal stenosis. L4-L5: There is a diffuse disc bulge with a superimposed inferiorly migrated right subarticular zone extrusion (5-7), and moderate bilateral facet arthropathy with ligamentum flavum thickening resulting in moderate to severe spinal canal stenosis with effacement of the subarticular zones and potential irritation of either traversing L5 nerve root (particularly on the right due to the disc extrusion, 5-7), and moderate to severe bilateral neural foraminal stenosis. L5-S1: There is a mild broad-based central protrusion and mild bilateral facet arthropathy. The disc material contacts but does not impinge the traversing left S1 nerve root. There is no significant spinal canal or neural foraminal stenosis. IMPRESSION: 1. No evidence of metastatic disease in the lumbar spine. 2. Multifactorial moderate spinal canal stenosis and moderate left neural foraminal stenosis at L3-L4 with potential irritation either traversing L4 nerve root. 3. 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 4. Small disc protrusion at L5-S1 which contacts but does not impinge the traversing left S1 nerve root. Electronically Signed   By: Valetta Mole M.D.   On: 06/13/2022 13:06   MR BRAIN WO CONTRAST  Result Date: 06/13/2022 CLINICAL DATA:   Initial evaluation for neuro deficit, stroke suspected. EXAM: MRI HEAD WITHOUT CONTRAST TECHNIQUE: Multiplanar, multiecho pulse sequences of the brain and surrounding structures were obtained without intravenous contrast. COMPARISON:  Prior CT from 06/12/2022. FINDINGS: Brain: Cerebral volume within normal limits for age. Patchy T2/FLAIR hyperintensity involving the periventricular deep white matter both cerebral hemispheres as well as the pons, most consistent with chronic small vessel ischemic disease, mild-to-moderate in nature. No evidence for acute or subacute ischemia. Gray-white matter differentiation maintained. No encephalomalacia to suggest chronic cortical infarction. No acute or chronic intracranial blood products. No mass lesion, mass effect, or midline shift. No hydrocephalus or extra-axial fluid collection. Pituitary gland and suprasellar region within normal limits. Vascular: Major intracranial vascular flow voids are maintained. Skull and upper cervical spine: Craniocervical junction within normal limits. Bone marrow signal intensity normal. No scalp soft tissue abnormality. Sinuses/Orbits: Globes orbital soft tissues within normal limits. Retention cyst present at the right frontal and maxillary sinuses. No significant mastoid effusion. Other: None. IMPRESSION: 1. No acute intracranial infarct or other abnormality. 2. Mild to moderate chronic microvascular ischemic disease. Electronically Signed   By: Jeannine Boga M.D.   On: 06/13/2022 04:48   CT Head Wo Contrast  Result Date: 06/13/2022 CLINICAL DATA:  Mental status change, unknown cause Dizziness and weakness. EXAM: CT HEAD WITHOUT CONTRAST TECHNIQUE: Contiguous axial images were obtained from the base of the skull through the vertex without intravenous contrast. RADIATION DOSE REDUCTION: This exam was performed according to the departmental dose-optimization program which includes automated exposure control, adjustment of the mA  and/or kV according to patient size and/or use of iterative  reconstruction technique. COMPARISON:  Head CT 03/01/2022 FINDINGS: Brain: No acute intracranial hemorrhage or subdural collection. Stable atrophy and ventriculomegaly. Stable periventricular chronic small vessel ischemia. No evidence of acute infarct. No midline shift or mass effect. Vascular: Atherosclerosis of skullbase vasculature without hyperdense vessel or abnormal calcification. Skull: No fracture or focal lesion. Sinuses/Orbits: Stable mucous retention cyst in the right frontal sinus. Trace mucosal thickening of the right maxillary sinus, improved from prior exam. Improved ethmoid air cell mucosal thickening. No mastoid effusion. Other: None. IMPRESSION: 1. No acute intracranial abnormality. 2. Stable atrophy and chronic small vessel ischemia. Electronically Signed   By: Keith Rake M.D.   On: 06/13/2022 00:00   DG Chest 2 View  Result Date: 06/12/2022 CLINICAL DATA:  Chest pain, generalized weakness EXAM: CHEST - 2 VIEW COMPARISON:  01/30/2022 FINDINGS: Frontal and lateral views of the chest demonstrate an unremarkable cardiac silhouette. No acute airspace disease, effusion, or pneumothorax. No acute bony abnormalities. IMPRESSION: 1. No acute intrathoracic process. Electronically Signed   By: Randa Ngo M.D.   On: 06/12/2022 21:54    Microbiology: Results for orders placed or performed during the hospital encounter of 01/30/22  Resp Panel by RT-PCR (Flu A&B, Covid)     Status: None   Collection Time: 01/30/22 11:39 PM   Specimen: Nasopharyngeal(NP) swabs in vial transport medium  Result Value Ref Range Status   SARS Coronavirus 2 by RT PCR NEGATIVE NEGATIVE Final    Comment: (NOTE) SARS-CoV-2 target nucleic acids are NOT DETECTED.  The SARS-CoV-2 RNA is generally detectable in upper respiratory specimens during the acute phase of infection. The lowest concentration of SARS-CoV-2 viral copies this assay can detect  is 138 copies/mL. A negative result does not preclude SARS-Cov-2 infection and should not be used as the sole basis for treatment or other patient management decisions. A negative result may occur with  improper specimen collection/handling, submission of specimen other than nasopharyngeal swab, presence of viral mutation(s) within the areas targeted by this assay, and inadequate number of viral copies(<138 copies/mL). A negative result must be combined with clinical observations, patient history, and epidemiological information. The expected result is Negative.  Fact Sheet for Patients:  EntrepreneurPulse.com.au  Fact Sheet for Healthcare Providers:  IncredibleEmployment.be  This test is no t yet approved or cleared by the [place] FDA and  has been authorized for detection and/or diagnosis of SARS-CoV-2 by FDA under an Emergency Use Authorization (EUA). This EUA will remain  in effect (meaning this test can be used) for the duration of the COVID-19 declaration under Section 564(b)(1) of the Act, 21 U.S.C.section 360bbb-3(b)(1), unless the authorization is terminated  or revoked sooner.       Influenza A by PCR NEGATIVE NEGATIVE Final   Influenza B by PCR NEGATIVE NEGATIVE Final    Comment: (NOTE) The Xpert Xpress SARS-CoV-2/FLU/RSV plus assay is intended as an aid in the diagnosis of influenza from Nasopharyngeal swab specimens and should not be used as a sole basis for treatment. Nasal washings and aspirates are unacceptable for Xpert Xpress SARS-CoV-2/FLU/RSV testing.  Fact Sheet for Patients: EntrepreneurPulse.com.au  Fact Sheet for Healthcare Providers: IncredibleEmployment.be  This test is not yet approved or cleared by the [place] FDA and has been authorized for detection and/or diagnosis of SARS-CoV-2 by FDA under an Emergency Use Authorization (EUA). This EUA will remain in effect  (meaning this test can be used) for the duration of the COVID-19 declaration under Section 564(b)(1) of the Act, 21 U.S.C. section 360bbb-3(b)(1),  unless the authorization is terminated or revoked.  Performed at [facility], [address] [place], [place] [address]     Labs: CBC: Recent Labs  Lab 06/12/22 2139  WBC 12.8*  HGB 13.2  HCT 41.6  MCV 80.9  PLT 677   Basic Metabolic Panel: Recent Labs  Lab 06/12/22 2139  NA 134*  K 3.7  CL 103  CO2 23  GLUCOSE 198*  BUN 19  CREATININE 1.07  CALCIUM 8.7*   Liver Function Tests: No results for input(s): "AST", "ALT", "ALKPHOS", "BILITOT", "PROT", "ALBUMIN" in the last 168 hours. CBG: Recent Labs  Lab 06/13/22 1136 06/13/22 1633 06/13/22 2022 06/14/22 0850 06/14/22 1214  GLUCAP 176* 186* 261* 212* 293*    Discharge time spent: greater than 30 minutes.  Signed: Fritzi Mandes, MD [facility] 06/14/2022

## 2022-06-14 NOTE — Progress Notes (Signed)
Pt discharging home with family. PIV removed. Belongings packed up. Discharge packet reviewed with patient and family, verbalize understanding

## 2022-06-14 NOTE — TOC Progression Note (Signed)
Transition of Care [facility]) - Progression Note    Patient Details  Name: Henry Horne MRN: [id number] Date of Birth: May 25, 1951  Transition of Care [facility]) CM/SW [place], LCSW Phone Number: 06/14/2022, 2:22 PM  Clinical Narrative: Family refusing HH.           Expected Discharge Plan and Services                                                 Social Determinants of Health (SDOH) Interventions    Readmission Risk Interventions     No data to display

## 2022-06-16 ENCOUNTER — Ambulatory Visit: Payer: BC Managed Care – PPO | Admitting: Family Medicine

## 2022-06-16 ENCOUNTER — Encounter: Payer: Self-pay | Admitting: Family Medicine

## 2022-06-16 VITALS — BP 139/73 | HR 95 | Ht 70.0 in | Wt 235.8 lb

## 2022-06-16 DIAGNOSIS — J432 Centrilobular emphysema: Secondary | ICD-10-CM

## 2022-06-16 DIAGNOSIS — R42 Dizziness and giddiness: Secondary | ICD-10-CM

## 2022-06-16 DIAGNOSIS — J9801 Acute bronchospasm: Secondary | ICD-10-CM

## 2022-06-16 DIAGNOSIS — J209 Acute bronchitis, unspecified: Secondary | ICD-10-CM

## 2022-06-16 DIAGNOSIS — J440 Chronic obstructive pulmonary disease with acute lower respiratory infection: Secondary | ICD-10-CM | POA: Diagnosis not present

## 2022-06-16 DIAGNOSIS — R531 Weakness: Secondary | ICD-10-CM

## 2022-06-16 DIAGNOSIS — R2689 Other abnormalities of gait and mobility: Secondary | ICD-10-CM

## 2022-06-16 MED ORDER — PREDNISONE 10 MG PO TABS: ORAL_TABLET | ORAL | 0 refills | Status: DC

## 2022-06-16 MED ORDER — AMOXICILLIN-POT CLAVULANATE 875-125 MG PO TABS: 1.0000 | ORAL_TABLET | Freq: Two times a day (BID) | ORAL | 0 refills | Status: DC

## 2022-06-16 MED ORDER — BREZTRI AEROSPHERE 160-9-4.8 MCG/ACT IN AERO: 2.0000 | INHALATION_SPRAY | Freq: Two times a day (BID) | RESPIRATORY_TRACT | 11 refills | Status: AC

## 2022-06-16 NOTE — Progress Notes (Addendum)
Subjective:    Patient ID: Henry Horne, male    DOB: Mar 07, 1951, 71 y.o.   MRN: [id number]  Henry Horne is a 71 y.o. male presenting on 06/16/2022 for Hospitalization Follow-up   HPI  HOSPITAL FOLLOW-UP VISIT  Hospital/Location: [facility] Date of Admission: 06/13/22 Date of Discharge: 06/14/22 Transitions of care telephone call: Not completed   Reason for Admission: DIzziness, Ataxia unstable gait  - Hospital H&P and Discharge Summary have been reviewed - Patient presents today 2 days after recent hospitalization. Brief summary of recent course, patient had symptoms of inability to walk ataxia weakness, evaluation and neuro eval head imaging and neurosurg consult, PT eval. Overall he improved. No clear etiology to explain.  Shuffling walking [place] last week / weekend. They stopped at rest area, took a while to get back into truck.  Ataxia [facility] ED 06/13/22, has had CT Head / MRI Brain  Neurosurgery consult with MRI cervical  thoracic spine, consulted with Dr Grover Canavan. He did not have back pain. They referred for "back pain" but he did not have back pain.  Cough 1.5 week with copd flare with increased sputum. Has advair but not helping  Already waiting on Referral to [facility] PT to process, they will arrange / schedule  - Today reports overall has done well after discharge. Symptoms of ataxia difficulty walking has improved, now he can ambulate without assistance, improved w/ walker, still has upcoming PT  He has been out of work since 06/13/22, and he is unable to return at this time due to weakness, ataxia, and dyspnea with COPD flare   I have reviewed the discharge medication list, and have reconciled the current and discharge medications today.   Current Outpatient Medications:    albuterol (VENTOLIN HFA) 108 (90 Base) MCG/ACT inhaler, INHALE 1 TO 2 PUFFS BY MOUTH EVERY 4 HOURS AS NEEDED FOR WHEEZING OR SHORTNESS OF BREATH, Disp: 9 g, Rfl: 0   amLODipine (NORVASC) 5  MG tablet, Take 1 tablet by mouth once daily, Disp: 90 tablet, Rfl: 0   amoxicillin-clavulanate (AUGMENTIN) 875-125 MG tablet, Take 1 tablet by mouth 2 (two) times daily., Disp: 20 tablet, Rfl: 0   BREZTRI AEROSPHERE 160-9-4.8 MCG/ACT AERO, Inhale 2 puffs into the lungs 2 (two) times daily., Disp: 10.7 g, Rfl: 11   diclofenac sodium (VOLTAREN) 1 % GEL, Apply 2 g topically 3 (three) times daily as needed. For knees, Disp: 100 g, Rfl: 2   FLUoxetine (PROZAC) 40 MG capsule, Take 1 capsule by mouth once daily, Disp: 90 capsule, Rfl: 1   fluticasone (FLONASE) 50 MCG/ACT nasal spray, INSTILL 2 SPRAYS INTO BOTH NOSTRIL DAILY. USE FOR 4 TO 6 WEEKS THEN STOP AND USE SEASONALLY OR AS NEEDED., Disp: 16 g, Rfl: 3   Garlic Oil 2 MG CAPS, Take 1,000 mg by mouth 2 (two) times daily. Reported on 11/17/2015, Disp: , Rfl:    insulin glargine (LANTUS) 100 UNIT/ML injection, INJECT 70 UNITS SUBCUTANEOUSLY ONCE DAILY, Disp: 60 mL, Rfl: 0   losartan (COZAAR) 50 MG tablet, Take 1 tablet (50 mg total) by mouth daily., Disp: 90 tablet, Rfl: 3   metFORMIN (GLUCOPHAGE) 1000 MG tablet, TAKE 1 TABLET BY MOUTH TWICE DAILY WITH  MEALS, Disp: 180 tablet, Rfl: 0   MOUNJARO 5 MG/0.5ML Pen, INJECT 5 MG  SUBCUTANEOUSLY ONCE A WEEK, Disp: 6 mL, Rfl: 0   omeprazole (PRILOSEC) 20 MG capsule, Take 20 mg by mouth daily., Disp: , Rfl:    predniSONE (DELTASONE) 10  MG tablet, Take 6 tabs with breakfast Day 1, 5 tabs Day 2, 4 tabs Day 3, 3 tabs Day 4, 2 tabs Day 5, 1 tab Day 6., Disp: 21 tablet, Rfl: 0  ------------------------------------------------------------------------- Social History   Tobacco Use   Smoking status: Former    Years: 30.00    Types: Cigarettes    Quit date: 05/18/2004    Years since quitting: 18.0   Smokeless tobacco: Former  Scientific laboratory technician Use: Never used  Substance Use Topics   Alcohol use: No    Alcohol/week: 0.0 standard drinks of alcohol   Drug use: No    Review of Systems Per HPI unless  specifically indicated above     Objective:    BP 139/73   Pulse 95   Ht '5\' 10"'$  (1.778 m)   Wt 235 lb 12.8 oz (107 kg)   SpO2 98%   BMI 33.83 kg/m   Wt Readings from Last 3 Encounters:  06/16/22 235 lb 12.8 oz (107 kg)  06/12/22 242 lb (109.8 kg)  05/20/22 239 lb (108.4 kg)    Physical Exam Vitals and nursing note reviewed.  Constitutional:      General: He is not in acute distress.    Appearance: He is well-developed. He is obese. He is not diaphoretic.     Comments: Well-appearing, comfortable, cooperative  HENT:     Head: Normocephalic and atraumatic.  Eyes:     General:        Right eye: No discharge.        Left eye: No discharge.     Conjunctiva/sclera: Conjunctivae normal.  Neck:     Thyroid: No thyromegaly.  Cardiovascular:     Rate and Rhythm: Normal rate and regular rhythm.     Pulses: Normal pulses.     Heart sounds: Normal heart sounds. No murmur heard. Pulmonary:     Effort: Pulmonary effort is normal. No respiratory distress.     Breath sounds: Wheezing present. No rales.     Comments: coughing Musculoskeletal:        General: Normal range of motion.     Cervical back: Normal range of motion and neck supple.     Comments: Gait is slow and cautious but can stand and walk unassisted. Shuffling gait at times.  Extremity muscle weakness 4/5 upper and lower ext  Lymphadenopathy:     Cervical: No cervical adenopathy.  Skin:    General: Skin is warm and dry.     Findings: No erythema or rash.  Neurological:     Mental Status: He is alert and oriented to person, place, and time. Mental status is at baseline.  Psychiatric:        Behavior: Behavior normal.     Comments: Well groomed, good eye contact, normal speech and thoughts       Results for orders placed or performed during the hospital encounter of 54/65/03  Basic metabolic panel  Result Value Ref Range   Sodium 134 (L) 135 - 145 mmol/L   Potassium 3.7 3.5 - 5.1 mmol/L   Chloride 103 98 -  111 mmol/L   CO2 23 22 - 32 mmol/L   Glucose, Bld 198 (H) 70 - 99 mg/dL   BUN 19 8 - 23 mg/dL   Creatinine, Ser 1.07 0.61 - 1.24 mg/dL   Calcium 8.7 (L) 8.9 - 10.3 mg/dL   GFR, Estimated >60 >60 mL/min   Anion gap 8 5 - 15  CBC  Result  Value Ref Range   WBC 12.8 (H) 4.0 - 10.5 K/uL   RBC 5.14 4.22 - 5.81 MIL/uL   Hemoglobin 13.2 13.0 - 17.0 g/dL   HCT 41.6 39.0 - 52.0 %   MCV 80.9 80.0 - 100.0 fL   MCH 25.7 (L) 26.0 - 34.0 pg   MCHC 31.7 30.0 - 36.0 g/dL   RDW 13.3 11.5 - 15.5 %   Platelets 241 150 - 400 K/uL   nRBC 0.0 0.0 - 0.2 %  Urine Drug Screen, Qualitative  Result Value Ref Range   Tricyclic, Ur Screen POSITIVE (A) NONE DETECTED   Amphetamines, Ur Screen NONE DETECTED NONE DETECTED   MDMA (Ecstasy)Ur Screen NONE DETECTED NONE DETECTED   Cocaine Metabolite,Ur [place] NONE DETECTED NONE DETECTED   Opiate, Ur Screen NONE DETECTED NONE DETECTED   Phencyclidine (PCP) Ur S NONE DETECTED NONE DETECTED   Cannabinoid 50 Ng, Ur [place] NONE DETECTED NONE DETECTED   Barbiturates, Ur Screen NONE DETECTED NONE DETECTED   Benzodiazepine, Ur Scrn NONE DETECTED NONE DETECTED   Methadone Scn, Ur NONE DETECTED NONE DETECTED  Urinalysis, Routine w reflex microscopic Urine, Clean Catch  Result Value Ref Range   Color, Urine YELLOW YELLOW   APPearance CLEAR CLEAR   Specific Gravity, Urine 1.010 1.005 - 1.030   pH 5.5 5.0 - 8.0   Glucose, UA 100 (A) NEGATIVE mg/dL   Hgb urine dipstick TRACE (A) NEGATIVE   Bilirubin Urine NEGATIVE NEGATIVE   Ketones, ur TRACE (A) NEGATIVE mg/dL   Protein, ur NEGATIVE NEGATIVE mg/dL   Nitrite NEGATIVE NEGATIVE   Leukocytes,Ua NEGATIVE NEGATIVE  Lipid panel  Result Value Ref Range   Cholesterol 174 0 - 200 mg/dL   Triglycerides 83 <150 mg/dL   HDL 37 (L) >40 mg/dL   Total CHOL/HDL Ratio 4.7 RATIO   VLDL 17 0 - 40 mg/dL   LDL Cholesterol 120 (H) 0 - 99 mg/dL  Urinalysis, Microscopic (reflex)  Result Value Ref Range   RBC / HPF NONE SEEN 0 - 5 RBC/hpf    WBC, UA NONE SEEN 0 - 5 WBC/hpf   Bacteria, UA NONE SEEN NONE SEEN   Squamous Epithelial / LPF NONE SEEN 0 - 5  Vitamin B12  Result Value Ref Range   Vitamin B-12 175 (L) 180 - 914 pg/mL  Glucose, capillary  Result Value Ref Range   Glucose-Capillary 261 (H) 70 - 99 mg/dL  Glucose, capillary  Result Value Ref Range   Glucose-Capillary 186 (H) 70 - 99 mg/dL  Glucose, capillary  Result Value Ref Range   Glucose-Capillary 212 (H) 70 - 99 mg/dL  Glucose, capillary  Result Value Ref Range   Glucose-Capillary 293 (H) 70 - 99 mg/dL  Glucose, capillary  Result Value Ref Range   Glucose-Capillary 161 (H) 70 - 99 mg/dL  CBG monitoring, ED  Result Value Ref Range   Glucose-Capillary 176 (H) 70 - 99 mg/dL  CBG monitoring, ED  Result Value Ref Range   Glucose-Capillary 176 (H) 70 - 99 mg/dL  Troponin I (High Sensitivity)  Result Value Ref Range   Troponin I (High Sensitivity) 7 <18 ng/L  Troponin I (High Sensitivity)  Result Value Ref Range   Troponin I (High Sensitivity) 7 <18 ng/L   I have personally reviewed the radiology report from 06/13/22 on MRI Brain.  CLINICAL DATA:  Initial evaluation for neuro deficit, stroke suspected.   EXAM: MRI HEAD WITHOUT CONTRAST   TECHNIQUE: Multiplanar, multiecho pulse sequences of the brain and  surrounding structures were obtained without intravenous contrast.   COMPARISON:  Prior CT from 06/12/2022.   FINDINGS: Brain: Cerebral volume within normal limits for age. Patchy T2/FLAIR hyperintensity involving the periventricular deep white matter both cerebral hemispheres as well as the pons, most consistent with chronic small vessel ischemic disease, mild-to-moderate in nature.   No evidence for acute or subacute ischemia. Gray-white matter differentiation maintained. No encephalomalacia to suggest chronic cortical infarction. No acute or chronic intracranial blood products.   No mass lesion, mass effect, or midline shift. No  hydrocephalus or extra-axial fluid collection. Pituitary gland and suprasellar region within normal limits.   Vascular: Major intracranial vascular flow voids are maintained.   Skull and upper cervical spine: Craniocervical junction within normal limits. Bone marrow signal intensity normal. No scalp soft tissue abnormality.   Sinuses/Orbits: Globes orbital soft tissues within normal limits. Retention cyst present at the right frontal and maxillary sinuses. No significant mastoid effusion.   Other: None.   IMPRESSION: 1. No acute intracranial infarct or other abnormality. 2. Mild to moderate chronic microvascular ischemic disease.     Electronically Signed   By: Jeannine Boga M.D.   On: 06/13/2022 04:48  CLINICAL DATA:  History of prostate and bladder cancer presenting with ataxia and unsteady gait.   EXAM: MRI LUMBAR SPINE WITHOUT AND WITH CONTRAST   TECHNIQUE: Multiplanar and multiecho pulse sequences of the lumbar spine were obtained without and with intravenous contrast.   CONTRAST:  29m GADAVIST GADOBUTROL 1 MMOL/ML IV SOLN   COMPARISON:  CT abdomen/pelvis 09/16/2010   FINDINGS: Segmentation: Standard; the lowest formed disc space is designated L5-S1.   Alignment:  There is trace retrolisthesis of L2 on L3 and L3 on L4.   Vertebrae: Vertebral body heights are preserved. Background marrow signal is normal. There is no suspicious marrow signal abnormality. There is degenerative endplate marrow signal abnormality at L4-L5 with minimal enhancement. There is no other marrow enhancement.   Conus medullaris and cauda equina: Conus extends to the L1 level. Conus and cauda equina appear normal. There is no abnormal enhancement of the cauda equina nerve roots.   Paraspinal and other soft tissues: Unremarkable.   Disc levels:   Disc desiccation and narrowing is most advanced at L3-L4 through L5-S1.   T12-L1: No significant spinal canal or neural foraminal  stenosis   L1-L2: There is a small right paracentral broad-based protrusion without significant spinal canal or neural foraminal stenosis   L2-L3: There is a mild diffuse disc bulge eccentric to the right and bilateral facet arthropathy resulting in mild spinal canal stenosis and mild right worse than left neural foraminal stenosis.   L3-L4: There is a diffuse disc bulge and bilateral facet arthropathy with ligamentum flavum thickening resulting in moderate spinal canal stenosis with effacement of the bilateral subarticular zones and potential irritation of either traversing L4 nerve root and moderate left and mild right neural foraminal stenosis.   L4-L5: There is a diffuse disc bulge with a superimposed inferiorly migrated right subarticular zone extrusion (5-7), and moderate bilateral facet arthropathy with ligamentum flavum thickening resulting in moderate to severe spinal canal stenosis with effacement of the subarticular zones and potential irritation of either traversing L5 nerve root (particularly on the right due to the disc extrusion, 5-7), and moderate to severe bilateral neural foraminal stenosis.   L5-S1: There is a mild broad-based central protrusion and mild bilateral facet arthropathy. The disc material contacts but does not impinge the traversing left S1 nerve root. There is  no significant spinal canal or neural foraminal stenosis.   IMPRESSION: 1. No evidence of metastatic disease in the lumbar spine. 2. Multifactorial moderate spinal canal stenosis and moderate left neural foraminal stenosis at L3-L4 with potential irritation either traversing L4 nerve root. 3. 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 4.  Small disc protrusion at L5-S1 which contacts but does not impinge the traversing left S1 nerve root.     Electronically Signed   By: Valetta Mole M.D.   On: 06/13/2022 13:06  CLINICAL DATA:  Chest pain, generalized weakness   EXAM: CHEST - 2 VIEW   COMPARISON:  01/30/2022   FINDINGS: Frontal and lateral views of the chest demonstrate an unremarkable cardiac silhouette. No acute airspace disease, effusion, or pneumothorax. No acute bony abnormalities.   IMPRESSION: 1. No acute intrathoracic process.     Electronically Signed   By: Randa Ngo M.D.   On: 06/12/2022 21:54     Assessment & Plan:   Problem List Items Addressed This Visit     Centrilobular emphysema ([place])   Relevant Medications   BREZTRI AEROSPHERE 160-9-4.8 MCG/ACT AERO   predniSONE (DELTASONE) 10 MG tablet   Other Visit Diagnoses     Acute bronchitis with COPD ([place])    -  Primary   Relevant Medications   BREZTRI AEROSPHERE 160-9-4.8 MCG/ACT AERO   amoxicillin-clavulanate (AUGMENTIN) 875-125 MG tablet   predniSONE (DELTASONE) 10 MG tablet   Bronchospasm       Balance disorder       Postural dizziness       Generalized weakness           HFU Dizziness, ataxia, weakness  COPD Flare, acute, bronchitis Stop Advair Start Breztri 2 puffs twice a day Trial on the Augmentin and the Prednisone taper for the breathing.  Continue with the [facility] PT as they have ordered. Hopefully can improve balance and strength again.  Keep up with other specialist. I agree if no significant back pain, can pause on the spinal specialist and injections.  We can re-evaluate before you are ready to return to work.  Would recommend either Short Term Disability or FMLA Incapacity continuous w/ intermittent would be needed.   Meds ordered this encounter  Medications   BREZTRI AEROSPHERE 160-9-4.8 MCG/ACT AERO    Sig: Inhale 2 puffs into the lungs 2 (two) times daily.    Dispense:  10.7 g    Refill:  11    amoxicillin-clavulanate (AUGMENTIN) 875-125 MG tablet    Sig: Take 1 tablet by mouth 2 (two) times daily.    Dispense:  20 tablet    Refill:  0   predniSONE (DELTASONE) 10 MG tablet    Sig: Take 6 tabs with breakfast Day 1, 5 tabs Day 2, 4 tabs Day 3, 3 tabs Day 4, 2 tabs Day 5, 1 tab Day 6.    Dispense:  21 tablet    Refill:  0    Follow up plan: Return in about 3 weeks (around 07/07/2022) for 3 weeks follow-up return to work.  [name], [facility] 06/16/2022, 11:47 AM

## 2022-06-16 NOTE — Patient Instructions (Addendum)
Thank you for coming to the office today.  Stop Advair Start Breztri 2 puffs twice a day  Trial on the Augmentin and the Prednisone taper for the breathing.  Continue with the [facility] PT as they have ordered. Hopefully can improve balance and strength again.  Keep up with other specialist. I agree if no significant back pain, can pause on the spinal specialist and injections.  We can re-evaluate before you are ready to return to work.  Would recommend either Short Term Disability or FMLA Incapacity continuous w/ intermittent would be needed.   Please schedule a Follow-up Appointment to: Return in about 3 weeks (around 07/07/2022) for 3 weeks follow-up return to work.  If you have any other questions or concerns, please feel free to call the office or send a message through [place]. You may also schedule an earlier appointment if necessary.  Additionally, you may be receiving a survey about your experience at our office within a few days to 1 week by e-mail or mail. We value your feedback.  [name], DO [place]

## 2022-06-20 ENCOUNTER — Telehealth: Payer: Self-pay

## 2022-06-20 DIAGNOSIS — R2689 Other abnormalities of gait and mobility: Secondary | ICD-10-CM

## 2022-06-20 DIAGNOSIS — R531 Weakness: Secondary | ICD-10-CM

## 2022-06-20 DIAGNOSIS — R42 Dizziness and giddiness: Secondary | ICD-10-CM

## 2022-06-20 NOTE — Telephone Encounter (Signed)
Ordered referral to [facility], [facility] 06/20/2022, 4:58 PM

## 2022-06-20 NOTE — Telephone Encounter (Signed)
Copied from [place] [phone]. Topic: Referral - Status >> Jun 20, 2022  3:58 PM Ja-Kwan M wrote: Reason for CRM: Pt stated he received a referral to physical therapy from the hospital but the location does not accept hospital referrals. Pt requests that Dr. Raliegh Ip submit referral for physical therapy

## 2022-06-21 NOTE — Therapy (Signed)
OUTPATIENT PHYSICAL THERAPY VESTIBULAR EVALUATION   Patient Name: Henry Horne MRN: [id number] DOB:Feb 28, 1951, 71 y.o., male Today's Date: 06/23/2022  PCP: Olin Hauser, DO REFERRING PROVIDER: [name] *   PT End of Session - 06/22/22 0808     Visit Number 1    Number of Visits 17    Date for PT Re-Evaluation 08/17/22    Authorization Type eval: 06/22/22    PT Start Time 0810    PT Stop Time 0845    PT Time Calculation (min) 35 min    Activity Tolerance Patient tolerated treatment well    Behavior During Therapy [facility] for tasks assessed/performed             Past Medical History:  Diagnosis Date   Anxiety    Cancer of kidney ([place]) 11/28/2008   cystoscopy 2010, 2014, 2015, 2016- MD q 6 months   Depression    Emphysema of lung ([place]) 11/28/1994   Hyperlipidemia    Hypertension    Joint pain    Prostate cancer ([place]) 11/28/2006   removed with cryotherapy   SDH (subdural hematoma) ([place])    Past Surgical History:  Procedure Laterality Date   DIGIT NAIL REMOVAL Left 2016   left and right foot -toe nails   Patient Active Problem List   Diagnosis Date Noted   HLD (hyperlipidemia) 06/13/2022   Depression with anxiety    Obesity with body mass index (BMI) of 30.0 to 39.9    Leukocytosis    Ataxia 05/20/2022   DM type 2 with diabetic peripheral neuropathy ([place]) 05/20/2022   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   Obesity (BMI 30.0-34.9) 01/17/2018   Osteoarthritis of multiple joints 10/04/2017   Asthma 12/30/2016   ACE-inhibitor cough 12/30/2016   Agitation 06/16/2016   Personal history of bladder cancer 05/12/2016   Type 2 diabetes mellitus with peripheral neuropathy ([place]) 12/25/2015   Hyperlipidemia associated with type 2 diabetes mellitus ([place]) 11/02/2015   Asymptomatic bacteriuria 10/08/2015   Essential hypertension 07/29/2015   Urinary obstruction, not elsewhere classified 06/19/2013   Personal history of prostate cancer 12/19/2012    Family history of malignant neoplasm of prostate 12/12/2012   ED (erectile dysfunction) of organic origin 12/12/2012   Incomplete bladder emptying 12/12/2012   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   Malignant neoplasm of prostate ([place]) 12/12/2012   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 He struck his head and suffered a small subdural hematoma. Since that time pt reports that he has been having vertigo. He was seen by ENT and treated for positional vertigo which helped but his symptoms returned. He was in [place] two weekend ago and his family stopped at a rest area. After pt got out of his truck he states that "I could't get my legs to work." He ended up with an admission at [facility] due to ataxia but no clear etiology was identified. He had both a CT and MRI of his brain which were both negative for acute intracranial issues. Neurosurgery was consulted and pt  also had cervical, thoracic, and lumbar MRIs. Cervical and thoracic MRIs did not show any evidence of cord compression or myelopathy. Lumbar MRI showed no evidence of metastatic disease in the lumbar spine. Multifactorial moderate spinal canal stenosis and moderate left neural foraminal stenosis at L3-L4 with potential irritation either traversing L4 nerve root. Multifactorial moderate to severe spinal canal stenosis and moderate to severe bilateral neural foraminal stenosis at L4-L5 with potential  irritation of the either traversing L5 nerve root, particularly on the right where there is suspected impingement due to an inferiorly migrated disc extrusion. Small disc protrusion at L5-S1 which contacts but does not impinge the traversing left S1 nerve root. The findings on his lumbar MRI were not determined to be significant to his clinical presentation by neurosurgery. Pt reports that the ataxia symptoms have now resolved however he has been out of work since 06/13/22 due to symptoms. He was referred to OP PT from the hospital for dizziness. Pt had a follow-up appointment with his PCP and at that time he also reported a cough x 1.5 weeks with dyspnea which was attributed to a COPD flair. However at the time of PT evaluation he reports significant improvement in those symptoms. His past medical history is significant for hypertension, hyperlipidemia, diabetes mellitus, asthma, GERD, depression with anxiety, prostate cancer (s/p cryotherapy), bladder cancer, obesity with  BMI 34.72, and subdural hematoma in March due to fall.  Provocative Factors: Getting up out of bed Easing Factors: waiting for symptoms to resolved  Auditory complaints (tinnitus, pain, drainage, hearing loss, aural fullness): Yes, occasional pain in L ear as well as "tightness" in L ear. 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 He wears reading glasses Chest pain/palpitations: No History of head injury/concussion: Yes, fall with head injury and subdural hematoma Stress/anxiety: Yes, history of anxiety Headaches/migraines: None  Has patient fallen in last 6 months? Yes, Number of falls: 1 Pertinent pain: No Dominant hand: left Imaging: Yes, see history Prior level of function: Independent Hobbies: Pt denies any hobbies  Red Flags: (dysarthria, dysphagia, drop attacks, chills, fevers, night sweats, bowel and bladder  changes, recent weight loss/gain) Review of systems negative for red flags with the exception of occasional difficulty swallowing. Pt reports that these symptoms are chronic and not new  PRECAUTIONS: None  WEIGHT BEARING RESTRICTIONS No  LIVING ENVIRONMENT: Lives with: lives with their spouse Lives in: House/apartment   PATIENT GOALS: Decreased dizziness symptoms   OBJECTIVE  EXAMINATION  POSTURE: No gross deficits contributing to symptoms  NEUROLOGICAL SCREEN: (2+ unless otherwise noted.) N=normal  Ab=abnormal  Level Dermatome R L Myotome R L Reflex R L  C3 Anterior Neck N N Sidebend C2-3 N N Jaw CN V    C4 Top of Shoulder N N Shoulder Shrug C4 N N Hoffman's UMN    C5 Lateral Upper Arm N N Shoulder ABD C4-5 N N Biceps C5-6    C6 Lateral Arm/ Thumb N N Arm Flex/ Wrist Ext C5-6 N N Brachiorad. C5-6    C7 Middle Finger N N Arm Ext//Wrist Flex C6-7 N N Triceps C7    C8 4th & 5th Finger N N Flex/ Ext Carpi Ulnaris C8 N N Patellar (L3-4)    T1 Medial Arm N N Interossei T1 N N Gastrocnemius    L2 Medial thigh/groin N N Illiopsoas (L2-3) N N     L3 Lower thigh/med.knee N N Quadriceps (L3-4)  N N     L4 Medial leg/lat thigh N N Tibialis Ant (L4-5) N N     L5 Lat. leg & dorsal foot N N EHL (L5) N N     S1 post/lat foot/thigh/leg N N Gastrocnemius (S1-2) N N     S2 Post./med. thigh & leg N N Hamstrings (L4-S3) N N       CRANIAL NERVES II, III, IV, VI: Pupils equal and reactive to light, visual acuity and visual fields are intact, extraocular muscles are intact  V: Facial sensation is intact and symmetric bilaterally  VII: Facial strength is intact and symmetric bilaterally  VIII: Hearing is normal as tested by gross conversation IX, X: Palate elevates midline, normal phonation, uvula midline XI: Shoulder shrug strength is intact  XII: Tongue protrudes midline    SOMATOSENSORY Grossly intact to light touch bilateral UEs/LEs as determined by testing dermatomes C2-T2 and L2-S2.  Proprioception and hot/cold testing deferred on this date.   COORDINATION Finger to Nose: Normal Heel to Shin: Normal Pronator Drift: Negative Rapid Alternating Movements: Normal Finger to Thumb Opposition: Normal    RANGE OF MOTION Cervical Spine AROM is limited in all directions but painless. No focal deficits in AROM noted in BUE/BLE   MANUAL MUSCLE TESTING BUE/BLE strength WNL without focal deficits   TRANSFERS/GAIT Independent for transfers and ambulation without assistive device    PATIENT SURVEYS FOTO: 52, predicted improvement to 58  DHI: 22/100   POSTURAL CONTROL TESTS  Clinical Test of Sensory Interaction for Balance (CTSIB): Deferred   OCULOMOTOR / VESTIBULAR TESTING  Oculomotor Exam- Room Light: Deferred  Oculomotor Exam- Fixation Suppressed: Deferred  BPPV TESTS:  Symptoms Duration Intensity Nystagmus  L Dix-Hallpike None   A few downbeating L torsional beats  R Dix-Hallpike Dizziness 10-15s Mild Upbeating R torsional nystagmus  L Head Roll None   None  R Head Roll None   None  L Sidelying Test      R Sidelying Test      (blank = not tested)   FUNCTIONAL OUTCOME MEASURES   Results Comments  BERG    DGI    FGA    TUG    5TSTS    6 Minute Walk Test    10 Meter Gait Speed    (blank = not tested)   TODAY'S TREATMENT   Canalith Repositioning Treatment Pt treated with 2 bouts of Epley Maneuver (inverted mat table) for presumed R posterior canal BPPV. One minute holds in each position and retesting between maneuvers. After first maneuver pt continues to present with upbeating R torsional nystagmus but denies dizziness. Pt provided handout with BPPV education.    ASSESSMENT:  CLINICAL IMPRESSION: Patient is a 71 y.o. male who was seen today for physical therapy evaluation and treatment for dizzness. Evaluation began slightly later than scheduled so examination was somewhat limited. Pt has a positive R Dix-hallpike test for dizziness and  upbeating R torsional nystagmus. Treated with two bouts of Epley maneuver during visit today. Will complete additional vestibular and oculomotor testing during first follow-up visit. Objective impairments include decreased balance, dizziness, and obesity. These impairments are limiting patient from driving, community activity, and occupation. Personal factors including Age, Past/current experiences, Time since onset of injury/illness/exacerbation, and 3+ comorbidities: anxiety, depression, DM, subdural hematoma, prostate cancer, COPD, and obesity  are also affecting patient's functional outcome. Patient will benefit from skilled PT to address above impairments and improve overall function.  REHAB POTENTIAL: Good  CLINICAL DECISION MAKING: Unstable/unpredictable  EVALUATION COMPLEXITY: High   GOALS: Goals reviewed with patient? No  SHORT TERM GOALS: Target date: 07/20/2022   Pt will be independent with HEP for dizziness in order to decrease symptoms, improve balance,decrease fall risk, and improve function at home. Baseline: Goal status: INITIAL   LONG TERM GOALS: Target date: 08/17/2022   Pt will increase FOTO to at least 58 to demonstrate significant improvement in function at home related to dizziness.  Baseline: 06/22/22: 52 Goal status: INITIAL  2.  Pt will decrease DHI score by at least 18 points in order to demonstrate clinically significant reduction in disability related to dizziness.  Baseline: 06/22/22: 22/100 Goal status: INITIAL  3. Pt will improve FGA by at least 4 points in order to demonstrate clinically significant improvement in balance and decreased risk for falls.     Baseline: 06/22/22: To be completed Goal status: INITIAL  4. Pt will report at least 75% improvement in his symptoms in order to return to full function at home and work with less symptoms. Baseline:  Goal status: INITIAL   PLAN:  PT FREQUENCY: 2x/week  PT DURATION: 8 weeks  PLANNED  INTERVENTIONS: Therapeutic exercises, Therapeutic activity, Neuromuscular re-education, Balance training, Gait training, Patient/Family education, Joint manipulation, Joint mobilization, Canalith repositioning, Aquatic Therapy, Dry Needling, Cognitive remediation, Electrical stimulation, Spinal manipulation, Spinal mobilization, Cryotherapy, Moist heat, Traction, Ultrasound, Ionotophoresis '4mg'$ /ml Dexamethasone, and Manual therapy  PLAN FOR NEXT SESSION: oculomotor/vestibular testing with and without fixation suppression, have pt complete ABC, FGA, 5TSTS, recheck BPPV testing and treat as indicated.   Lyndel Safe Aava Deland PT, DPT, GCS  [place] 06/23/2022, 8:42 AM

## 2022-06-22 ENCOUNTER — Ambulatory Visit: Payer: BC Managed Care – PPO | Attending: Family Medicine

## 2022-06-22 DIAGNOSIS — R531 Weakness: Secondary | ICD-10-CM | POA: Diagnosis not present

## 2022-06-22 DIAGNOSIS — R2689 Other abnormalities of gait and mobility: Secondary | ICD-10-CM | POA: Insufficient documentation

## 2022-06-22 DIAGNOSIS — R42 Dizziness and giddiness: Secondary | ICD-10-CM | POA: Insufficient documentation

## 2022-06-23 ENCOUNTER — Other Ambulatory Visit: Payer: BC Managed Care – PPO

## 2022-06-27 NOTE — Therapy (Signed)
OUTPATIENT PHYSICAL THERAPY VESTIBULAR TREATMENT   Patient Name: Henry Horne MRN: [id number] DOB:1950/12/15, 71 y.o., male Today's Date: 06/30/2022  PCP: Olin Hauser, DO REFERRING PROVIDER: [name] *   PT End of Session - 06/30/22 0836     Visit Number 2    Number of Visits 17    Date for PT Re-Evaluation 08/17/22    Authorization Type eval: 06/22/22    PT Start Time 1150    PT Stop Time 1225    PT Time Calculation (min) 35 min    Activity Tolerance Patient tolerated treatment well    Behavior During Therapy [facility] for tasks assessed/performed              Past Medical History:  Diagnosis Date   Anxiety    Cancer of kidney ([place]) 11/28/2008   cystoscopy 2010, 2014, 2015, 2016- MD q 6 months   Depression    Emphysema of lung ([place]) 11/28/1994   Hyperlipidemia    Hypertension    Joint pain    Prostate cancer ([place]) 11/28/2006   removed with cryotherapy   SDH (subdural hematoma) ([place])    Past Surgical History:  Procedure Laterality Date   DIGIT NAIL REMOVAL Left 2016   left and right foot -toe nails   Patient Active Problem List   Diagnosis Date Noted   HLD (hyperlipidemia) 06/13/2022   Depression with anxiety    Obesity with body mass index (BMI) of 30.0 to 39.9    Leukocytosis    Ataxia 05/20/2022   DM type 2 with diabetic peripheral neuropathy ([place]) 05/20/2022   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   Obesity (BMI 30.0-34.9) 01/17/2018   Osteoarthritis of multiple joints 10/04/2017   Asthma 12/30/2016   ACE-inhibitor cough 12/30/2016   Agitation 06/16/2016   Personal history of bladder cancer 05/12/2016   Type 2 diabetes mellitus with peripheral neuropathy ([place]) 12/25/2015   Hyperlipidemia associated with type 2 diabetes mellitus ([place]) 11/02/2015   Asymptomatic bacteriuria 10/08/2015   Essential hypertension 07/29/2015   Urinary obstruction, not elsewhere classified 06/19/2013   Personal history of prostate cancer 12/19/2012    Family history of malignant neoplasm of prostate 12/12/2012   ED (erectile dysfunction) of organic origin 12/12/2012   Incomplete bladder emptying 12/12/2012   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   Malignant neoplasm of prostate ([place]) 12/12/2012   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 He struck his head and suffered a small subdural hematoma. Since that time pt reports that he has been having vertigo. He was seen by ENT and treated for positional vertigo which helped but his symptoms returned. He was in [place] two weekend ago and his family stopped at a rest area. After pt got out of his truck he states that "I could't get my legs to work." He ended up with an admission at [facility] due to ataxia but no clear etiology was identified. He had both a CT and MRI of his brain which were both negative for acute intracranial issues. Neurosurgery was consulted and  pt also had cervical, thoracic, and lumbar MRIs. Cervical and thoracic MRIs did not show any evidence of cord compression or myelopathy. Lumbar MRI showed no evidence of metastatic disease in the lumbar spine. Multifactorial moderate spinal canal stenosis and moderate left neural foraminal stenosis at L3-L4 with potential irritation either traversing L4 nerve root. Multifactorial moderate to severe spinal canal stenosis and moderate to severe bilateral neural foraminal stenosis at L4-L5 with potential  irritation of the either traversing L5 nerve root, particularly on the right where there is suspected impingement due to an inferiorly migrated disc extrusion. Small disc protrusion at L5-S1 which contacts but does not impinge the traversing left S1 nerve root. The findings on his lumbar MRI were not determined to be significant to his clinical presentation by neurosurgery. Pt reports that the ataxia symptoms have now resolved however he has been out of work since 06/13/22 due to symptoms. He was referred to OP PT from the hospital for dizziness. Pt had a follow-up appointment with his PCP and at that time he also reported a cough x 1.5 weeks with dyspnea which was attributed to a COPD flair. However at the time of PT evaluation he reports significant improvement in those symptoms. His past medical history is significant for hypertension, hyperlipidemia, diabetes mellitus, asthma, GERD, depression with anxiety, prostate cancer (s/p cryotherapy), bladder cancer, obesity with  BMI 34.72, and subdural hematoma in March due to fall.  Provocative Factors: Getting up out of bed Easing Factors: waiting for symptoms to resolved  Auditory complaints (tinnitus, pain, drainage, hearing loss, aural fullness): Yes, occasional pain in L ear as well as "tightness" in L ear. 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 He wears reading glasses Chest pain/palpitations: No History of head injury/concussion: Yes, fall with head injury and subdural hematoma Stress/anxiety: Yes, history of anxiety Headaches/migraines: None  Has patient fallen in last 6 months? Yes, Number of falls: 1 Pertinent pain: No Dominant hand: left Imaging: Yes, see history Prior level of function: Independent Hobbies: Pt denies any hobbies  Red Flags: (dysarthria, dysphagia, drop attacks, chills, fevers, night sweats, bowel and bladder  changes, recent weight loss/gain) Review of systems negative for red flags with the exception of occasional difficulty swallowing. Pt reports that these symptoms are chronic and not new  PRECAUTIONS: None  WEIGHT BEARING RESTRICTIONS No  LIVING ENVIRONMENT: Lives with: lives with their spouse Lives in: House/apartment   PATIENT GOALS: Decreased dizziness symptoms   OBJECTIVE  EXAMINATION  POSTURE: No gross deficits contributing to symptoms  NEUROLOGICAL SCREEN: (2+ unless otherwise noted.) N=normal  Ab=abnormal  Level Dermatome R L Myotome R L Reflex R L  C3 Anterior Neck N N Sidebend C2-3 N N Jaw CN V    C4 Top of Shoulder N N Shoulder Shrug C4 N N Hoffman's UMN    C5 Lateral Upper Arm N N Shoulder ABD C4-5 N N Biceps C5-6    C6 Lateral Arm/ Thumb N N Arm Flex/ Wrist Ext C5-6 N N Brachiorad. C5-6    C7 Middle Finger N N Arm Ext//Wrist Flex C6-7 N N Triceps C7    C8 4th & 5th Finger N N Flex/ Ext Carpi Ulnaris C8 N N Patellar (L3-4)    T1 Medial Arm N N Interossei T1 N N Gastrocnemius    L2 Medial thigh/groin N N Illiopsoas (L2-3) N N     L3 Lower thigh/med.knee N N Quadriceps (  L3-4) N N     L4 Medial leg/lat thigh N N Tibialis Ant (L4-5) N N     L5 Lat. leg & dorsal foot N N EHL (L5) N N     S1 post/lat foot/thigh/leg N N Gastrocnemius (S1-2) N N     S2 Post./med. thigh & leg N N Hamstrings (L4-S3) N N       CRANIAL NERVES II, III, IV, VI: Pupils equal and reactive to light, visual acuity and visual fields are intact, extraocular muscles are intact  V: Facial sensation is intact and symmetric bilaterally  VII: Facial strength is intact and symmetric bilaterally  VIII: Hearing is normal as tested by gross conversation IX, X: Palate elevates midline, normal phonation, uvula midline XI: Shoulder shrug strength is intact  XII: Tongue protrudes midline    SOMATOSENSORY Grossly intact to light touch bilateral UEs/LEs as determined by testing dermatomes C2-T2 and L2-S2.  Proprioception and hot/cold testing deferred on this date.   COORDINATION Finger to Nose: Normal Heel to Shin: Normal Pronator Drift: Negative Rapid Alternating Movements: Normal Finger to Thumb Opposition: Normal    RANGE OF MOTION Cervical Spine AROM is limited in all directions but painless. No focal deficits in AROM noted in BUE/BLE   MANUAL MUSCLE TESTING BUE/BLE strength WNL without focal deficits   TRANSFERS/GAIT Independent for transfers and ambulation without assistive device    PATIENT SURVEYS FOTO: 52, predicted improvement to 58  DHI: 22/100 ABC: 80.6% (06/29/22)   POSTURAL CONTROL TESTS  Clinical Test of Sensory Interaction for Balance (CTSIB): Deferred   OCULOMOTOR / VESTIBULAR TESTING  Oculomotor Exam- Room Light: Deferred  Oculomotor Exam- Fixation Suppressed: Deferred  BPPV TESTS:  Symptoms Duration Intensity Nystagmus  L Dix-Hallpike None   A few downbeating L torsional beats  R Dix-Hallpike Dizziness 10-15s Mild Upbeating R torsional nystagmus  L Head Roll None   None  R Head Roll None   None  L Sidelying Test      R Sidelying Test      (blank = not tested)   FUNCTIONAL OUTCOME MEASURES   Results Comments  BERG    DGI    FGA    TUG    5TSTS    6 Minute Walk Test    10 Meter Gait Speed    (blank = not tested)   TODAY'S TREATMENT    SUBJECTIVE: Pt reports that he is doing well today. He has not experienced any notable episodes of vertigo since the initial evaluation. He continues to sleep on a wedge a home. No significant pain reported upon arrival. No specific questions or concerns upon arrival   Canalith Repositioning Treatment Dix-Hallpike test is negative bilaterally for vertigo and nystagmus. Given prior positive on R side pt treated with 2 bouts of Epley Maneuver (inverted mat table) for presumed R posterior canal BPPV. Two minute holds in each position and retesting between maneuvers. After first maneuver during retesting pt  continues to deny vertigo however 10-15s worth of faint R torsional nystagmus observed. Completed second maneuver and then performed roll test which is negative bilaterally for both vertigo and nystagmus.   Neuromuscular Re-education   OCULOMOTOR / VESTIBULAR TESTING  Oculomotor Exam- Room Light  Findings Comments  Ocular Alignment normal   Ocular ROM normal   Spontaneous Nystagmus normal   Gaze-Holding Nystagmus normal   End-Gaze Nystagmus normal   Vergence (normal 2-3") not examined   Smooth Pursuit abnormal Saccadic intrusions  Cross-Cover Test not examined   Saccades  abnormal Normal horizontal saccades, slow vertical saccades  VOR Cancellation normal   Left Head Impulse normal   Right Head Impulse normal   Static Acuity not examined   Dynamic Acuity not examined     Oculomotor Exam- Fixation Suppressed  Findings Comments  Ocular Alignment normal   Spontaneous Nystagmus normal   Gaze-Holding Nystagmus normal   End-Gaze Nystagmus normal   Head Shaking Nystagmus normal   Pressure-Induced Nystagmus not examined   Hyperventilation Induced Nystagmus not examined   Skull Vibration Induced Nystagmus not examined      ASSESSMENT:  CLINICAL IMPRESSION: Dix-Hallpike test is negative bilaterally for vertigo and nystagmus. Given prior positive on R side pt treated with 2 bouts of Epley Maneuver (inverted mat table) for presumed R posterior canal BPPV. Two minute holds in each position and retesting between maneuvers. After first maneuver during retesting pt continues to deny vertigo however 10-15s worth of faint R torsional nystagmus observed. Completed second maneuver and then performed roll test which is negative bilaterally for both vertigo and nystagmus. Proceeded to perform additional oculomotor/vestibular testing with and without fixation which is all normal with the exception of slow vertical saccade testing and saccadic intrusions during smooth pursuit finger follow. Both of  these findings are indicative of central etiology for symptoms. Pt advised to discontinue wedge at home to see if symptoms recur. Patient will benefit from skilled PT to address above impairments and improve overall function.  REHAB POTENTIAL: Good  CLINICAL DECISION MAKING: Unstable/unpredictable  EVALUATION COMPLEXITY: High   GOALS: Goals reviewed with patient? No  SHORT TERM GOALS: Target date: 07/20/2022   Pt will be independent with HEP for dizziness in order to decrease symptoms, improve balance,decrease fall risk, and improve function at home. Baseline: Goal status: INITIAL   LONG TERM GOALS: Target date: 08/17/2022   Pt will increase FOTO to at least 58 to demonstrate significant improvement in function at home related to dizziness.  Baseline: 06/22/22: 52 Goal status: INITIAL  2.  Pt will decrease DHI score by at least 18 points in order to demonstrate clinically significant reduction in disability related to dizziness.  Baseline: 06/22/22: 22/100 Goal status: INITIAL  3. Pt will improve FGA by at least 4 points in order to demonstrate clinically significant improvement in balance and decreased risk for falls.     Baseline: 06/22/22: To be completed Goal status: INITIAL  4. Pt will report at least 75% improvement in his symptoms in order to return to full function at home and work with less symptoms. Baseline:  Goal status: INITIAL   PLAN:  PT FREQUENCY: 2x/week  PT DURATION: 8 weeks  PLANNED INTERVENTIONS: Therapeutic exercises, Therapeutic activity, Neuromuscular re-education, Balance training, Gait training, Patient/Family education, Joint manipulation, Joint mobilization, Canalith repositioning, Aquatic Therapy, Dry Needling, Cognitive remediation, Electrical stimulation, Spinal manipulation, Spinal mobilization, Cryotherapy, Moist heat, Traction, Ultrasound, Ionotophoresis '4mg'$ /ml Dexamethasone, and Manual therapy  PLAN FOR NEXT SESSION: recheck BPPV testing and  treat as indicated, FGA, additional balance testing as indicated;   Lyndel Safe Shaelee Forni PT, DPT, GCS  Tyshawn Ciullo 06/30/2022, 8:44 AM

## 2022-06-29 ENCOUNTER — Ambulatory Visit: Payer: BC Managed Care – PPO | Attending: Family Medicine

## 2022-06-29 DIAGNOSIS — R42 Dizziness and giddiness: Secondary | ICD-10-CM | POA: Insufficient documentation

## 2022-07-03 NOTE — Therapy (Signed)
OUTPATIENT PHYSICAL THERAPY VESTIBULAR TREATMENT   Patient Name: Henry Horne MRN: [id number] DOB:December 17, 1950, 71 y.o., male Today's Date: 07/04/2022  PCP: Olin Hauser, DO REFERRING PROVIDER: [name] *   PT End of Session - 07/04/22 1038     Visit Number 3    Number of Visits 17    Date for PT Re-Evaluation 08/17/22    Authorization Type eval: 06/22/22    PT Start Time 23    PT Stop Time 1100    PT Time Calculation (min) 35 min    Activity Tolerance Patient tolerated treatment well    Behavior During Therapy [facility] for tasks assessed/performed               Past Medical History:  Diagnosis Date   Anxiety    Cancer of kidney ([place]) 11/28/2008   cystoscopy 2010, 2014, 2015, 2016- MD q 6 months   Depression    Emphysema of lung ([place]) 11/28/1994   Hyperlipidemia    Hypertension    Joint pain    Prostate cancer ([place]) 11/28/2006   removed with cryotherapy   SDH (subdural hematoma) ([place])    Past Surgical History:  Procedure Laterality Date   DIGIT NAIL REMOVAL Left 2016   left and right foot -toe nails   Patient Active Problem List   Diagnosis Date Noted   HLD (hyperlipidemia) 06/13/2022   Depression with anxiety    Obesity with body mass index (BMI) of 30.0 to 39.9    Leukocytosis    Ataxia 05/20/2022   DM type 2 with diabetic peripheral neuropathy ([place]) 05/20/2022   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   Obesity (BMI 30.0-34.9) 01/17/2018   Osteoarthritis of multiple joints 10/04/2017   Asthma 12/30/2016   ACE-inhibitor cough 12/30/2016   Agitation 06/16/2016   Personal history of bladder cancer 05/12/2016   Type 2 diabetes mellitus with peripheral neuropathy ([place]) 12/25/2015   Hyperlipidemia associated with type 2 diabetes mellitus ([place]) 11/02/2015   Asymptomatic bacteriuria 10/08/2015   Essential hypertension 07/29/2015   Urinary obstruction, not elsewhere classified 06/19/2013   Personal history of prostate cancer 12/19/2012    Family history of malignant neoplasm of prostate 12/12/2012   ED (erectile dysfunction) of organic origin 12/12/2012   Incomplete bladder emptying 12/12/2012   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 He struck his head and suffered a small subdural hematoma. Since that time pt reports that he has been having vertigo. He was seen by ENT and treated for positional vertigo which helped but his symptoms returned. He was in [place] two weekend ago and his family stopped at a rest area. After pt got out of his truck he states that "I could't get my legs to work." He ended up with an admission at [facility] due to ataxia but no clear etiology was identified. He had both a CT and MRI of his brain which were both negative for acute intracranial issues. Neurosurgery was consulted  and pt also had cervical, thoracic, and lumbar MRIs. Cervical and thoracic MRIs did not show any evidence of cord compression or myelopathy. Lumbar MRI showed no evidence of metastatic disease in the lumbar spine. Multifactorial moderate spinal canal stenosis and moderate left neural foraminal stenosis at L3-L4 with potential irritation either traversing L4 nerve root. Multifactorial moderate to severe spinal canal stenosis and moderate to severe bilateral neural foraminal stenosis at L4-L5 with potential  irritation of the either traversing L5 nerve root, particularly on the right where there is suspected impingement due to an inferiorly migrated disc extrusion. Small disc protrusion at L5-S1 which contacts but does not impinge the traversing left S1 nerve root. The findings on his lumbar MRI were not determined to be significant to his clinical presentation by neurosurgery. Pt reports that the ataxia symptoms have now resolved however he has been out of work since 06/13/22 due to symptoms. He was referred to OP PT from the hospital for dizziness. Pt had a follow-up appointment with his PCP and at that time he also reported a cough x 1.5 weeks with dyspnea which was attributed to a COPD flair. However at the time of PT evaluation he reports significant improvement in those symptoms. His past medical history is significant for hypertension, hyperlipidemia, diabetes mellitus, asthma, GERD, depression with anxiety, prostate cancer (s/p cryotherapy), bladder cancer, obesity with  BMI 34.72, and subdural hematoma in March due to fall.  Provocative Factors: Getting up out of bed Easing Factors: waiting for symptoms to resolved  Auditory complaints (tinnitus, pain, drainage, hearing loss, aural fullness): Yes, occasional pain in L ear as well as "tightness" in L ear. 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 He wears reading glasses Chest pain/palpitations: No History of head injury/concussion: Yes, fall with head injury and subdural hematoma Stress/anxiety: Yes, history of anxiety Headaches/migraines: None  Has patient fallen in last 6 months? Yes, Number of falls: 1 Pertinent pain: No Dominant hand: left Imaging: Yes, see history Prior level of function: Independent Hobbies: Pt denies any hobbies  Red Flags: (dysarthria, dysphagia, drop attacks, chills, fevers, night sweats, bowel and bladder  changes, recent weight loss/gain) Review of systems negative for red flags with the exception of occasional difficulty swallowing. Pt reports that these symptoms are chronic and not new  PRECAUTIONS: None  WEIGHT BEARING RESTRICTIONS No  LIVING ENVIRONMENT: Lives with: lives with their spouse Lives in: House/apartment   PATIENT GOALS: Decreased dizziness symptoms   OBJECTIVE  EXAMINATION  POSTURE: No gross deficits contributing to symptoms  NEUROLOGICAL SCREEN: (2+ unless otherwise noted.) N=normal  Ab=abnormal  Level Dermatome R L Myotome R L Reflex R L  C3 Anterior Neck N N Sidebend C2-3 N N Jaw CN V    C4 Top of Shoulder N N Shoulder Shrug C4 N N Hoffman's UMN    C5 Lateral Upper Arm N N Shoulder ABD C4-5 N N Biceps C5-6    C6 Lateral Arm/ Thumb N N Arm Flex/ Wrist Ext C5-6 N N Brachiorad. C5-6    C7 Middle Finger N N Arm Ext//Wrist Flex C6-7 N N Triceps C7    C8 4th & 5th Finger N N Flex/ Ext Carpi Ulnaris C8 N N Patellar (L3-4)    T1 Medial Arm N N Interossei T1 N N Gastrocnemius    L2 Medial thigh/groin N N Illiopsoas (L2-3) N N     L3 Lower thigh/med.knee N N  Quadriceps (L3-4) N N     L4 Medial leg/lat thigh N N Tibialis Ant (L4-5) N N     L5 Lat. leg & dorsal foot N N EHL (L5) N N     S1 post/lat foot/thigh/leg N N Gastrocnemius (S1-2) N N     S2 Post./med. thigh & leg N N Hamstrings (L4-S3) N N       CRANIAL NERVES II, III, IV, VI: Pupils equal and reactive to light, visual acuity and visual fields are intact, extraocular muscles are intact  V: Facial sensation is intact and symmetric bilaterally  VII: Facial strength is intact and symmetric bilaterally  VIII: Hearing is normal as tested by gross conversation IX, X: Palate elevates midline, normal phonation, uvula midline XI: Shoulder shrug strength is intact  XII: Tongue protrudes midline    SOMATOSENSORY Grossly intact to light touch bilateral UEs/LEs as determined by testing dermatomes C2-T2 and L2-S2.  Proprioception and hot/cold testing deferred on this date.   COORDINATION Finger to Nose: Normal Heel to Shin: Normal Pronator Drift: Negative Rapid Alternating Movements: Normal Finger to Thumb Opposition: Normal    RANGE OF MOTION Cervical Spine AROM is limited in all directions but painless. No focal deficits in AROM noted in BUE/BLE   MANUAL MUSCLE TESTING BUE/BLE strength WNL without focal deficits   TRANSFERS/GAIT Independent for transfers and ambulation without assistive device    PATIENT SURVEYS FOTO: 52, predicted improvement to 58  DHI: 22/100 ABC: 80.6% (06/29/22)   POSTURAL CONTROL TESTS  Clinical Test of Sensory Interaction for Balance (CTSIB): Deferred   OCULOMOTOR / VESTIBULAR TESTING  Oculomotor Exam- Room Light: Deferred  Oculomotor Exam- Fixation Suppressed: Deferred  BPPV TESTS:  Symptoms Duration Intensity Nystagmus  L Dix-Hallpike None   A few downbeating L torsional beats  R Dix-Hallpike Dizziness 10-15s Mild Upbeating R torsional nystagmus  L Head Roll None   None  R Head Roll None   None  L Sidelying Test      R Sidelying Test      (blank = not tested)   FUNCTIONAL OUTCOME MEASURES   Results Comments  BERG    DGI    FGA    TUG    5TSTS    6 Minute Walk Test    10 Meter Gait Speed    (blank = not tested)   TODAY'S TREATMENT    SUBJECTIVE: Pt reports that he is doing well today. He reports approximately 70% improvement in his symptoms since starting with therapy. 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 The symptoms were very brief. He has stopped sleeping on a wedge for the last 3 nights without any recurrence of his symptoms. No significant pain reported upon arrival. He returns to see Dr. Parks Ranger on 07/14/22 to see if he can be cleared to return to work. No specific questions or concerns upon  arrival   Canalith Repositioning Treatment Dix-Hallpike, Roll, and Sidelying Tests are all negative bilaterally for vertigo and nystagmus. Given prior positive testing on R side pt treated with 2 bouts of Epley Maneuver (inverted mat table) for presumed R posterior canal BPPV. One minute holds in each position and retesting between maneuvers. After first maneuver during retesting pt continues to deny vertigo and no nystagmus observed. Second maneuver performed and then pt completed FOTO survey and scored 62. Predicted improvement score was 58. Pt will  return for one additional therapy session as needed next week in order to determine if pt has achieved full resolution of his symptoms and to perform additional testing as needed. He returns to his PCP on 07/14/22 and plans to request clearance to return to work. Patient will benefit from skilled PT to address above impairments and improve overall function.     ASSESSMENT:  CLINICAL IMPRESSION: Pt reports at least 70% improvement in his dizziness symptoms at this time. Dix-Hallpike, Roll, and Sidelying Tests are all negative bilaterally for vertigo and nystagmus. Given prior positive testing on R side pt treated with 2 bouts of Epley Maneuver (inverted mat table) for presumed R posterior canal BPPV. One minute holds in each position and retesting between maneuvers. After first maneuver during retesting pt continues to deny vertigo and no nystagmus observed. Second maneuver performed and then pt completed FOTO survey and scored 62. Predicted improvement score was 58. Pt will return for one additional therapy session as needed next week in order to determine if pt has achieved full resolution of his symptoms and to perform additional testing as needed. He returns to his PCP on 07/14/22 and plans to request clearance to return to work. Patient will benefit from skilled PT to address above impairments and improve overall function.  REHAB POTENTIAL:  Good  CLINICAL DECISION MAKING: Unstable/unpredictable  EVALUATION COMPLEXITY: High   GOALS: Goals reviewed with patient? No  SHORT TERM GOALS: Target date: 07/20/2022   Pt will be independent with HEP for dizziness in order to decrease symptoms, improve balance,decrease fall risk, and improve function at home. Baseline: Goal status: ACHIEVED   LONG TERM GOALS: Target date: 08/17/2022   Pt will increase FOTO to at least 58 to demonstrate significant improvement in function at home related to dizziness.  Baseline: 06/22/22: 52; 07/04/22: 62 Goal status: ACHIEVED  2.  Pt will decrease DHI score by at least 18 points in order to demonstrate clinically significant reduction in disability related to dizziness.  Baseline: 06/22/22: 22/100; 07/04/22: Deferred Goal status: ONGOING  3. Pt will improve FGA by at least 4 points in order to demonstrate clinically significant improvement in balance and decreased risk for falls.     Baseline: 06/22/22: To be completed; 07/04/22: Deferred Goal status: ONGOING  4. Pt will report at least 75% improvement in his symptoms in order to return to full function at home and work with less symptoms. Baseline: 07/04/22: >70% improvement Goal status: PARTIALLY MET   PLAN:  PT FREQUENCY: 2x/week  PT DURATION: 8 weeks  PLANNED INTERVENTIONS: Therapeutic exercises, Therapeutic activity, Neuromuscular re-education, Balance training, Gait training, Patient/Family education, Joint manipulation, Joint mobilization, Canalith repositioning, Aquatic Therapy, Dry Needling, Cognitive remediation, Electrical stimulation, Spinal manipulation, Spinal mobilization, Cryotherapy, Moist heat, Traction, Ultrasound, Ionotophoresis 46m/ml Dexamethasone, and Manual therapy  PLAN FOR NEXT SESSION: recheck BPPV testing and treat as indicated, FGA, have pt complete DHI, additional balance testing as indicated;   JLyndel SafeHuprich PT, DPT, GCS  Henry Horne 07/04/2022, 11:25 AM

## 2022-07-04 ENCOUNTER — Ambulatory Visit: Payer: BC Managed Care – PPO

## 2022-07-04 DIAGNOSIS — R42 Dizziness and giddiness: Secondary | ICD-10-CM

## 2022-07-05 ENCOUNTER — Ambulatory Visit: Payer: BC Managed Care – PPO | Admitting: Neurosurgery

## 2022-07-06 ENCOUNTER — Other Ambulatory Visit: Payer: Self-pay | Admitting: Family Medicine

## 2022-07-06 ENCOUNTER — Ambulatory Visit: Payer: BC Managed Care – PPO

## 2022-07-06 DIAGNOSIS — R42 Dizziness and giddiness: Secondary | ICD-10-CM

## 2022-07-06 NOTE — Telephone Encounter (Signed)
Tofranil discontinued 06/13/2022 so this refill request has been refused.   No longer on medication list either.

## 2022-07-07 ENCOUNTER — Ambulatory Visit: Payer: BC Managed Care – PPO | Admitting: Neurosurgery

## 2022-07-11 ENCOUNTER — Ambulatory Visit: Payer: BC Managed Care – PPO

## 2022-07-11 NOTE — Therapy (Signed)
OUTPATIENT PHYSICAL THERAPY VESTIBULAR TREATMENT/DISCHARGE   Patient Name: Henry Horne MRN: [id number] DOB:Aug 26, 1951, 71 y.o., male Today's Date: 07/13/2022  PCP: Olin Hauser, DO REFERRING PROVIDER: [name] *   PT End of Session - 07/13/22 1023     Visit Number 4    Number of Visits 17    Date for PT Re-Evaluation 08/17/22    Authorization Type eval: 06/22/22    PT Start Time 1020    PT Stop Time 1100    PT Time Calculation (min) 40 min    Activity Tolerance Patient tolerated treatment well    Behavior During Therapy [facility] for tasks assessed/performed              Past Medical History:  Diagnosis Date   Anxiety    Cancer of kidney ([place]) 11/28/2008   cystoscopy 2010, 2014, 2015, 2016- MD q 6 months   Depression    Emphysema of lung ([place]) 11/28/1994   Hyperlipidemia    Hypertension    Joint pain    Prostate cancer ([place]) 11/28/2006   removed with cryotherapy   SDH (subdural hematoma) ([place])    Past Surgical History:  Procedure Laterality Date   DIGIT NAIL REMOVAL Left 2016   left and right foot -toe nails   Patient Active Problem List   Diagnosis Date Noted   HLD (hyperlipidemia) 06/13/2022   Depression with anxiety    Obesity with body mass index (BMI) of 30.0 to 39.9    Leukocytosis    Ataxia 05/20/2022   DM type 2 with diabetic peripheral neuropathy ([place]) 05/20/2022   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   Obesity (BMI 30.0-34.9) 01/17/2018   Osteoarthritis of multiple joints 10/04/2017   Asthma 12/30/2016   ACE-inhibitor cough 12/30/2016   Agitation 06/16/2016   Personal history of bladder cancer 05/12/2016   Type 2 diabetes mellitus with peripheral neuropathy ([place]) 12/25/2015   Hyperlipidemia associated with type 2 diabetes mellitus ([place]) 11/02/2015   Asymptomatic bacteriuria 10/08/2015   Essential hypertension 07/29/2015   Urinary obstruction, not elsewhere classified 06/19/2013   Personal history of prostate cancer  12/19/2012   Family history of malignant neoplasm of prostate 12/12/2012   ED (erectile dysfunction) of organic origin 12/12/2012   Incomplete bladder emptying 12/12/2012   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   Malignant neoplasm of prostate ([place]) 12/12/2012   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 He struck his head and suffered a small subdural hematoma. Since that time pt reports that he has been having vertigo. He was seen by ENT and treated for positional vertigo which helped but his symptoms returned. He was in [place] two weekend ago and his family stopped at a rest area. After pt got out of his truck he states that "I could't get my legs to work." He ended up with an admission at [facility] due to ataxia but no clear etiology was identified. He had both a CT and MRI of his brain which were both negative for acute intracranial issues. Neurosurgery was consulted and  pt also had cervical, thoracic, and lumbar MRIs. Cervical and thoracic MRIs did not show any evidence of cord compression or myelopathy. Lumbar MRI showed no evidence of metastatic disease in the lumbar spine. Multifactorial moderate spinal canal stenosis and moderate left neural foraminal stenosis at L3-L4 with potential irritation either traversing L4 nerve root. Multifactorial moderate to severe spinal canal stenosis and moderate to severe bilateral neural foraminal stenosis at L4-L5  with potential irritation of the either traversing L5 nerve root, particularly on the right where there is suspected impingement due to an inferiorly migrated disc extrusion. Small disc protrusion at L5-S1 which contacts but does not impinge the traversing left S1 nerve root. The findings on his lumbar MRI were not determined to be significant to his clinical presentation by neurosurgery. Pt reports that the ataxia symptoms have now resolved however he has been out of work since 06/13/22 due to symptoms. He was referred to OP PT from the hospital for dizziness. Pt had a follow-up appointment with his PCP and at that time he also reported a cough x 1.5 weeks with dyspnea which was attributed to a COPD flair. However at the time of PT evaluation he reports significant improvement in those symptoms. His past medical history is significant for hypertension, hyperlipidemia, diabetes mellitus, asthma, GERD, depression with anxiety, prostate cancer (s/p cryotherapy), bladder cancer, obesity with  BMI 34.72, and subdural hematoma in March due to fall.  Provocative Factors: Getting up out of bed Easing Factors: waiting for symptoms to resolved  Auditory complaints (tinnitus, pain, drainage, hearing loss, aural fullness): Yes, occasional pain in L ear as well as "tightness" in L ear. 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 He wears reading glasses Chest pain/palpitations: No History of head injury/concussion: Yes, fall with head injury and subdural hematoma Stress/anxiety: Yes, history of anxiety Headaches/migraines: None  Has patient fallen in last 6 months? Yes, Number of falls: 1 Pertinent pain: No Dominant hand: left Imaging: Yes, see history Prior level of function: Independent Hobbies: Pt denies any hobbies  Red Flags: (dysarthria, dysphagia, drop attacks, chills, fevers, night sweats, bowel  and bladder changes, recent weight loss/gain) Review of systems negative for red flags with the exception of occasional difficulty swallowing. Pt reports that these symptoms are chronic and not new  PRECAUTIONS: None  WEIGHT BEARING RESTRICTIONS No  LIVING ENVIRONMENT: Lives with: lives with their spouse Lives in: House/apartment   PATIENT GOALS: Decreased dizziness symptoms   OBJECTIVE  EXAMINATION  POSTURE: No gross deficits contributing to symptoms  NEUROLOGICAL SCREEN: (2+ unless otherwise noted.) N=normal  Ab=abnormal  Level Dermatome R L Myotome R L Reflex R L  C3 Anterior Neck N N Sidebend C2-3 N N Jaw CN V    C4 Top of Shoulder N N Shoulder Shrug C4 N N Hoffman's UMN    C5 Lateral Upper Arm N N Shoulder ABD C4-5 N N Biceps C5-6    C6 Lateral Arm/ Thumb N N Arm Flex/ Wrist Ext C5-6 N N Brachiorad. C5-6    C7 Middle Finger N N Arm Ext//Wrist Flex C6-7 N N Triceps C7    C8 4th & 5th Finger N N Flex/ Ext Carpi Ulnaris C8 N N Patellar (L3-4)    T1 Medial Arm N N Interossei T1 N N Gastrocnemius    L2 Medial thigh/groin N N Illiopsoas (L2-3) N N     L3 Lower thigh/med.knee N N Quadriceps (  L3-4) N N     L4 Medial leg/lat thigh N N Tibialis Ant (L4-5) N N     L5 Lat. leg & dorsal foot N N EHL (L5) N N     S1 post/lat foot/thigh/leg N N Gastrocnemius (S1-2) N N     S2 Post./med. thigh & leg N N Hamstrings (L4-S3) N N       CRANIAL NERVES II, III, IV, VI: Pupils equal and reactive to light, visual acuity and visual fields are intact, extraocular muscles are intact  V: Facial sensation is intact and symmetric bilaterally  VII: Facial strength is intact and symmetric bilaterally  VIII: Hearing is normal as tested by gross conversation IX, X: Palate elevates midline, normal phonation, uvula midline XI: Shoulder shrug strength is intact  XII: Tongue protrudes midline    SOMATOSENSORY Grossly intact to light touch bilateral UEs/LEs as determined by testing dermatomes C2-T2  and L2-S2. Proprioception and hot/cold testing deferred on this date.   COORDINATION Finger to Nose: Normal Heel to Shin: Normal Pronator Drift: Negative Rapid Alternating Movements: Normal Finger to Thumb Opposition: Normal    RANGE OF MOTION Cervical Spine AROM is limited in all directions but painless. No focal deficits in AROM noted in Natural Steps strength WNL without focal deficits   TRANSFERS/GAIT Independent for transfers and ambulation without assistive device    PATIENT SURVEYS FOTO: 52, predicted improvement to 58  DHI: 22/100 ABC: 80.6% (06/29/22)   POSTURAL CONTROL TESTS  Clinical Test of Sensory Interaction for Balance (CTSIB): Deferred   OCULOMOTOR / VESTIBULAR TESTING  Oculomotor Exam- Room Light: Deferred  Oculomotor Exam- Fixation Suppressed: Deferred  BPPV TESTS:  Symptoms Duration Intensity Nystagmus  L Dix-Hallpike None   A few downbeating L torsional beats  R Dix-Hallpike Dizziness 10-15s Mild Upbeating R torsional nystagmus  L Head Roll None   None  R Head Roll None   None  L Sidelying Test      R Sidelying Test      (blank = not tested)   FUNCTIONAL OUTCOME MEASURES   Results Comments  BERG    DGI    FGA    TUG    5TSTS    6 Minute Walk Test    10 Meter Gait Speed    (blank = not tested)   TODAY'S TREATMENT   Update outcome measures and consider discharge (FOTO, DHI, FGA, % improvement)  SUBJECTIVE: Pt reports that he is doing well today. He reports approximately 75-80% improvement in his symptoms since starting with therapy. He denies any further episodes of vertigo since the last therapy session. He no longer has to sleep on a wedge to avoid triggering his symptoms. No significant pain reported upon arrival. He returns to see Dr. Parks Ranger tomorrow and feels like he is ready to return to work. No specific concerns upon arrival   Neuromuscular Re-education  Updated outcome measures with  patient: DHI: 10/100 FOTO: 62 % improvement: 75-80% FGA: 23/30 BERG: 54/56  BPPV TESTS:  Symptoms Duration Intensity Nystagmus  L Dix-Hallpike None   None  R Dix-Hallpike None   None  L Head Roll None   None  R Head Roll None   None  L Sidelying Test None   None  R Sidelying Test None   None  (blank = not tested)   PATIENT EDUCATION:  Education details: Discharge Person educated: Patient Education method: Explanation Education comprehension: verbalized understanding   HOME EXERCISE PROGRAM:  None  ASSESSMENT:  CLINICAL IMPRESSION: Pt reports at least 75-80% improvement in his dizziness symptoms since starting with therapy. Dix-Hallpike, Roll, and Sidelying Tests are all negative bilaterally today for vertigo and nystagmus. Pt completed FOTO survey and scored 62 which is improved from his score of 52 at the initial evaluation and above the predicted target. His [place] dropped from 22/100 initially to 10/100 today also indicating significant improvement in his symptoms. Additional balance testing performed today and pt demonstrates good static balance scoring 54/56 on there BERG. He does have some dynamic balance impairments scoring 23/30 on the FGA but he is not interested in additional balance therapy at this time. Pending clearance from his MD pt should be safe to return to work. He reports feeling ready to return to work at this time. Pt will be discharged at this time.  REHAB POTENTIAL: Good  CLINICAL DECISION MAKING: Unstable/unpredictable  EVALUATION COMPLEXITY: High   GOALS: Goals reviewed with patient? No  SHORT TERM GOALS: Target date: 07/20/2022   Pt will be independent with HEP for dizziness in order to decrease symptoms, improve balance,decrease fall risk, and improve function at home. Baseline: Goal status: ACHIEVED   LONG TERM GOALS: Target date: 08/17/2022   Pt will increase FOTO to at least 58 to demonstrate significant improvement in function at home  related to dizziness.  Baseline: 06/22/22: 52; 07/04/22: 62; 07/13/22: 62 Goal status: ACHIEVED  2.  Pt will decrease DHI score by at least 18 points in order to demonstrate clinically significant reduction in disability related to dizziness.  Baseline: 06/22/22: 22/100; 07/04/22: Deferred; 07/13/22: 10/100; Goal status: PARTIALLY MET  3. Pt will improve FGA by at least 4 points in order to demonstrate clinically significant improvement in balance and decreased risk for falls.     Baseline: 06/22/22: To be completed; 07/04/22: Deferred; 07/13/22: 23/30 Goal status: REVISED  4. Pt will report at least 75% improvement in his symptoms in order to return to full function at home and work with less symptoms. Baseline: 07/04/22: >70% improvement, 07/13/22: 75-80% improved Goal status: ACHIEVED   PLAN:  PT FREQUENCY: 2x/week  PT DURATION: 8 weeks  PLANNED INTERVENTIONS: Therapeutic exercises, Therapeutic activity, Neuromuscular re-education, Balance training, Gait training, Patient/Family education, Joint manipulation, Joint mobilization, Canalith repositioning, Aquatic Therapy, Dry Needling, Cognitive remediation, Electrical stimulation, Spinal manipulation, Spinal mobilization, Cryotherapy, Moist heat, Traction, Ultrasound, Ionotophoresis 63m/ml Dexamethasone, and Manual therapy  PLAN FOR NEXT SESSION: Discharge  JLyndel SafeHuprich PT, DPT, GCS  Marquia Costello 07/13/2022, 12:10 PM

## 2022-07-13 ENCOUNTER — Ambulatory Visit: Payer: BC Managed Care – PPO

## 2022-07-13 DIAGNOSIS — R42 Dizziness and giddiness: Secondary | ICD-10-CM | POA: Diagnosis not present

## 2022-07-14 ENCOUNTER — Encounter: Payer: Self-pay | Admitting: Family Medicine

## 2022-07-14 ENCOUNTER — Ambulatory Visit: Payer: BC Managed Care – PPO | Admitting: Family Medicine

## 2022-07-14 VITALS — BP 139/89 | HR 78 | Ht 70.0 in | Wt 232.6 lb

## 2022-07-14 DIAGNOSIS — F418 Other specified anxiety disorders: Secondary | ICD-10-CM

## 2022-07-14 DIAGNOSIS — J432 Centrilobular emphysema: Secondary | ICD-10-CM

## 2022-07-14 DIAGNOSIS — R42 Dizziness and giddiness: Secondary | ICD-10-CM

## 2022-07-14 DIAGNOSIS — J9801 Acute bronchospasm: Secondary | ICD-10-CM

## 2022-07-14 DIAGNOSIS — R531 Weakness: Secondary | ICD-10-CM | POA: Diagnosis not present

## 2022-07-14 DIAGNOSIS — I10 Essential (primary) hypertension: Secondary | ICD-10-CM

## 2022-07-14 DIAGNOSIS — R058 Other specified cough: Secondary | ICD-10-CM

## 2022-07-14 DIAGNOSIS — K219 Gastro-esophageal reflux disease without esophagitis: Secondary | ICD-10-CM

## 2022-07-14 DIAGNOSIS — E1142 Type 2 diabetes mellitus with diabetic polyneuropathy: Secondary | ICD-10-CM

## 2022-07-14 DIAGNOSIS — R2689 Other abnormalities of gait and mobility: Secondary | ICD-10-CM

## 2022-07-14 LAB — POCT GLYCOSYLATED HEMOGLOBIN (HGB A1C): Hemoglobin A1C: 9.3 % — AB (ref 4.0–5.6)

## 2022-07-14 MED ORDER — HYDROCODONE BIT-HOMATROP MBR 5-1.5 MG/5ML PO SOLN: 5.0000 mL | Freq: Three times a day (TID) | ORAL | 0 refills | Status: DC | PRN

## 2022-07-14 MED ORDER — BENZONATATE 200 MG PO CAPS: 200.0000 mg | ORAL_CAPSULE | Freq: Three times a day (TID) | ORAL | 0 refills | Status: DC | PRN

## 2022-07-14 MED ORDER — OMEPRAZOLE 40 MG PO CPDR: 40.0000 mg | DELAYED_RELEASE_CAPSULE | Freq: Every day | ORAL | 3 refills | Status: DC

## 2022-07-14 MED ORDER — IMIPRAMINE HCL 10 MG PO TABS: 10.0000 mg | ORAL_TABLET | Freq: Every day | ORAL | 3 refills | Status: DC

## 2022-07-14 NOTE — Progress Notes (Signed)
Subjective:    Patient ID: Henry Horne, male    DOB: 12/11/1950, 71 y.o.   MRN: [id number]  Henry Horne is a 71 y.o. male presenting on 07/14/2022 for Diabetes   HPI  Centrilobular Emphysema Recurrent Cough, bronchospasm Ataxia Dizziness - Resolved  Follow-up today for return to work eval. Last visit 06/16/22. He has remained on short term disability out of work. He has continued Physical Therapy with significant improvement, according to last document 07/13/22 he was determined to be improved by 75-80% and ready to return to work based on his physical performance.  Dizziness significantly improved with therapy. His mobility has improved  He will need a Letter to return to work tomorrow 07/15/22 regular duty. Also has paperwork with disability claim to return to work without restrictions  Coughing, persistent Reports 20 times a day with turning blue and choking with cough, waking him up, cannot eat while coughing. Difficulty breathing when coughing. Has taken Mucinex without relief. Other OTC meds Does not endorse significant wheezing or shortness of breath but has bad coughing spells that choke him.  For COPD he continues on inhaler therapy Breztri and Albuterol PRN  [place] history Neurosurgery consult with MRI cervical  thoracic spine, consulted with Dr Grover Canavan. He did not have back pain. They referred for "back pain" but he did not have back pain.     He has been out of work since 06/13/22, and he is unable to return at this time due to weakness, ataxia, and dyspnea with COPD flare      07/14/2022    9:54 AM 06/16/2022   11:39 AM 04/08/2022   10:36 AM  Depression screen PHQ 2/9  Decreased Interest 1 1 0  Down, Depressed, Hopeless '1 1 1  '$ PHQ - 2 Score '2 2 1  '$ Altered sleeping 0 0 0  Tired, decreased energy '1 1 1  '$ Change in appetite 1 1 0  Feeling bad or failure about yourself  0 0 0  Trouble concentrating 0 0 0  Moving slowly or fidgety/restless 1 1 0   Suicidal thoughts 0 0 0  PHQ-9 Score '5 5 2  '$ Difficult doing work/chores Somewhat difficult Somewhat difficult Not difficult at all    Social History   Tobacco Use   Smoking status: Former    Years: 30.00    Types: Cigarettes    Quit date: 05/18/2004    Years since quitting: 18.1   Smokeless tobacco: Former  Scientific laboratory technician Use: Never used  Substance Use Topics   Alcohol use: No    Alcohol/week: 0.0 standard drinks of alcohol   Drug use: No    Review of Systems Per HPI unless specifically indicated above     Objective:    BP 139/89   Pulse 78   Ht '5\' 10"'$  (1.778 m)   Wt 232 lb 9.6 oz (105.5 kg)   SpO2 96%   BMI 33.37 kg/m   Wt Readings from Last 3 Encounters:  07/14/22 232 lb 9.6 oz (105.5 kg)  06/16/22 235 lb 12.8 oz (107 kg)  06/12/22 242 lb (109.8 kg)    Physical Exam Vitals and nursing note reviewed.  Constitutional:      General: He is not in acute distress.    Appearance: He is well-developed. He is obese. He is not diaphoretic.     Comments: Well-appearing, comfortable, cooperative  HENT:     Head: Normocephalic and atraumatic.  Eyes:     General:  Right eye: No discharge.        Left eye: No discharge.     Conjunctiva/sclera: Conjunctivae normal.  Neck:     Thyroid: No thyromegaly.  Cardiovascular:     Rate and Rhythm: Normal rate and regular rhythm.     Pulses: Normal pulses.     Heart sounds: Normal heart sounds. No murmur heard. Pulmonary:     Effort: Pulmonary effort is normal. No respiratory distress.     Breath sounds: No wheezing or rales.     Comments: Coughing spell rare but severe. No wheezing otherwise. Speaks full sentences. Musculoskeletal:        General: Normal range of motion.     Cervical back: Normal range of motion and neck supple.     Comments: Gait has improved. Stand from seated, ambulate without assistance. Improved speed of walking and gait  Muscle strength has returned to normal 5 out of 5  Lymphadenopathy:      Cervical: No cervical adenopathy.  Skin:    General: Skin is warm and dry.     Findings: No erythema or rash.  Neurological:     Mental Status: He is alert and oriented to person, place, and time. Mental status is at baseline.     Sensory: No sensory deficit.     Motor: No weakness.     Gait: Gait normal.  Psychiatric:        Behavior: Behavior normal.     Comments: Well groomed, good eye contact, normal speech and thoughts    Results for orders placed or performed in visit on 07/14/22  POCT HgB A1C  Result Value Ref Range   Hemoglobin A1C 9.3 (A) 4.0 - 5.6 %      Assessment & Plan:   Problem List Items Addressed This Visit     Centrilobular emphysema ([place])   Relevant Medications   benzonatate (TESSALON) 200 MG capsule   HYDROcodone bit-homatropine (HYCODAN) 5-1.5 MG/5ML syrup   Other Relevant Orders   Ambulatory referral to Pulmonology   Depression with anxiety   Relevant Medications   imipramine (TOFRANIL) 10 MG tablet   Essential hypertension   Type 2 diabetes mellitus with peripheral neuropathy ([facility])   Relevant Medications   imipramine (TOFRANIL) 10 MG tablet   Other Relevant Orders   POCT HgB A1C (Completed)   Other Visit Diagnoses     Generalized weakness    -  Primary   Recurrent non-productive cough       Relevant Medications   omeprazole (PRILOSEC) 40 MG capsule   benzonatate (TESSALON) 200 MG capsule   HYDROcodone bit-homatropine (HYCODAN) 5-1.5 MG/5ML syrup   Other Relevant Orders   Ambulatory referral to Pulmonology   Bronchospasm       Relevant Medications   benzonatate (TESSALON) 200 MG capsule   HYDROcodone bit-homatropine (HYCODAN) 5-1.5 MG/5ML syrup   Other Relevant Orders   Ambulatory referral to Pulmonology   Postural dizziness       Balance disorder       Gastroesophageal reflux disease, unspecified whether esophagitis present       Relevant Medications   omeprazole (PRILOSEC) 40 MG capsule      He is cleared to return to work  full regular duty starting tomorrow Friday 07/15/22. Letter written, given to patient Form completed for return to work fax today  Type 2 DM Elevated A1c to 9.3 Likely attributed to illness, prednisone courses Continues on regimen with Mounjaro, Lantus, Metformin, we will follow up on future. May  warrant dose adjust on Mounjaro in future if he can tolerate this.  Generalized weakness - resolved.  Postural Dizziness / Balance Disorder / Ataxia - Resolved. Completed PT Cleared to return to work.   COPD emphysema with flare up recently and has had severe lingering cough now with resolved COPD flare, but has almost behavioral severe coughing with upper airway symptoms predominantly. Cough interrupting eating, breathing, sleeping and function.   Continue Breztri, Albuterol  Will order Tessalon Perls 200 TID PRN, Hycodan cough syrup at night caution sedation.  Adjust his PPI Omeprazole from 20 to '40mg'$  daily, as other possible etiology of this cough.  Will refer to [facility] for further assessment of this refractory / lingering cough  Refill Imipramine.  Orders Placed This Encounter  Procedures   Ambulatory referral to Pulmonology    Referral Priority:   Routine    Referral Type:   Consultation    Referral Reason:   Specialty Services Required    Requested Specialty:   Pulmonary Disease    Number of Visits Requested:   1   POCT HgB A1C     Meds ordered this encounter  Medications   omeprazole (PRILOSEC) 40 MG capsule    Sig: Take 1 capsule (40 mg total) by mouth daily.    Dispense:  30 capsule    Refill:  3   benzonatate (TESSALON) 200 MG capsule    Sig: Take 1 capsule (200 mg total) by mouth 3 (three) times daily as needed for cough.    Dispense:  30 capsule    Refill:  0   HYDROcodone bit-homatropine (HYCODAN) 5-1.5 MG/5ML syrup    Sig: Take 5 mLs by mouth every 8 (eight) hours as needed for cough.    Dispense:  120 mL    Refill:  0   imipramine (TOFRANIL) 10  MG tablet    Sig: Take 1 tablet (10 mg total) by mouth at bedtime.    Dispense:  90 tablet    Refill:  3     Follow up plan: Return if symptoms worsen or fail to improve.   [name], [facility] 07/14/2022, 9:35 AM

## 2022-07-14 NOTE — Patient Instructions (Addendum)
Thank you for coming to the office today.  For cough  Try higher dose OMeprazole from 20 to '40mg'$  daily  Try Tessalon Perls '200mg'$  2-3 times a day  Try Cough Syrup Hycodan, caution sedation  Refilled IMipramine  Referral  [facility] [address] [place], [place] Phone: [phone]  Please schedule a Follow-up Appointment to: Return if symptoms worsen or fail to improve.  If you have any other questions or concerns, please feel free to call the office or send a message through [facility]. You may also schedule an earlier appointment if necessary.  Additionally, you may be receiving a survey about your experience at our office within a few days to 1 week by e-mail or mail. We value your feedback.  [name], DO [place]

## 2022-07-21 ENCOUNTER — Ambulatory Visit (INDEPENDENT_AMBULATORY_CARE_PROVIDER_SITE_OTHER): Payer: BC Managed Care – PPO | Admitting: Student in an Organized Health Care Education/Training Program

## 2022-07-21 ENCOUNTER — Encounter: Payer: Self-pay | Admitting: Student in an Organized Health Care Education/Training Program

## 2022-07-21 VITALS — BP 136/80 | HR 95 | Temp 97.9°F | Ht 70.5 in | Wt 234.0 lb

## 2022-07-21 DIAGNOSIS — R052 Subacute cough: Secondary | ICD-10-CM | POA: Diagnosis not present

## 2022-07-21 DIAGNOSIS — E041 Nontoxic single thyroid nodule: Secondary | ICD-10-CM | POA: Diagnosis not present

## 2022-07-21 DIAGNOSIS — J432 Centrilobular emphysema: Secondary | ICD-10-CM

## 2022-07-21 MED ORDER — LORATADINE 10 MG PO TABS: 10.0000 mg | ORAL_TABLET | Freq: Every day | ORAL | 11 refills | Status: DC

## 2022-07-21 NOTE — Progress Notes (Signed)
Synopsis: Referred in for cough by [name] *  Assessment & Plan:   #Subacute cough #COPD #History of Empyema  Presenting with cough with associated nasal congestion, rhinorrhea, and a history of COPD. His lung exam is non-revealing and he does not exhibit any wheezing, rales or rhonchi. He is not on an ACEi (history of ACEi associated cough) but is on an ARB.  Given his past medical history of an empyema (explaining the L blunted costophrenic angle), as well as history of bladder cancer, I will proceed with ordering a CT scan of the chest to evaluate for an overt parenchymal or endobronchial masses. I will also obtain a pulmonary function test (spirometry, lung volumes, DLCO) to evaluate for restriction and obstruction given history of COPD. I will treat his allergic rhinitis/upper airway cough syndrome (does have trace mucosal thickening of the R. Maxillary sinus on head CT with a mucous retention cyst that is stable) with a second generation anti-histamine as well as continuing his daily Flonase. He will continue Breztri two puffs twice daily for COPD management. Furthermore, he tells me he has a history of GERD and I did counsel him on a GERD consistent diet. If these tests do not return with an answer, I will consider bronchoscopy for a fiberoptic evaluation of the vocal cords and airways to rule out any endobronchial lesions. Finally, should the workup not reveal a cause, I will consider switching his anti-HTN medicine from an ARB to another class such as a calcium channel blocker.  - CT CHEST WO CONTRAST; Future - Pulmonary Function Test [facility] Only; Future - loratadine (CLARITIN) 10 MG tablet; Take 1 tablet (10 mg total) by mouth daily.  Dispense: 30 tablet; Refill: 11   Return in about 4 weeks (around 08/18/2022).  I spent 60 minutes caring for this patient today, including preparing to see the patient, obtaining and/or reviewing separately obtained history, performing a  medically appropriate examination and/or evaluation, counseling and educating the patient/family/caregiver, ordering medications, tests, or procedures, and documenting clinical information in the electronic health record  [name], MD [facility] 07/21/2022 2:49 PM    End of visit medications:  Meds ordered this encounter  Medications   loratadine (CLARITIN) 10 MG tablet    Sig: Take 1 tablet (10 mg total) by mouth daily.    Dispense:  30 tablet    Refill:  11     Current Outpatient Medications:    albuterol (VENTOLIN HFA) 108 (90 Base) MCG/ACT inhaler, INHALE 1 TO 2 PUFFS BY MOUTH EVERY 4 HOURS AS NEEDED FOR WHEEZING OR SHORTNESS OF BREATH, Disp: 9 g, Rfl: 0   amLODipine (NORVASC) 5 MG tablet, Take 1 tablet by mouth once daily, Disp: 90 tablet, Rfl: 0   benzonatate (TESSALON) 200 MG capsule, Take 1 capsule (200 mg total) by mouth 3 (three) times daily as needed for cough., Disp: 30 capsule, Rfl: 0   BREZTRI AEROSPHERE 160-9-4.8 MCG/ACT AERO, Inhale 2 puffs into the lungs 2 (two) times daily., Disp: 10.7 g, Rfl: 11   diclofenac sodium (VOLTAREN) 1 % GEL, Apply 2 g topically 3 (three) times daily as needed. For knees, Disp: 100 g, Rfl: 2   FLUoxetine (PROZAC) 40 MG capsule, Take 1 capsule by mouth once daily, Disp: 90 capsule, Rfl: 1   fluticasone (FLONASE) 50 MCG/ACT nasal spray, INSTILL 2 SPRAYS INTO BOTH NOSTRIL DAILY. USE FOR 4 TO 6 WEEKS THEN STOP AND USE SEASONALLY OR AS NEEDED., Disp: 16 g, Rfl: 3  Garlic Oil 2 MG CAPS, Take 1,000 mg by mouth 2 (two) times daily. Reported on 11/17/2015, Disp: , Rfl:    HYDROcodone bit-homatropine (HYCODAN) 5-1.5 MG/5ML syrup, Take 5 mLs by mouth every 8 (eight) hours as needed for cough., Disp: 120 mL, Rfl: 0   imipramine (TOFRANIL) 10 MG tablet, Take 10 mg by mouth at bedtime., Disp: , Rfl:    insulin glargine (LANTUS) 100 UNIT/ML injection, INJECT 70 UNITS SUBCUTANEOUSLY ONCE DAILY, Disp: 60 mL, Rfl: 0   loratadine  (CLARITIN) 10 MG tablet, Take 1 tablet (10 mg total) by mouth daily., Disp: 30 tablet, Rfl: 11   losartan (COZAAR) 50 MG tablet, Take 1 tablet (50 mg total) by mouth daily., Disp: 90 tablet, Rfl: 3   metFORMIN (GLUCOPHAGE) 1000 MG tablet, TAKE 1 TABLET BY MOUTH TWICE DAILY WITH  MEALS, Disp: 180 tablet, Rfl: 0   MOUNJARO 5 MG/0.5ML Pen, INJECT 5 MG  SUBCUTANEOUSLY ONCE A WEEK, Disp: 6 mL, Rfl: 0   omeprazole (PRILOSEC) 40 MG capsule, Take 1 capsule (40 mg total) by mouth daily., Disp: 30 capsule, Rfl: 3   Subjective:   PATIENT ID: Henry Horne GENDER: male DOB: 1951-05-09, MRN: [id number]  Chief Complaint  Patient presents with   pulmonary consult    Hx of emphysema- dry cough at times prod with clear sputum and wheezing x32mo     HPI  Henry Horne a pleasant 71year old male patient presenting to clinic for the evaluation of cough.  He presents today with his wife.  Patient reports that he has had a cough for a few months now and its been incessant to the point where he feels he cannot catch his breath. He has bouts of cough that leave him out of breath and blue in color. The cough is nonproductive, sporadic in occurrence, and not associated with positional changes.  He does not feel that the cough is worse when he is laying flat or sitting up.  The cough occurs throughout the day as well as at night, waking him up from sleep.  He denies any associated sputum production or hemoptysis.  He denies any chest pain or chest tightness and also denies any wheezing.  He was seen by his primary care physician and treated for a COPD exacerbation in July with prednisone and antibiotics.  His inhaler regimen was adjusted and he is currently on triple therapy for his COPD.  He reports a runny nose (I feel like it is a fountain), a problem that he has dealt with for a long period of time. He had previously taken an allergy pill for it with some improvement.  He is currently using Flonase sporadically  and does not feel that its of much help.  His wife reports that she thought he was coughing with food but that is not currently the case and he does not have a cough associated with swallowing.  He reports a 10 pound weight loss that he says is intentional (staying away from sweets).  He does not have any GI or GU changes and denies any rashes, fevers, or chills.  He reports night sweats but him and his wife tell me that this is a chronic thing and not something that has recently developed.  His past medical history is significant for a left-sided empyema for which he [facility] 2 chest tubes [facility]  He has also had history of bladder cancer as well as prostate cancer.  He is  a former smoker (quit in 2005, 30 pack years).  He works at [employer].  Ancillary information including prior medications, full medical/surgical/family/social histories, and PFTs (when available) are listed below and have been reviewed.   Review of Systems  Constitutional:  Positive for weight loss (10 lbs intentional). Negative for chills and fever.  HENT:  Positive for congestion. Negative for sinus pain.   Respiratory:  Positive for cough. Negative for hemoptysis, sputum production and wheezing.   Cardiovascular:  Negative for chest pain and palpitations.     Objective:   Vitals:   07/21/22 1406  BP: 136/80  Pulse: 95  Temp: 97.9 F (36.6 C)  TempSrc: Temporal  SpO2: 95%  Weight: 234 lb (106.1 kg)  Height: 5' 10.5" (1.791 m)   95% on RA BMI Readings from Last 3 Encounters:  07/21/22 33.10 kg/m  07/14/22 33.37 kg/m  06/16/22 33.83 kg/m   Wt Readings from Last 3 Encounters:  07/21/22 234 lb (106.1 kg)  07/14/22 232 lb 9.6 oz (105.5 kg)  06/16/22 235 lb 12.8 oz (107 kg)    Physical Exam Constitutional:      Appearance: He is obese.  HENT:     Head: Normocephalic.     Nose: Congestion and rhinorrhea present.     Mouth/Throat:     Mouth: Mucous membranes are  moist.  Eyes:     Pupils: Pupils are equal, round, and reactive to light.  Cardiovascular:     Rate and Rhythm: Normal rate and regular rhythm.     Pulses: Normal pulses.     Heart sounds: Normal heart sounds.  Pulmonary:     Effort: Pulmonary effort is normal.     Comments: Distant breath sounds bilaterally. Decreased breath sounds over the left lower lung base Musculoskeletal:     Cervical back: Normal range of motion.     Right lower leg: No edema.     Left lower leg: No edema.  Neurological:     General: No focal deficit present.     Mental Status: He is alert and oriented to person, place, and time. Mental status is at baseline.       Ancillary Information    Past Medical History:  Diagnosis Date   Anxiety    Cancer of kidney ([place]) 11/28/2008   cystoscopy 2010, 2014, 2015, 2016- MD q 6 months   Depression    Emphysema of lung ([place]) 11/28/1994   Hyperlipidemia    Hypertension    Joint pain    Prostate cancer ([place]) 11/28/2006   removed with cryotherapy   SDH (subdural hematoma) ([place])      Family History  Problem Relation Age of Onset   Diabetes Mother    Heart disease Father    Cancer Father    Hyperlipidemia Father    Hypertension Father    Diabetes Brother    COPD Brother    Autoimmune disease Brother    Heart disease Brother    Diabetes Brother      Past Surgical History:  Procedure Laterality Date   DIGIT NAIL REMOVAL Left 2016   left and right foot -toe nails    Social History   Socioeconomic History   Marital status: Married    Spouse name: Butch Penny   Number of children: Not on file   Years of education: Not on file   Highest education level: Not on file  Occupational History   Not on file  Tobacco Use   Smoking status: Former  Packs/day: 1.00    Years: 30.00    Total pack years: 30.00    Types: Cigarettes    Quit date: 05/18/2004    Years since quitting: 18.1   Smokeless tobacco: Former  Scientific laboratory technician Use: Never used   Substance and Sexual Activity   Alcohol use: No    Alcohol/week: 0.0 standard drinks of alcohol   Drug use: No   Sexual activity: Not on file  Other Topics Concern   Not on file  Social History Narrative   Not on file   Social Determinants of Health   Financial Resource Strain: Not on file  Food Insecurity: Not on file  Transportation Needs: Not on file  Physical Activity: Not on file  Stress: Not on file  Social Connections: Not on file  Intimate Partner Violence: Not on file     Allergies  Allergen Reactions   Zetia [Ezetimibe] Other (See Comments)    Joint pain, congestion, muscle ache Joint pain, congestion, muscle ache   Ace Inhibitors Cough   Pneumococcal Vaccine    Pneumococcal Vaccines      CBC    Component Value Date/Time   WBC 12.8 (H) 06/12/2022 2139   RBC 5.14 06/12/2022 2139   HGB 13.2 06/12/2022 2139   HCT 41.6 06/12/2022 2139   PLT 241 06/12/2022 2139   MCV 80.9 06/12/2022 2139   MCH 25.7 (L) 06/12/2022 2139   MCHC 31.7 06/12/2022 2139   RDW 13.3 06/12/2022 2139   LYMPHSABS 1,494 03/31/2022 0800   MONOABS 740 12/30/2016 0001   EOSABS 268 03/31/2022 0800   BASOSABS 80 03/31/2022 0800    Pulmonary Functions Testing Results:     No data to display          Outpatient Medications Prior to Visit  Medication Sig Dispense Refill   albuterol (VENTOLIN HFA) 108 (90 Base) MCG/ACT inhaler INHALE 1 TO 2 PUFFS BY MOUTH EVERY 4 HOURS AS NEEDED FOR WHEEZING OR SHORTNESS OF BREATH 9 g 0   amLODipine (NORVASC) 5 MG tablet Take 1 tablet by mouth once daily 90 tablet 0   benzonatate (TESSALON) 200 MG capsule Take 1 capsule (200 mg total) by mouth 3 (three) times daily as needed for cough. 30 capsule 0   BREZTRI AEROSPHERE 160-9-4.8 MCG/ACT AERO Inhale 2 puffs into the lungs 2 (two) times daily. 10.7 g 11   diclofenac sodium (VOLTAREN) 1 % GEL Apply 2 g topically 3 (three) times daily as needed. For knees 100 g 2   FLUoxetine (PROZAC) 40 MG capsule Take 1  capsule by mouth once daily 90 capsule 1   fluticasone (FLONASE) 50 MCG/ACT nasal spray INSTILL 2 SPRAYS INTO BOTH NOSTRIL DAILY. USE FOR 4 TO 6 WEEKS THEN STOP AND USE SEASONALLY OR AS NEEDED. 16 g 3   Garlic Oil 2 MG CAPS Take 1,000 mg by mouth 2 (two) times daily. Reported on 11/17/2015     HYDROcodone bit-homatropine (HYCODAN) 5-1.5 MG/5ML syrup Take 5 mLs by mouth every 8 (eight) hours as needed for cough. 120 mL 0   imipramine (TOFRANIL) 10 MG tablet Take 10 mg by mouth at bedtime.     insulin glargine (LANTUS) 100 UNIT/ML injection INJECT 70 UNITS SUBCUTANEOUSLY ONCE DAILY 60 mL 0   losartan (COZAAR) 50 MG tablet Take 1 tablet (50 mg total) by mouth daily. 90 tablet 3   metFORMIN (GLUCOPHAGE) 1000 MG tablet TAKE 1 TABLET BY MOUTH TWICE DAILY WITH  MEALS 180 tablet 0  MOUNJARO 5 MG/0.5ML Pen INJECT 5 MG  SUBCUTANEOUSLY ONCE A WEEK 6 mL 0   omeprazole (PRILOSEC) 40 MG capsule Take 1 capsule (40 mg total) by mouth daily. 30 capsule 3   imipramine (TOFRANIL) 10 MG tablet Take 1 tablet (10 mg total) by mouth at bedtime. 90 tablet 3   No facility-administered medications prior to visit.

## 2022-07-21 NOTE — Patient Instructions (Addendum)
Today, we discussed your cough and steps we will take to work it up.  I have ordered a CT scan of your chest as well as a pulmonary function test to evaluate your lung function. I have also prescribed an allergy pill to be taken daily. Please continue using your Breztri and Flonase as prescribed, as well as the as needed albuterol

## 2022-07-28 ENCOUNTER — Ambulatory Visit
Admission: RE | Admit: 2022-07-28 | Discharge: 2022-07-28 | Disposition: A | Discharge status: Home / Self Care | Payer: BC Managed Care – PPO | Source: Ambulatory Visit | Attending: Student in an Organized Health Care Education/Training Program | Admitting: Student in an Organized Health Care Education/Training Program

## 2022-07-28 DIAGNOSIS — R052 Subacute cough: Secondary | ICD-10-CM | POA: Diagnosis not present

## 2022-07-28 DIAGNOSIS — J984 Other disorders of lung: Secondary | ICD-10-CM | POA: Diagnosis not present

## 2022-08-03 ENCOUNTER — Other Ambulatory Visit: Payer: Self-pay | Admitting: Family Medicine

## 2022-08-04 NOTE — Addendum Note (Signed)
Addended byArmando Reichert on: 08/04/2022 10:56 AM   Modules accepted: Orders

## 2022-08-04 NOTE — Telephone Encounter (Signed)
Requested Prescriptions  Pending Prescriptions Disp Refills  . insulin glargine (LANTUS) 100 UNIT/ML injection [Pharmacy Med Name: Lantus 100 UNIT/ML Subcutaneous Solution] 60 mL 0    Sig: INJECT [place] DAILY     Endocrinology:  Diabetes - Insulins Failed - 08/03/2022 11:57 AM      Failed - HBA1C is between 0 and 7.9 and within 180 days    Hemoglobin A1C  Date Value Ref Range Status  07/14/2022 9.3 (A) 4.0 - 5.6 % Final   Hgb A1c MFr Bld  Date Value Ref Range Status  03/31/2022 8.9 (H) <5.7 % of total Hgb Final    Comment:    For someone without known diabetes, a hemoglobin A1c value of 6.5% or greater indicates that they may have  diabetes and this should be confirmed with a follow-up  test. . For someone with known diabetes, a value <7% indicates  that their diabetes is well controlled and a value  greater than or equal to 7% indicates suboptimal  control. A1c targets should be individualized based on  duration of diabetes, age, comorbid conditions, and  other considerations. . Currently, no consensus exists regarding use of hemoglobin A1c for diagnosis of diabetes for children. Renella Cunas - Valid encounter within last 6 months    Recent Outpatient Visits          3 weeks ago Generalized weakness   [place], DO   1 month ago Acute bronchitis with COPD [facility])   [place], DO   3 months ago Annual physical exam   [place], DO   5 months ago Essential hypertension   [place], DO   6 months ago Type 2 diabetes mellitus with peripheral neuropathy [facility])   [facility] Haus[facility], DO      Future Appointments            In 3 weeks [name], MD [place]

## 2022-08-05 ENCOUNTER — Other Ambulatory Visit: Payer: Self-pay | Admitting: Family Medicine

## 2022-08-08 NOTE — Telephone Encounter (Signed)
Requested Prescriptions  Pending Prescriptions Disp Refills  . metFORMIN (GLUCOPHAGE) 1000 MG tablet [Pharmacy Med Name: metFORMIN HCl 1000 MG Oral Tablet] 180 tablet 1    Sig: TAKE 1 TABLET BY MOUTH TWICE DAILY WITH MEALS     Endocrinology:  Diabetes - Biguanides Failed - 08/05/2022  9:55 AM      Failed - HBA1C is between 0 and 7.9 and within 180 days    Hemoglobin A1C  Date Value Ref Range Status  07/14/2022 9.3 (A) 4.0 - 5.6 % Final   Hgb A1c MFr Bld  Date Value Ref Range Status  03/31/2022 8.9 (H) <5.7 % of total Hgb Final    Comment:    For someone without known diabetes, a hemoglobin A1c value of 6.5% or greater indicates that they may have  diabetes and this should be confirmed with a follow-up  test. . For someone with known diabetes, a value <7% indicates  that their diabetes is well controlled and a value  greater than or equal to 7% indicates suboptimal  control. A1c targets should be individualized based on  duration of diabetes, age, comorbid conditions, and  other considerations. . Currently, no consensus exists regarding use of hemoglobin A1c for diagnosis of diabetes for children. .          Failed - B12 Level in normal range and within 720 days    Vitamin B-12  Date Value Ref Range Status  06/13/2022 175 (L) 180 - 914 pg/mL Final    Comment:    (NOTE) This assay is not validated for testing neonatal or myeloproliferative syndrome specimens for Vitamin B12 levels. Performed at [facility], [place] [address] [place],  [address]          Passed - Cr in normal range and within 360 days    Creat  Date Value Ref Range Status  03/31/2022 1.05 0.70 - 1.28 mg/dL Final   Creatinine, Ser  Date Value Ref Range Status  06/12/2022 1.07 0.61 - 1.24 mg/dL Final         Passed - eGFR in normal range and within 360 days    GFR, Est African American  Date Value Ref Range Status  03/18/2021 104 > OR = 60 mL/min/1.4m Final   GFR, Est Non African  American  Date Value Ref Range Status  03/18/2021 90 > OR = 60 mL/min/1.776mFinal   GFR, Estimated  Date Value Ref Range Status  06/12/2022 >60 >60 mL/min Final    Comment:    (NOTE) Calculated using the CKD-EPI Creatinine Equation (2021)    eGFR  Date Value Ref Range Status  03/31/2022 76 > OR = 60 mL/min/1.7330minal    Comment:    The eGFR is based on the CKD-EPI 2021 equation. To calculate  the new eGFR from a previous Creatinine or Cystatin C result, go to https://www.kidney.org/professionals/ kdoqi/gfr%5Fcalculator          Passed - Valid encounter within last 6 months    Recent Outpatient Visits          3 weeks ago Generalized weakness   [place]   1 month ago [facility]   [facility] months [facility]   5 months ago Essential hypertension   [place]   6  months ago Type 2 diabetes mellitus with peripheral neuropathy ([place])   [facility] Olin Hauser, DO      Future Appointments            In 2 weeks [name], MD [facility] [place]           Passed - CBC within normal limits and completed in the last 12 months    WBC  Date Value Ref Range Status  06/12/2022 12.8 (H) 4.0 - 10.5 K/uL Final   RBC  Date Value Ref Range Status  06/12/2022 5.14 4.22 - 5.81 MIL/uL Final   Hemoglobin  Date Value Ref Range Status  06/12/2022 13.2 13.0 - 17.0 g/dL Final   HCT  Date Value Ref Range Status  06/12/2022 41.6 39.0 - 52.0 % Final   MCHC  Date Value Ref Range Status  06/12/2022 31.7 30.0 - 36.0 g/dL Final   [facility]  Date Value Ref Range Status  06/12/2022 25.7 (L) 26.0 - 34.0 pg Final   MCV  Date Value Ref Range Status  06/12/2022 80.9 80.0 - 100.0 fL Final   No results found  for: "PLTCOUNTKUC", "LABPLAT", "POCPLA" RDW  Date Value Ref Range Status  06/12/2022 13.3 11.5 - 15.5 % Final

## 2022-08-09 ENCOUNTER — Other Ambulatory Visit: Payer: BC Managed Care – PPO

## 2022-08-11 ENCOUNTER — Ambulatory Visit (HOSPITAL_BASED_OUTPATIENT_CLINIC_OR_DEPARTMENT_OTHER): Payer: BC Managed Care – PPO

## 2022-08-11 ENCOUNTER — Ambulatory Visit
Admission: RE | Admit: 2022-08-11 | Discharge: 2022-08-11 | Disposition: A | Discharge status: Home / Self Care | Payer: BC Managed Care – PPO | Source: Ambulatory Visit | Attending: Student in an Organized Health Care Education/Training Program | Admitting: Student in an Organized Health Care Education/Training Program

## 2022-08-11 DIAGNOSIS — J432 Centrilobular emphysema: Secondary | ICD-10-CM | POA: Insufficient documentation

## 2022-08-11 DIAGNOSIS — E041 Nontoxic single thyroid nodule: Secondary | ICD-10-CM | POA: Insufficient documentation

## 2022-08-11 DIAGNOSIS — R052 Subacute cough: Secondary | ICD-10-CM | POA: Diagnosis not present

## 2022-08-11 MED ORDER — ALBUTEROL SULFATE (2.5 MG/3ML) 0.083% IN NEBU
2.5000 mg | INHALATION_SOLUTION | Freq: Once | RESPIRATORY_TRACT | Status: AC
  Administered 2022-08-11: 2.5 mg via RESPIRATORY_TRACT
  Filled 2022-08-11: qty 3

## 2022-08-18 LAB — PULMONARY FUNCTION TEST ARMC ONLY
DL/VA % pred: 93 %
DL/VA: 3.76 ml/min/mmHg/L
DLCO unc % pred: 75 %
DLCO unc: 19.28 ml/min/mmHg
FEF 25-75 Post: 1.02 L/sec
FEF 25-75 Pre: 1.7 L/sec
FEF2575-%Change-Post: -40 %
FEF2575-%Pred-Post: 42 %
FEF2575-%Pred-Pre: 71 %
FEV1-%Change-Post: -10 %
FEV1-%Pred-Post: 61 %
FEV1-%Pred-Pre: 68 %
FEV1-Post: 1.96 L
FEV1-Pre: 2.19 L
FEV1FVC-%Change-Post: 0 %
FEV1FVC-%Pred-Pre: 99 %
FEV6-%Change-Post: -9 %
FEV6-%Pred-Post: 65 %
FEV6-%Pred-Pre: 72 %
FEV6-Post: 2.7 L
FEV6-Pre: 2.99 L
FEV6FVC-%Change-Post: 0 %
FEV6FVC-%Pred-Post: 106 %
FEV6FVC-%Pred-Pre: 105 %
FVC-%Change-Post: -10 %
FVC-%Pred-Post: 62 %
FVC-%Pred-Pre: 69 %
FVC-Post: 2.7 L
FVC-Pre: 3.01 L
Post FEV1/FVC ratio: 73 %
Post FEV6/FVC ratio: 100 %
Pre FEV1/FVC ratio: 73 %
Pre FEV6/FVC Ratio: 99 %
RV % pred: 96 %
RV: 2.41 L
TLC % pred: 73 %
TLC: 5.14 L

## 2022-08-19 ENCOUNTER — Other Ambulatory Visit: Payer: Self-pay | Admitting: Family Medicine

## 2022-08-19 DIAGNOSIS — E042 Nontoxic multinodular goiter: Secondary | ICD-10-CM

## 2022-08-19 DIAGNOSIS — R9389 Abnormal findings on diagnostic imaging of other specified body structures: Secondary | ICD-10-CM

## 2022-08-25 ENCOUNTER — Encounter: Payer: Self-pay | Admitting: Student in an Organized Health Care Education/Training Program

## 2022-08-25 ENCOUNTER — Other Ambulatory Visit: Payer: Self-pay | Admitting: Student in an Organized Health Care Education/Training Program

## 2022-08-25 ENCOUNTER — Ambulatory Visit (INDEPENDENT_AMBULATORY_CARE_PROVIDER_SITE_OTHER): Payer: BC Managed Care – PPO | Admitting: Student in an Organized Health Care Education/Training Program

## 2022-08-25 ENCOUNTER — Other Ambulatory Visit: Payer: Self-pay | Admitting: Internal Medicine

## 2022-08-25 VITALS — BP 142/80 | HR 83 | Temp 97.5°F | Ht 70.5 in | Wt 239.0 lb

## 2022-08-25 DIAGNOSIS — J432 Centrilobular emphysema: Secondary | ICD-10-CM | POA: Diagnosis not present

## 2022-08-25 DIAGNOSIS — R052 Subacute cough: Secondary | ICD-10-CM

## 2022-08-25 DIAGNOSIS — R1310 Dysphagia, unspecified: Secondary | ICD-10-CM

## 2022-08-25 NOTE — Progress Notes (Addendum)
Synopsis: Referred in cough by Henry Horne *  Assessment & Plan:   #Subacute cough #COPD, PRISm on PFT's #History of Empyema #Concern for aspiration   Presented with cough and associated nasal congestion and rhinorrhea during his last visit that has improved significantly with Flonase. Today I did recommend that he re-initiate his second generation anti-histamine. He now reports that his cough is mostly occurring with swallowing and this is concerning for aspiration. I will order a modified barium swallow and an evaluation by SLP.  As for his history of COPD, spirometry shows a preserved ratio with impaired spirometry, consistent with PRISm. It is also showing mild restriction, likely due to chest wall and his central obesity. I did counsel Mr. Gali on the need to loose weight. The drop in DLCO is most likely explained by his emphysema. CT scan does not show parenchymal or endobronchial masses. He has mild lower lobe subpleural reticulation likely due to his history of empyema and is not suggestive of ILD. He will continue using his Breztri two puffs twice daily.  - SLP modified barium swallow; Future - DG OP Swallowing Func-Medicare/Speech Path; Future  Return in about 6 months (around 02/23/2023).  I spent 30 minutes caring for this patient today, including preparing to see the patient, obtaining and/or reviewing separately obtained history, performing a medically appropriate examination and/or evaluation, counseling and educating the patient/family/caregiver, ordering medications, tests, or procedures, documenting clinical information in the electronic health record, and independently interpreting results (not separately reported/billed) and communicating results to the patient/family/caregiver  [name], MD [facility] 08/25/2022 1:54 PM    End of visit medications:  No orders of the defined types were placed in this encounter.    Current  Outpatient Medications:    fluticasone (FLONASE) 50 MCG/ACT nasal spray, Place into the nose. Place 2 sprays into one nostril once daily as needed, Disp: , Rfl:    albuterol (VENTOLIN HFA) 108 (90 Base) MCG/ACT inhaler, INHALE 1 TO 2 PUFFS BY MOUTH EVERY 4 HOURS AS NEEDED FOR WHEEZING OR SHORTNESS OF BREATH, Disp: 9 g, Rfl: 0   amLODipine (NORVASC) 5 MG tablet, Take 1 tablet by mouth once daily, Disp: 90 tablet, Rfl: 0   benzonatate (TESSALON) 200 MG capsule, Take 1 capsule (200 mg total) by mouth 3 (three) times daily as needed for cough., Disp: 30 capsule, Rfl: 0   BREZTRI AEROSPHERE 160-9-4.8 MCG/ACT AERO, Inhale 2 puffs into the lungs 2 (two) times daily., Disp: 10.7 g, Rfl: 11   diclofenac sodium (VOLTAREN) 1 % GEL, Apply 2 g topically 3 (three) times daily as needed. For knees, Disp: 100 g, Rfl: 2   FLUoxetine (PROZAC) 40 MG capsule, Take 1 capsule by mouth once daily, Disp: 90 capsule, Rfl: 1   fluticasone (FLONASE) 50 MCG/ACT nasal spray, INSTILL 2 SPRAYS INTO BOTH NOSTRIL DAILY. USE FOR 4 TO 6 WEEKS THEN STOP AND USE SEASONALLY OR AS NEEDED., Disp: 16 g, Rfl: 3   Garlic Oil 2 MG CAPS, Take 1,000 mg by mouth 2 (two) times daily. Reported on 11/17/2015, Disp: , Rfl:    HYDROcodone bit-homatropine (HYCODAN) 5-1.5 MG/5ML syrup, Take 5 mLs by mouth every 8 (eight) hours as needed for cough., Disp: 120 mL, Rfl: 0   imipramine (TOFRANIL) 10 MG tablet, Take 10 mg by mouth at bedtime., Disp: , Rfl:    insulin glargine (LANTUS) 100 UNIT/ML injection, INJECT 70 UNITS SUBCUTANEOUSLY ONCE DAILY, Disp: 60 mL, Rfl: 1   loratadine (CLARITIN) 10 MG  tablet, Take 1 tablet (10 mg total) by mouth daily., Disp: 30 tablet, Rfl: 11   losartan (COZAAR) 50 MG tablet, Take 1 tablet (50 mg total) by mouth daily., Disp: 90 tablet, Rfl: 3   metFORMIN (GLUCOPHAGE) 1000 MG tablet, TAKE 1 TABLET BY MOUTH TWICE DAILY WITH MEALS, Disp: 180 tablet, Rfl: 1   MOUNJARO 5 MG/0.5ML Pen, INJECT 5 MG  SUBCUTANEOUSLY ONCE A WEEK,  Disp: 6 mL, Rfl: 0   omeprazole (PRILOSEC) 40 MG capsule, Take 1 capsule (40 mg total) by mouth daily., Disp: 30 capsule, Rfl: 3   Subjective:   PATIENT ID: Maris Berger GENDER: male DOB: 06/28/1951, MRN: [id number]  Chief Complaint  Patient presents with   Follow-up    Go over test results. No SOB. Coughing.    HPI  Mr. Gullett is a pleasant 71 year old male patient presenting to clinic for follow up regarding his cough. He is accompanied by his wife.  Today, they report that the cough has changed characteristic. The cough used to happen all the time, and now they only notice it when he's eating. The sporadic cough that was occurring during the day has mostly subsided and is no longer bothering him. He now notices that he coughs every time he is eating or swallowing. These bouts also leave him out of breath. The cough is nonproductive and not associated with hemoptysis. He denies any chest pain or chest tightness and also denies any wheezing.    He was seen by his primary care physician and treated for a COPD exacerbation in July with prednisone and antibiotics. He was started on Breztri and has been compliant with it. His wheezing has significantly improved. He is using Flonase daily, but has not used his Zyrtec or Claritin.  Following our last visit, I had ordered a CT of the chest that showed a thyroid nodule. He is scheduled to have a thyroid biopsy tomorrow.   His past medical history is significant for a left-sided empyema for which he required 2 chest tubes and "washout via tube "at [facility].  He has also had history of bladder cancer as well as prostate cancer.  He is a former smoker (quit in 2005, 30 pack years).  He works at [employer].  Ancillary information including prior medications, full medical/surgical/family/social histories, and PFTs (when available) are listed below and have been reviewed.   Review of Systems  Constitutional:  Negative for chills and  fever. Weight loss: 10 lbs intentional. HENT:  Negative for congestion and sinus pain.   Respiratory:  Positive for cough (with swallowing). Negative for hemoptysis, sputum production and wheezing.   Cardiovascular:  Negative for chest pain and palpitations.     Objective:   Vitals:   08/25/22 1338  BP: (!) 142/80  Pulse: 83  Temp: (!) 97.5 F (36.4 C)  SpO2: 95%  Weight: 239 lb (108.4 kg)  Height: 5' 10.5" (1.791 m)   95% on RA  BMI Readings from Last 3 Encounters:  08/25/22 33.81 kg/m  07/21/22 33.10 kg/m  07/14/22 33.37 kg/m   Wt Readings from Last 3 Encounters:  08/25/22 239 lb (108.4 kg)  07/21/22 234 lb (106.1 kg)  07/14/22 232 lb 9.6 oz (105.5 kg)    Physical Exam Constitutional:      Appearance: He is obese.  HENT:     Head: Normocephalic.     Mouth/Throat:     Mouth: Mucous membranes are moist.  Eyes:     Pupils: Pupils are equal,  round, and reactive to light.  Neck:     Comments: No notable thyroid enlargement on inspection or palpation Cardiovascular:     Rate and Rhythm: Normal rate and regular rhythm.     Pulses: Normal pulses.     Heart sounds: Normal heart sounds.  Pulmonary:     Effort: Pulmonary effort is normal.     Comments: Distant breath sounds bilaterally. Abdominal:     General: There is distension.     Palpations: Abdomen is soft.  Musculoskeletal:     Cervical back: Normal range of motion.     Right lower leg: No edema.     Left lower leg: No edema.  Neurological:     General: No focal deficit present.     Mental Status: He is alert and oriented to person, place, and time. Mental status is at baseline.       Ancillary Information    Past Medical History:  Diagnosis Date   Anxiety    Cancer of kidney ([place]) 11/28/2008   cystoscopy 2010, 2014, 2015, 2016- MD q 6 months   Depression    Emphysema of lung ([place]) 11/28/1994   Hyperlipidemia    Hypertension    Joint pain    Prostate cancer ([place]) 11/28/2006   removed  with cryotherapy   SDH (subdural hematoma) ([place])      Family History  Problem Relation Age of Onset   Diabetes Mother    Heart disease Father    Cancer Father    Hyperlipidemia Father    Hypertension Father    Diabetes Brother    COPD Brother    Autoimmune disease Brother    Heart disease Brother    Diabetes Brother      Past Surgical History:  Procedure Laterality Date   DIGIT NAIL REMOVAL Left 2016   left and right foot -toe nails    Social History   Socioeconomic History   Marital status: Married    Spouse name: Butch Penny   Number of children: Not on file   Years of education: Not on file   Highest education level: Not on file  Occupational History   Not on file  Tobacco Use   Smoking status: Former    Packs/day: 1.00    Years: 30.00    Total pack years: 30.00    Types: Cigarettes    Quit date: 05/18/2004    Years since quitting: 18.2   Smokeless tobacco: Former  Scientific laboratory technician Use: Never used  Substance and Sexual Activity   Alcohol use: No    Alcohol/week: 0.0 standard drinks of alcohol   Drug use: No   Sexual activity: Not on file  Other Topics Concern   Not on file  Social History Narrative   Not on file   Social Determinants of Health   Financial Resource Strain: Not on file  Food Insecurity: Not on file  Transportation Needs: Not on file  Physical Activity: Not on file  Stress: Not on file  Social Connections: Not on file  Intimate Partner Violence: Not on file     Allergies  Allergen Reactions   Zetia [Ezetimibe] Other (See Comments)    Joint pain, congestion, muscle ache   Ace Inhibitors Cough   Pneumococcal Vaccine    Pneumococcal Vaccines      CBC    Component Value Date/Time   WBC 12.8 (H) 06/12/2022 2139   RBC 5.14 06/12/2022 2139   HGB 13.2 06/12/2022 2139   HCT  41.6 06/12/2022 2139   PLT 241 06/12/2022 2139   MCV 80.9 06/12/2022 2139   MCH 25.7 (L) 06/12/2022 2139   MCHC 31.7 06/12/2022 2139   RDW 13.3 06/12/2022  2139   LYMPHSABS 1,494 03/31/2022 0800   MONOABS 740 12/30/2016 0001   EOSABS 268 03/31/2022 0800   BASOSABS 80 03/31/2022 0800    Pulmonary Functions Testing Results:     No data to display          Outpatient Medications Prior to Visit  Medication Sig Dispense Refill   fluticasone (FLONASE) 50 MCG/ACT nasal spray Place into the nose. Place 2 sprays into one nostril once daily as needed     albuterol (VENTOLIN HFA) 108 (90 Base) MCG/ACT inhaler INHALE 1 TO 2 PUFFS BY MOUTH EVERY 4 HOURS AS NEEDED FOR WHEEZING OR SHORTNESS OF BREATH 9 g 0   amLODipine (NORVASC) 5 MG tablet Take 1 tablet by mouth once daily 90 tablet 0   benzonatate (TESSALON) 200 MG capsule Take 1 capsule (200 mg total) by mouth 3 (three) times daily as needed for cough. 30 capsule 0   BREZTRI AEROSPHERE 160-9-4.8 MCG/ACT AERO Inhale 2 puffs into the lungs 2 (two) times daily. 10.7 g 11   diclofenac sodium (VOLTAREN) 1 % GEL Apply 2 g topically 3 (three) times daily as needed. For knees 100 g 2   FLUoxetine (PROZAC) 40 MG capsule Take 1 capsule by mouth once daily 90 capsule 1   fluticasone (FLONASE) 50 MCG/ACT nasal spray INSTILL 2 SPRAYS INTO BOTH NOSTRIL DAILY. USE FOR 4 TO 6 WEEKS THEN STOP AND USE SEASONALLY OR AS NEEDED. 16 g 3   Garlic Oil 2 MG CAPS Take 1,000 mg by mouth 2 (two) times daily. Reported on 11/17/2015     HYDROcodone bit-homatropine (HYCODAN) 5-1.5 MG/5ML syrup Take 5 mLs by mouth every 8 (eight) hours as needed for cough. 120 mL 0   imipramine (TOFRANIL) 10 MG tablet Take 10 mg by mouth at bedtime.     insulin glargine (LANTUS) 100 UNIT/ML injection INJECT 70 UNITS SUBCUTANEOUSLY ONCE DAILY 60 mL 1   loratadine (CLARITIN) 10 MG tablet Take 1 tablet (10 mg total) by mouth daily. 30 tablet 11   losartan (COZAAR) 50 MG tablet Take 1 tablet (50 mg total) by mouth daily. 90 tablet 3   metFORMIN (GLUCOPHAGE) 1000 MG tablet TAKE 1 TABLET BY MOUTH TWICE DAILY WITH MEALS 180 tablet 1   MOUNJARO 5 MG/0.5ML  Pen INJECT 5 MG  SUBCUTANEOUSLY ONCE A WEEK 6 mL 0   omeprazole (PRILOSEC) 40 MG capsule Take 1 capsule (40 mg total) by mouth daily. 30 capsule 3   No facility-administered medications prior to visit.

## 2022-08-26 DIAGNOSIS — E041 Nontoxic single thyroid nodule: Secondary | ICD-10-CM | POA: Diagnosis not present

## 2022-08-26 DIAGNOSIS — R1314 Dysphagia, pharyngoesophageal phase: Secondary | ICD-10-CM | POA: Diagnosis not present

## 2022-08-26 DIAGNOSIS — K219 Gastro-esophageal reflux disease without esophagitis: Secondary | ICD-10-CM | POA: Diagnosis not present

## 2022-08-31 ENCOUNTER — Other Ambulatory Visit: Payer: Self-pay | Admitting: Otolaryngology

## 2022-08-31 DIAGNOSIS — E041 Nontoxic single thyroid nodule: Secondary | ICD-10-CM

## 2022-09-08 ENCOUNTER — Other Ambulatory Visit: Payer: Self-pay | Admitting: Family Medicine

## 2022-09-08 ENCOUNTER — Ambulatory Visit: Payer: BC Managed Care – PPO | Admitting: Neurology

## 2022-09-08 DIAGNOSIS — I10 Essential (primary) hypertension: Secondary | ICD-10-CM

## 2022-09-09 ENCOUNTER — Ambulatory Visit
Admission: RE | Admit: 2022-09-09 | Discharge: 2022-09-09 | Disposition: A | Discharge status: Home / Self Care | Payer: BC Managed Care – PPO | Source: Ambulatory Visit | Attending: Otolaryngology | Admitting: Otolaryngology

## 2022-09-09 DIAGNOSIS — E042 Nontoxic multinodular goiter: Secondary | ICD-10-CM | POA: Diagnosis not present

## 2022-09-09 DIAGNOSIS — E041 Nontoxic single thyroid nodule: Secondary | ICD-10-CM | POA: Diagnosis not present

## 2022-09-09 NOTE — Telephone Encounter (Signed)
Requested Prescriptions  Pending Prescriptions Disp Refills  . amLODipine (NORVASC) 5 MG tablet [Pharmacy Med Name: amLODIPine Besylate 5 MG Oral Tablet] 90 tablet 0    Sig: Take 1 tablet by mouth once daily     Cardiovascular: Calcium Channel Blockers 2 Failed - 09/08/2022  5:55 PM      Failed - Last BP in normal range    BP Readings from Last 1 Encounters:  08/25/22 (!) 142/80         Passed - Last Heart Rate in normal range    Pulse Readings from Last 1 Encounters:  08/25/22 83         Passed - Valid encounter within last 6 months    Recent Outpatient Visits          1 month ago Generalized weakness   [place], DO   2 months ago Acute bronchitis with COPD [facility])   [place], DO   5 months ago Annual physical exam   [place], DO   7 months ago Essential hypertension   [place], DO   7 months ago Type 2 diabetes mellitus with peripheral neuropathy [facility])   [place], Devonne Doughty, DO

## 2022-09-09 NOTE — Procedures (Signed)
  Procedure:  US thyroid FNA x2 Right upper and mid nodules (2) Preprocedure diagnosis: The encounter diagnosis was Thyroid nodule.  Postprocedure diagnosis: same EBL:    minimal Complications:   none immediate  See full dictation in [facility].  Dillard Cannon MD Main # [phone] Pager  [phone] Mobile [phone]

## 2022-09-12 LAB — CYTOLOGY - NON PAP

## 2022-09-15 ENCOUNTER — Ambulatory Visit
Admission: RE | Admit: 2022-09-15 | Discharge: 2022-09-15 | Disposition: A | Discharge status: Home / Self Care | Payer: BC Managed Care – PPO | Source: Ambulatory Visit | Attending: Student in an Organized Health Care Education/Training Program | Admitting: Student in an Organized Health Care Education/Training Program

## 2022-09-15 DIAGNOSIS — R052 Subacute cough: Secondary | ICD-10-CM | POA: Insufficient documentation

## 2022-09-15 DIAGNOSIS — R1313 Dysphagia, pharyngeal phase: Secondary | ICD-10-CM | POA: Diagnosis not present

## 2022-09-15 DIAGNOSIS — R1310 Dysphagia, unspecified: Secondary | ICD-10-CM | POA: Diagnosis not present

## 2022-09-15 NOTE — Therapy (Signed)
[place], [place], [address] Phone: [phone]   Fax:     Modified Barium Swallow  Patient Details  Name: Henry Horne MRN: [id number] Date of Birth: [birth year] No data recorded  Encounter Date: 09/15/2022   End of Session - 09/15/22 1656     Visit Number 1    Number of Visits 1    Date for SLP Re-Evaluation 09/15/22    SLP Start Time 1300    SLP Stop Time  0240    SLP Time Calculation (min) 75 min    Activity Tolerance Patient tolerated treatment well             Past Medical History:  Diagnosis Date   Anxiety    Cancer of kidney ([place]) 11/28/2008   cystoscopy 2010, 2014, 2015, 2016- MD q 6 months   Depression    Emphysema of lung ([place]) 11/28/1994   Hyperlipidemia    Hypertension    Joint pain    Prostate cancer ([place]) 11/28/2006   removed with cryotherapy   SDH (subdural hematoma) ([place])     Past Surgical History:  Procedure Laterality Date   DIGIT NAIL REMOVAL Left 2016   left and right foot -toe nails    There were no vitals filed for this visit.   Subjective: Patient behavior: (alertness, ability to follow instructions, etc.): Chief complaint: dysphagia. Pt c/o intermittent difficulty w/ swallowing "for a long time now". He endorsed intermittent coughing during oral intake and OUTSIDE of any oral intake on his own Saliva. He denied any particular foods or liquids increasing the coughing.  (Wife stated he coughs when eating popcicles but that he is "RECLINED FLAT in his recliner at night eating them". She tells him to sit up frequently.) Pt has a Baseline h/o ongoing REFLUX; managed by PPI currently 1x daily -- he endorsed the REFLUX "used to really burn me up" and that he had "ulcers" as a child.   OM Exam: appeared WNL w/ no unilateral weakness. Cough - strong. Dentition: missing several.    Objective:  Radiological Procedure: A videoflouroscopic evaluation of  oral-preparatory, reflex initiation, and pharyngeal phases of the swallow was performed; as well as a screening of the upper esophageal phase.  POSTURE: upright VIEW: lateral COMPENSATORY STRATEGIES: SMALL, SINGLE sips SLOWLY BOLUSES ADMINISTERED:  Thin Liquid: 2 via TSP; 8 via CUP  Nectar-thick Liquid: 1 via TSP; 2 via CUP  Honey-thick Liquid: NT  Puree: 1 trial  Mechanical Soft: 1 trial RESULTS OF EVALUATION: ORAL PREPARATORY PHASE: (The lips, tongue, and velum are observed for strength and coordination)       **Overall Severity Rating: grossly WFL. Gave increased attention and Time to thorough mastication of increased textured trial d/t Missing Dentition. Oral clearing appropriate.   SWALLOW INITIATION/REFLEX: (The reflex is normal if "triggered" by the time the bolus reached the base of the tongue)  **Overall Severity Rating: MILD. Delayed pharyngeal swallow initiation w/ thin liquid consistency trials -- spilled into and/or filled Pyriform Sinuses during pharyngeal swallow initiation. This, and incomplete epiglottic inversion and reduced airway protection, resulted in Aspiration of thin liquids x1(Silent) on Initial bolus trial; inconsistent, "Flash" laryngeal penetration(min) x4 w/ Larger sips. Spontaneous clearing of laryngeal penetration noted w/ completion of the swallow - no build-up of bolus residue remained in the laryngeal vestibule. When pt utilized strategies of SMALL, SINGLE sips SLOWLY, no further laryngeal penetration was noted to occur.   PHARYNGEAL PHASE: (Pharyngeal function is  normal if the bolus shows rapid, smooth, and continuous transit through the pharynx and there is no pharyngeal residue after the swallow)  **Overall Severity Rating: [facility].  LARYNGEAL PENETRATION: (Material entering into the laryngeal inlet/vestibule but not aspirated): x4 ASPIRATION: x1 on Initial bolus trial/swallow (Silent) ESOPHAGEAL PHASE: (Screening of the upper esophagus): no bolus dysmotility  noted in the viewable cervical esophagus.   ASSESSMENT:   PLAN/RECOMMENDATIONS:  A. Diet: Regular/Mech soft diet -- meats/foods cut SMALL and moistened for Cohesion for ease of mastication and control when swallowing (monitor and/or Particulate foods if problematic); Thin liquids VIA CUP following aspiration precautions/strategies of SMALL, SINGLE SIPS SLOWLY.  Pills swallowed in a a PUREE for safer swallowing.    B. Swallowing Precautions: aspiration precautions; REFLUX precautions.   C. Recommended consultation to: f/u w/ GI/MD re: management of REFLUX; PPI.   D. Therapy recommendations: None at this time  E. Results and recommendations were discussed w/ patient and Wife; video viewed w/ patient. Questions answered; discussed aspiration precautions and monitoring of Pulmonary status and f/u w/ GI/MD re: Reflux management. Handouts given.         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   Obesity (BMI 30.0-34.9) 01/17/2018   Osteoarthritis of multiple joints 10/04/2017   Asthma 12/30/2016   ACE-inhibitor cough 12/30/2016   Agitation 06/16/2016   Personal history of bladder cancer 05/12/2016   Type 2 diabetes mellitus with peripheral neuropathy ([place]) 12/25/2015   Hyperlipidemia associated with type 2 diabetes mellitus ([place]) 11/02/2015   Asymptomatic bacteriuria 10/08/2015   Essential hypertension 07/29/2015   Urinary obstruction, not elsewhere classified 06/19/2013   Personal history of prostate cancer 12/19/2012   Family history of malignant neoplasm  of prostate 12/12/2012   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

## 2022-09-28 ENCOUNTER — Other Ambulatory Visit: Payer: Self-pay

## 2022-09-28 DIAGNOSIS — K219 Gastro-esophageal reflux disease without esophagitis: Secondary | ICD-10-CM

## 2022-09-28 DIAGNOSIS — R058 Other specified cough: Secondary | ICD-10-CM

## 2022-09-28 MED ORDER — OMEPRAZOLE 40 MG PO CPDR: 40.0000 mg | DELAYED_RELEASE_CAPSULE | Freq: Every day | ORAL | 3 refills | Status: AC

## 2022-09-29 ENCOUNTER — Other Ambulatory Visit: Payer: Self-pay

## 2022-09-29 DIAGNOSIS — K219 Gastro-esophageal reflux disease without esophagitis: Secondary | ICD-10-CM

## 2022-09-29 DIAGNOSIS — R058 Other specified cough: Secondary | ICD-10-CM

## 2022-10-18 ENCOUNTER — Other Ambulatory Visit: Payer: Self-pay | Admitting: Family Medicine

## 2022-10-18 DIAGNOSIS — J4520 Mild intermittent asthma, uncomplicated: Secondary | ICD-10-CM

## 2022-10-18 NOTE — Telephone Encounter (Signed)
Requested Prescriptions  Pending Prescriptions Disp Refills   albuterol (VENTOLIN HFA) 108 (90 Base) MCG/ACT inhaler [Pharmacy Med Name: Albuterol Sulfate HFA 108 (90 Base) MCG/ACT Inhalation Aerosol Solution] 7 each 0    Sig: INHALE 1 TO 2 PUFFS BY MOUTH EVERY 4 HOURS AS NEEDED FOR WHEEZING OR SHORTNESS OF BREATH     Pulmonology:  Beta Agonists 2 Failed - 10/18/2022  3:51 PM      Failed - Last BP in normal range    BP Readings from Last 1 Encounters:  09/09/22 (!) 168/82         Passed - Last Heart Rate in normal range    Pulse Readings from Last 1 Encounters:  09/09/22 81         Passed - Valid encounter within last 12 months    Recent Outpatient Visits           3 months ago Generalized weakness   [place], DO   4 months ago Acute bronchitis with COPD [facility])   [place], DO   6 months ago Annual physical exam   [facility] [place] Hauser, DO   8 months ago Essential hypertension   [place], DO   8 months ago Type 2 diabetes mellitus with peripheral neuropathy [facility])   [place], Devonne Doughty, DO

## 2022-10-25 ENCOUNTER — Other Ambulatory Visit: Payer: Self-pay | Admitting: Family Medicine

## 2022-10-25 DIAGNOSIS — R451 Restlessness and agitation: Secondary | ICD-10-CM

## 2022-10-25 NOTE — Telephone Encounter (Signed)
Requested Prescriptions  Pending Prescriptions Disp Refills   FLUoxetine (PROZAC) 40 MG capsule [Pharmacy Med Name: FLUoxetine HCl 40 MG Oral Capsule] 90 capsule 0    Sig: Take 1 capsule (40 mg total) by mouth daily.     Psychiatry:  Antidepressants - SSRI Passed - 10/25/2022 10:55 AM      Passed - Completed PHQ-2 or PHQ-9 in the last 360 days      Passed - Valid encounter within last 6 months    Recent Outpatient Visits           3 months ago Generalized weakness   [place], DO   4 months ago Acute bronchitis with COPD [facility])   [place], DO   6 months ago Annual physical exam   [place], DO   8 months ago Essential hypertension   [place], DO   8 months ago Type 2 diabetes mellitus with peripheral neuropathy [facility])   [facility], Devonne Doughty, DO

## 2022-11-03 ENCOUNTER — Other Ambulatory Visit: Payer: Self-pay | Admitting: Family Medicine

## 2022-11-03 DIAGNOSIS — M159 Polyosteoarthritis, unspecified: Secondary | ICD-10-CM

## 2022-11-03 MED ORDER — TRAMADOL HCL 50 MG PO TABS: 50.0000 mg | ORAL_TABLET | Freq: Three times a day (TID) | ORAL | 2 refills | Status: DC | PRN

## 2022-11-04 ENCOUNTER — Encounter: Payer: Self-pay | Admitting: Internal Medicine

## 2022-11-04 ENCOUNTER — Ambulatory Visit (INDEPENDENT_AMBULATORY_CARE_PROVIDER_SITE_OTHER): Payer: BC Managed Care – PPO | Admitting: Internal Medicine

## 2022-11-04 VITALS — BP 152/84 | HR 85 | Temp 96.9°F | Wt 241.0 lb

## 2022-11-04 DIAGNOSIS — J441 Chronic obstructive pulmonary disease with (acute) exacerbation: Secondary | ICD-10-CM

## 2022-11-04 MED ORDER — PREDNISONE 10 MG PO TABS: ORAL_TABLET | ORAL | 0 refills | Status: DC

## 2022-11-04 MED ORDER — HYDROCOD POLI-CHLORPHE POLI ER 10-8 MG/5ML PO SUER: 5.0000 mL | Freq: Two times a day (BID) | ORAL | 0 refills | Status: DC | PRN

## 2022-11-04 MED ORDER — BENZONATATE 200 MG PO CAPS: 200.0000 mg | ORAL_CAPSULE | Freq: Three times a day (TID) | ORAL | 0 refills | Status: DC | PRN

## 2022-11-04 NOTE — Patient Instructions (Signed)

## 2022-11-04 NOTE — Progress Notes (Signed)
Subjective:    Patient ID: Henry Horne, male    DOB: 10/18/1951, 71 y.o.   MRN: [id number]  HPI  Patient presents to clinic today with complaint of runny nose, nasal congestion, cough and chest congestion. This started yesterday.  He is blowing clear mucus out of his nose.  The cough is productive of green mucus.  He denies headache, ear pain, sore throat, shortness of breath, chest pain, nausea, vomiting or diarrhea.  He denies fever, chills or body aches.  He has tried Corcidin HBP, an antihistamine and Flonase with minimal relief of symptoms. He has not had know exposure to covid or flu but he works at [employer]. He has a history of DM 2, chronic cough, asthma and COPD (managed on Breztri and Albuterol).  Review of Systems     Past Medical History:  Diagnosis Date   Anxiety    Cancer of kidney ([place]) 11/28/2008   cystoscopy 2010, 2014, 2015, 2016- MD q 6 months   Depression    Emphysema of lung ([place]) 11/28/1994   Hyperlipidemia    Hypertension    Joint pain    Prostate cancer ([place]) 11/28/2006   removed with cryotherapy   SDH (subdural hematoma) ([facility])     Current Outpatient Medications  Medication Sig Dispense Refill   albuterol (VENTOLIN HFA) 108 (90 Base) MCG/ACT inhaler INHALE 1 TO 2 PUFFS BY MOUTH EVERY 4 HOURS AS NEEDED FOR WHEEZING OR SHORTNESS OF BREATH 7 each 0   amLODipine (NORVASC) 5 MG tablet Take 1 tablet by mouth once daily 90 tablet 1   BREZTRI AEROSPHERE 160-9-4.8 MCG/ACT AERO Inhale 2 puffs into the lungs 2 (two) times daily. 10.7 g 11   diclofenac sodium (VOLTAREN) 1 % GEL Apply 2 g topically 3 (three) times daily as needed. For knees 100 g 2   FLUoxetine (PROZAC) 40 MG capsule Take 1 capsule (40 mg total) by mouth daily. 90 capsule 0   fluticasone (FLONASE) 50 MCG/ACT nasal spray INSTILL 2 SPRAYS INTO BOTH NOSTRIL DAILY. USE FOR 4 TO 6 WEEKS THEN STOP AND USE SEASONALLY OR AS NEEDED. 16 g 3   fluticasone (FLONASE) 50 MCG/ACT nasal spray Place into the nose.  Place 2 sprays into one nostril once daily as needed     Garlic Oil 2 MG CAPS Take 1,000 mg by mouth 2 (two) times daily. Reported on 11/17/2015     imipramine (TOFRANIL) 10 MG tablet Take 10 mg by mouth at bedtime.     insulin glargine (LANTUS) 100 UNIT/ML injection INJECT 70 UNITS SUBCUTANEOUSLY ONCE DAILY 60 mL 1   loratadine (CLARITIN) 10 MG tablet Take 1 tablet (10 mg total) by mouth daily. 30 tablet 11   losartan (COZAAR) 50 MG tablet Take 1 tablet (50 mg total) by mouth daily. 90 tablet 3   metFORMIN (GLUCOPHAGE) 1000 MG tablet TAKE 1 TABLET BY MOUTH TWICE DAILY WITH MEALS 180 tablet 1   MOUNJARO 5 MG/0.5ML Pen INJECT 5 MG  SUBCUTANEOUSLY ONCE A WEEK 6 mL 0   omeprazole (PRILOSEC) 40 MG capsule Take 1 capsule (40 mg total) by mouth daily. 30 capsule 3   traMADol (ULTRAM) 50 MG tablet Take 1 tablet (50 mg total) by mouth every 8 (eight) hours as needed. 30 tablet 2   No current facility-administered medications for this visit.    Allergies  Allergen Reactions   Zetia [Ezetimibe] Other (See Comments)    Joint pain, congestion, muscle ache   Ace Inhibitors Cough   Pneumococcal  Vaccine    Pneumococcal Vaccines     Family History  Problem Relation Age of Onset   Diabetes Mother    Heart disease Father    Cancer Father    Hyperlipidemia Father    Hypertension Father    Diabetes Brother    COPD Brother    Autoimmune disease Brother    Heart disease Brother    Diabetes Brother     Social History   Socioeconomic History   Marital status: Married    Spouse name: Butch Penny   Number of children: Not on file   Years of education: Not on file   Highest education level: Not on file  Occupational History   Not on file  Tobacco Use   Smoking status: Former    Packs/day: 1.00    Years: 30.00    Total pack years: 30.00    Types: Cigarettes    Quit date: 05/18/2004    Years since quitting: 18.4   Smokeless tobacco: Former  Scientific laboratory technician Use: Never used  Substance and  Sexual Activity   Alcohol use: No    Alcohol/week: 0.0 standard drinks of alcohol   Drug use: No   Sexual activity: Not on file  Other Topics Concern   Not on file  Social History Narrative   Not on file   Social Determinants of Health   Financial Resource Strain: Not on file  Food Insecurity: Not on file  Transportation Needs: Not on file  Physical Activity: Not on file  Stress: Not on file  Social Connections: Not on file  Intimate Partner Violence: Not on file     Constitutional: Denies fever, malaise, fatigue, headache or abrupt weight changes.  HEENT: Patient reports runny nose, nasal congestion.  Denies eye pain, eye redness, ear pain, ringing in the ears, wax buildup, bloody nose, or sore throat. Respiratory: Patient reports cough.  Denies difficulty breathing, shortness of breath, or sputum production.   Cardiovascular: Denies chest pain, chest tightness, palpitations or swelling in the hands or feet.  Gastrointestinal: Denies abdominal pain, bloating, constipation, diarrhea or blood in the stool.    No other specific complaints in a complete review of systems (except as listed in HPI above).  Objective:   Physical Exam  BP (!) 152/84 (BP Location: Right Arm, Patient Position: Sitting, Cuff Size: Normal)   Pulse 85   Temp (!) 96.9 F (36.1 C) (Temporal)   Wt 241 lb (109.3 kg)   SpO2 98%   BMI 34.09 kg/m   Wt Readings from Last 3 Encounters:  08/25/22 239 lb (108.4 kg)  07/21/22 234 lb (106.1 kg)  07/14/22 232 lb 9.6 oz (105.5 kg)    General: Appears his stated age, obese, in NAD. Skin: Warm, dry and intact. No rashes, lesions or ulcerations noted. HEENT: Head: normal shape and size, no sinus tenderness noted; Nose: mucosa pink and moist, septum midline; Throat/Mouth: Teeth present, mucosa pink and moist, no exudate, + PND, lesions or ulcerations noted.  Neck: No adenopathy noted. Cardiovascular: Normal rate and rhythm.  Pulmonary/Chest: Normal effort and  diminished breath sounds. No respiratory distress. No wheezes, rales or ronchi noted.   Neurological: Alert and oriented.   BMET    Component Value Date/Time   NA 134 (L) 06/12/2022 2139   NA 139 11/02/2015 1015   K 3.7 06/12/2022 2139   CL 103 06/12/2022 2139   CO2 23 06/12/2022 2139   GLUCOSE 198 (H) 06/12/2022 2139   BUN 19 06/12/2022  2139   BUN 15 11/02/2015 1015   CREATININE 1.07 06/12/2022 2139   CREATININE 1.05 03/31/2022 0800   CALCIUM 8.7 (L) 06/12/2022 2139   GFRNONAA >60 06/12/2022 2139   GFRNONAA 90 03/18/2021 0754   GFRAA 104 03/18/2021 0754    Lipid Panel     Component Value Date/Time   CHOL 174 06/13/2022 0204   CHOL 197 11/02/2015 1015   TRIG 83 06/13/2022 0204   HDL 37 (L) 06/13/2022 0204   HDL 34 (L) 11/02/2015 1015   CHOLHDL 4.7 06/13/2022 0204   VLDL 17 06/13/2022 0204   LDLCALC 120 (H) 06/13/2022 0204   LDLCALC 117 (H) 03/31/2022 0800    CBC    Component Value Date/Time   WBC 12.8 (H) 06/12/2022 2139   RBC 5.14 06/12/2022 2139   HGB 13.2 06/12/2022 2139   HCT 41.6 06/12/2022 2139   PLT 241 06/12/2022 2139   MCV 80.9 06/12/2022 2139   MCH 25.7 (L) 06/12/2022 2139   MCHC 31.7 06/12/2022 2139   RDW 13.3 06/12/2022 2139   LYMPHSABS 1,494 03/31/2022 0800   MONOABS 740 12/30/2016 0001   EOSABS 268 03/31/2022 0800   BASOSABS 80 03/31/2022 0800    Hgb A1C Lab Results  Component Value Date   HGBA1C 9.3 (A) 07/14/2022           Assessment & Plan:   COPD Exacerbation:  Rapid flu: Negative Rapid COVID: Negative Rx for Pred taper x 6 days to start if symptoms worsen No indication for antibiotics at this time Continue Breztri and albuterol as previously prescribed Continue antihistamine and Flonase as previously prescribed Rx for Tessalon 200 mg every 8 hours as needed Rx for Tussionex at bedtime-patient close given  Follow-up with your PCP as previously scheduled. Webb Silversmith, NP

## 2022-11-15 ENCOUNTER — Other Ambulatory Visit: Payer: Self-pay | Admitting: Family Medicine

## 2022-11-15 DIAGNOSIS — E1142 Type 2 diabetes mellitus with diabetic polyneuropathy: Secondary | ICD-10-CM

## 2022-11-15 DIAGNOSIS — J4520 Mild intermittent asthma, uncomplicated: Secondary | ICD-10-CM

## 2022-11-16 NOTE — Telephone Encounter (Signed)
Requested medication (s) are due for refill today: ?  Requested medication (s) are on the active medication list: yes  Last refill:  10/18/22  Future visit scheduled: no  Notes to clinic:  last fill was swritten to dispense "7 each" not in grams- sending back to review   Requested Prescriptions  Pending Prescriptions Disp Refills   albuterol (VENTOLIN HFA) 108 (90 Base) MCG/ACT inhaler [Pharmacy Med Name: Albuterol Sulfate HFA 108 (90 Base) MCG/ACT Inhalation Aerosol Solution] 7 each     Sig: INHALE 1 TO 2 PUFFS BY MOUTH EVERY 4 HOURS AS NEEDED FOR WHEEZING FOR SHORTNESS OF BREATH     Pulmonology:  Beta Agonists 2 Failed - 11/15/2022  2:01 PM      Failed - Last BP in normal range    BP Readings from Last 1 Encounters:  11/04/22 (!) 152/84         Passed - Last Heart Rate in normal range    Pulse Readings from Last 1 Encounters:  11/04/22 85         Passed - Valid encounter within last 12 months    Recent Outpatient Visits           1 week ago COPD exacerbation ([place])   [facility] [place], Coralie Keens, NP   4 months ago Generalized weakness   [place], DO   5 months ago Acute bronchitis with COPD [facility])   [id number] Olin Hauser, DO   7 months ago Annual physical exam   [facility] Olin Hauser, DO   9 months ago Essential hypertension   [place], DO              Refused Prescriptions Disp Refills   TRULICITY 3 CW/2.3JS SOPN [Pharmacy Med Name: Trulicity 3 EG/3.1DV Subcutaneous Solution Pen-injector] 4 mL     Sig: INJECT 3 MG [place]     Endocrinology:  Diabetes - GLP-1 Receptor Agonists Failed - 11/15/2022  2:01 PM      Failed - HBA1C is between 0 and 7.9 and within 180 days    Hemoglobin A1C  Date Value Ref Range Status  07/14/2022 9.3 (A) 4.0 - 5.6 % Final   Hgb A1c MFr Bld  Date  Value Ref Range Status  03/31/2022 8.9 (H) <5.7 % of total Hgb Final    Comment:    For someone without known diabetes, a hemoglobin A1c value of 6.5% or greater indicates that they may have  diabetes and this should be confirmed with a follow-up  test. . For someone with known diabetes, a value <7% indicates  that their diabetes is well controlled and a value  greater than or equal to 7% indicates suboptimal  control. A1c targets should be individualized based on  duration of diabetes, age, comorbid conditions, and  other considerations. . Currently, no consensus exists regarding use of hemoglobin A1c for diagnosis of diabetes for children. Renella Cunas - Valid encounter within last 6 months    Recent Outpatient Visits           1 week ago COPD exacerbation [facility])   [facility] [place], Coralie Keens, NP   4 months ago Generalized weakness   [place], DO   5 months ago Acute bronchitis with COPD [facility])   [place]  [place], DO   7 months ago Annual physical exam   [facility] Olin Hauser, DO   9 months ago Essential hypertension   [place], Devonne Doughty, [place]

## 2022-11-16 NOTE — Telephone Encounter (Signed)
Requested Prescriptions  Pending Prescriptions Disp Refills   TRULICITY 3 YK/5.9DJ SOPN [Pharmacy Med Name: Trulicity 3 TT/0.1XB Subcutaneous Solution Pen-injector] 4 mL     Sig: INJECT 3 MG SUBCUTANEOUSLY ONCE A WEEK     Endocrinology:  Diabetes - GLP-1 Receptor Agonists Failed - 11/15/2022  2:01 PM      Failed - HBA1C is between 0 and 7.9 and within 180 days    Hemoglobin A1C  Date Value Ref Range Status  07/14/2022 9.3 (A) 4.0 - 5.6 % Final   Hgb A1c MFr Bld  Date Value Ref Range Status  03/31/2022 8.9 (H) <5.7 % of total Hgb Final    Comment:    For someone without known diabetes, a hemoglobin A1c value of 6.5% or greater indicates that they may have  diabetes and this should be confirmed with a follow-up  test. . For someone with known diabetes, a value <7% indicates  that their diabetes is well controlled and a value  greater than or equal to 7% indicates suboptimal  control. A1c targets should be individualized based on  duration of diabetes, age, comorbid conditions, and  other considerations. . Currently, no consensus exists regarding use of hemoglobin A1c for diagnosis of diabetes for children. Renella Cunas - Valid encounter within last 6 months    Recent Outpatient Visits           1 week ago COPD exacerbation ([place])   [facility] [place], Coralie Keens, NP   4 months ago Generalized weakness   [place], DO   5 months ago Acute bronchitis with COPD [facility])   [facility] Olin Hauser, DO   7 months ago Annual physical exam   [facility] Olin Hauser, DO   9 months ago Essential hypertension   [place], DO               albuterol (VENTOLIN HFA) 108 (90 Base) MCG/ACT inhaler [Pharmacy Med Name: Albuterol Sulfate HFA 108 (90 Base) MCG/ACT Inhalation Aerosol Solution] 7 each     Sig: INHALE 1 TO 2 PUFFS  BY MOUTH EVERY 4 HOURS AS NEEDED FOR WHEEZING FOR SHORTNESS OF BREATH     Pulmonology:  Beta Agonists 2 Failed - 11/15/2022  2:01 PM      Failed - Last BP in normal range    BP Readings from Last 1 Encounters:  11/04/22 (!) 152/84         Passed - Last Heart Rate in normal range    Pulse Readings from Last 1 Encounters:  11/04/22 85         Passed - Valid encounter within last 12 months    Recent Outpatient Visits           1 week ago COPD exacerbation ([place])   [facility] [place], Coralie Keens, NP   4 months ago Generalized weakness   [place], DO   5 months ago Acute bronchitis with COPD [facility])   [facility] Olin Hauser, DO   7 months ago Annual physical exam   [facility] Olin Hauser, DO   9 months ago Essential hypertension   [place], Devonne Doughty, [place]

## 2022-11-25 ENCOUNTER — Other Ambulatory Visit: Payer: Self-pay

## 2022-12-01 ENCOUNTER — Other Ambulatory Visit: Payer: Self-pay

## 2022-12-01 DIAGNOSIS — E1142 Type 2 diabetes mellitus with diabetic polyneuropathy: Secondary | ICD-10-CM

## 2022-12-01 MED ORDER — MOUNJARO 5 MG/0.5ML ~~LOC~~ SOAJ: SUBCUTANEOUS | 0 refills | Status: DC

## 2023-01-07 ENCOUNTER — Other Ambulatory Visit: Payer: Self-pay | Admitting: Family Medicine

## 2023-01-09 NOTE — Telephone Encounter (Signed)
Requested Prescriptions  Pending Prescriptions Disp Refills   insulin glargine (LANTUS) 100 UNIT/ML injection [Pharmacy Med Name: Lantus 100 UNIT/ML Subcutaneous Solution] 60 mL 0    Sig: INJECT [place] DAILY     Endocrinology:  Diabetes - Insulins Failed - 01/07/2023  6:41 AM      Failed - HBA1C is between 0 and 7.9 and within 180 days    Hemoglobin A1C  Date Value Ref Range Status  07/14/2022 9.3 (A) 4.0 - 5.6 % Final   Hgb A1c MFr Bld  Date Value Ref Range Status  03/31/2022 8.9 (H) <5.7 % of total Hgb Final    Comment:    For someone without known diabetes, a hemoglobin A1c value of 6.5% or greater indicates that they may have  diabetes and this should be confirmed with a follow-up  test. . For someone with known diabetes, a value <7% indicates  that their diabetes is well controlled and a value  greater than or equal to 7% indicates suboptimal  control. A1c targets should be individualized based on  duration of diabetes, age, comorbid conditions, and  other considerations. . Currently, no consensus exists regarding use of hemoglobin A1c for diagnosis of diabetes for children. Renella Cunas - Valid encounter within last 6 months    Recent Outpatient Visits           2 months ago COPD exacerbation [facility])   [facility] [place], Coralie Keens, NP   5 months ago Generalized weakness   [place], DO   6 months ago Acute bronchitis with COPD [facility])   [facility] Olin Hauser, DO   9 months ago Annual physical exam   [facility] Olin Hauser, DO   11 months ago Essential hypertension   [place], [place]

## 2023-01-12 ENCOUNTER — Encounter: Payer: Self-pay | Admitting: Family Medicine

## 2023-01-12 ENCOUNTER — Ambulatory Visit (INDEPENDENT_AMBULATORY_CARE_PROVIDER_SITE_OTHER): Payer: BC Managed Care – PPO | Admitting: Family Medicine

## 2023-01-12 VITALS — BP 148/68 | HR 88 | Temp 99.0°F | Ht 70.5 in | Wt 241.0 lb

## 2023-01-12 DIAGNOSIS — J209 Acute bronchitis, unspecified: Secondary | ICD-10-CM

## 2023-01-12 DIAGNOSIS — J440 Chronic obstructive pulmonary disease with acute lower respiratory infection: Secondary | ICD-10-CM

## 2023-01-12 LAB — POC COVID19 BINAXNOW: SARS Coronavirus 2 Ag: NEGATIVE

## 2023-01-12 LAB — POCT INFLUENZA A/B
Influenza A, POC: NEGATIVE
Influenza B, POC: NEGATIVE

## 2023-01-12 MED ORDER — AMOXICILLIN-POT CLAVULANATE 875-125 MG PO TABS: 1.0000 | ORAL_TABLET | Freq: Two times a day (BID) | ORAL | 0 refills | Status: DC

## 2023-01-12 MED ORDER — PREDNISONE 20 MG PO TABS: ORAL_TABLET | ORAL | 0 refills | Status: DC

## 2023-01-12 MED ORDER — HYDROCODONE BIT-HOMATROP MBR 5-1.5 MG/5ML PO SOLN: 5.0000 mL | Freq: Three times a day (TID) | ORAL | 0 refills | Status: DC | PRN

## 2023-01-12 NOTE — Patient Instructions (Addendum)
Thank you for coming to the office today.  COVID Flu test negative  Start course for COPD flare Start Augmentin Prednisone taper 10-12 days Hydrocodone cough syrup  Please schedule a Follow-up Appointment to: Return if symptoms worsen or fail to improve.  If you have any other questions or concerns, please feel free to call the office or send a message through [place]. You may also schedule an earlier appointment if necessary.  Additionally, you may be receiving a survey about your experience at our office within a few days to 1 week by e-mail or mail. We value your feedback.  [name], DO [place]

## 2023-01-12 NOTE — Progress Notes (Signed)
Subjective:    Patient ID: Henry Horne, male    DOB: [birth year], 72 y.o.   MRN: [id number]  Henry Horne is a 72 y.o. male presenting on 01/12/2023 for Cough and Nasal Congestion   HPI  Acute Flare / COPD Centrilobular Emphysema Recurrent Cough, bronchospasm Ataxia Dizziness - Resolved  Acute onset few days ago with viral URI symptoms [place]ls unsuccessful Worsening coughing spells with wheezing. Worse if lay flat at night.  Updated vaccines including RSV Denies fever chills body aches, nausea vomit    Has taken Mucinex without relief. Other OTC meds      07/14/2022    9:54 AM 06/16/2022   11:39 AM 04/08/2022   10:36 AM  Depression screen PHQ 2/9  Decreased Interest 1 1 0  Down, Depressed, Hopeless 1 1 1  $ PHQ - 2 Score 2 2 1  $ Altered sleeping 0 0 0  Tired, decreased energy 1 1 1  $ Change in appetite 1 1 0  Feeling bad or failure about yourself  0 0 0  Trouble concentrating 0 0 0  Moving slowly or fidgety/restless 1 1 0  Suicidal thoughts 0 0 0  PHQ-9 Score 5 5 2  $ Difficult doing work/chores Somewhat difficult Somewhat difficult Not difficult at all    Social History   Tobacco Use   Smoking status: Former    Packs/day: 1.00    Years: 30.00    Total pack years: 30.00    Types: Cigarettes    Quit date: 05/18/2004    Years since quitting: 18.6   Smokeless tobacco: Former  Scientific laboratory technician Use: Never used  Substance Use Topics   Alcohol use: No    Alcohol/week: 0.0 standard drinks of alcohol   Drug use: No    Review of Systems Per HPI unless specifically indicated above     Objective:    BP (!) 148/68   Pulse 88   Temp 99 F (37.2 C) (Oral)   Ht 5' 10.5" (1.791 m)   Wt 241 lb (109.3 kg)   SpO2 97%   BMI 34.09 kg/m   Wt Readings from Last 3 Encounters:  01/12/23 241 lb (109.3 kg)  11/04/22 241 lb (109.3 kg)  08/25/22 239 lb (108.4 kg)    Physical Exam Vitals and nursing note reviewed.  Constitutional:      General: He is not in  acute distress.    Appearance: Normal appearance. He is well-developed. He is not diaphoretic.     Comments: Well-appearing, comfortable, cooperative  HENT:     Head: Normocephalic and atraumatic.  Eyes:     General:        Right eye: No discharge.        Left eye: No discharge.     Conjunctiva/sclera: Conjunctivae normal.  Neck:     Thyroid: No thyromegaly.  Cardiovascular:     Rate and Rhythm: Normal rate and regular rhythm.     Pulses: Normal pulses.     Heart sounds: Normal heart sounds. No murmur heard. Pulmonary:     Effort: Pulmonary effort is normal. No respiratory distress.     Breath sounds: Wheezing present. No rales.  Musculoskeletal:        General: Normal range of motion.     Cervical back: Normal range of motion and neck supple.  Lymphadenopathy:     Cervical: No cervical adenopathy.  Skin:    General: Skin is warm and dry.     Findings: No erythema  or rash.  Neurological:     Mental Status: He is alert and oriented to person, place, and time. Mental status is at baseline.  Psychiatric:        Mood and Affect: Mood normal.        Behavior: Behavior normal.        Thought Content: Thought content normal.     Comments: Well groomed, good eye contact, normal speech and thoughts    Results for orders placed or performed in visit on 01/12/23  POC COVID-19  Result Value Ref Range   SARS Coronavirus 2 Ag Negative Negative  POCT Influenza A/B  Result Value Ref Range   Influenza A, POC Negative Negative   Influenza B, POC Negative Negative      Assessment & Plan:   Problem List Items Addressed This Visit   None Visit Diagnoses     Acute bronchitis with COPD ([place])    -  Primary   Relevant Medications   amoxicillin-clavulanate (AUGMENTIN) 875-125 MG tablet   predniSONE (DELTASONE) 20 MG tablet   HYDROcodone bit-homatropine (HYCODAN) 5-1.5 MG/5ML syrup   Other Relevant Orders   POC COVID-19 (Completed)   POCT Influenza A/B (Completed)        AECOPD SImilar to prior episode Has pulmonology team  COVID Flu test negative  Start course for COPD flare Start Augmentin Prednisone taper 10-12 days Hydrocodone cough syrup  Meds ordered this encounter  Medications   amoxicillin-clavulanate (AUGMENTIN) 875-125 MG tablet    Sig: Take 1 tablet by mouth 2 (two) times daily.    Dispense:  20 tablet    Refill:  0   predniSONE (DELTASONE) 20 MG tablet    Sig: Take 2 tablets daily (33m) for 4 days, take 1 tab daily (250m for 4 days, take half tab daily (1034mfor 4 days    Dispense:  14 tablet    Refill:  0   HYDROcodone bit-homatropine (HYCODAN) 5-1.5 MG/5ML syrup    Sig: Take 5 mLs by mouth every 8 (eight) hours as needed for cough.    Dispense:  120 mL    Refill:  0     Follow up plan: Return if symptoms worsen or fail to improve.    AleNobie PutnamO [facility] 01/12/2023, 9:41 AM

## 2023-02-25 ENCOUNTER — Other Ambulatory Visit: Payer: Self-pay | Admitting: Family Medicine

## 2023-02-25 DIAGNOSIS — R451 Restlessness and agitation: Secondary | ICD-10-CM

## 2023-02-27 NOTE — Telephone Encounter (Signed)
Requested Prescriptions  Pending Prescriptions Disp Refills   metFORMIN (GLUCOPHAGE) 1000 MG tablet [Pharmacy Med Name: metFORMIN HCl 1000 MG Oral Tablet] 180 tablet 0    Sig: TAKE 1 TABLET BY MOUTH TWICE DAILY WITH MEALS     Endocrinology:  Diabetes - Biguanides Failed - 02/25/2023  8:17 PM      Failed - HBA1C is between 0 and 7.9 and within 180 days    Hemoglobin A1C  Date Value Ref Range Status  07/14/2022 9.3 (A) 4.0 - 5.6 % Final   Hgb A1c MFr Bld  Date Value Ref Range Status  03/31/2022 8.9 (H) <5.7 % of total Hgb Final    Comment:    For someone without known diabetes, a hemoglobin A1c value of 6.5% or greater indicates that they may have  diabetes and this should be confirmed with a follow-up  test. . For someone with known diabetes, a value <7% indicates  that their diabetes is well controlled and a value  greater than or equal to 7% indicates suboptimal  control. A1c targets should be individualized based on  duration of diabetes, age, comorbid conditions, and  other considerations. . Currently, no consensus exists regarding use of hemoglobin A1c for diagnosis of diabetes for children. .          Failed - B12 Level in normal range and within 720 days    Vitamin B-12  Date Value Ref Range Status  06/13/2022 175 (L) 180 - 914 pg/mL Final    Comment:    (NOTE) This assay is not validated for testing neonatal or myeloproliferative syndrome specimens for Vitamin B12 levels. Performed at [facility], [place] [address], [place], [place] [address]          Passed - Cr in normal range and within 360 days    Creat  Date Value Ref Range Status  03/31/2022 1.05 0.70 - 1.28 mg/dL Final   Creatinine, Ser  Date Value Ref Range Status  06/12/2022 1.07 0.61 - 1.24 mg/dL Final         Passed - eGFR in normal range and within 360 days    GFR, Est African American  Date Value Ref Range Status  03/18/2021 104 > OR = 60 mL/min/1.85m2 Final   GFR, Est Non African  American  Date Value Ref Range Status  03/18/2021 90 > OR = 60 mL/min/1.55m2 Final   GFR, Estimated  Date Value Ref Range Status  06/12/2022 >60 >60 mL/min Final    Comment:    (NOTE) Calculated using the CKD-EPI Creatinine Equation (2021)    eGFR  Date Value Ref Range Status  03/31/2022 76 > OR = 60 mL/min/1.49m2 Final    Comment:    The eGFR is based on the CKD-EPI 2021 equation. To calculate  the new eGFR from a previous Creatinine or Cystatin C result, go to https://www.kidney.org/professionals/ kdoqi/gfr%5Fcalculator          Passed - Valid encounter within last 6 months    Recent Outpatient Visits           1 month ago Acute bronchitis with COPD [facility])   [place], DO   3 months ago COPD exacerbation [facility])   [facility], Coralie Keens, NP   7 months ago Generalized weakness   [place], DO   8 months ago Acute bronchitis with COPD ([place])   [facility], Devonne Doughty, DO   10 months ago Annual physical exam   [facility] [place], Devonne Doughty, DO              Passed - CBC within normal limits and completed in the last 12 months    WBC  Date Value Ref Range Status  06/12/2022 12.8 (H) 4.0 - 10.5 K/uL Final   RBC  Date Value Ref Range Status  06/12/2022 5.14 4.22 - 5.81 MIL/uL Final   Hemoglobin  Date Value Ref Range Status  06/12/2022 13.2 13.0 - 17.0 g/dL Final   HCT  Date Value Ref Range Status  06/12/2022 41.6 39.0 - 52.0 % Final   MCHC  Date Value Ref Range Status  06/12/2022 31.7 30.0 - 36.0 g/dL Final   [facility]  Date Value Ref Range Status  06/12/2022 25.7 (L) 26.0 - 34.0 pg Final   MCV  Date Value Ref Range Status  06/12/2022 80.9 80.0 - 100.0 fL Final   No results found for: "PLTCOUNTKUC", "LABPLAT", "POCPLA" RDW  Date Value Ref  Range Status  06/12/2022 13.3 11.5 - 15.5 % Final          FLUoxetine (PROZAC) 40 MG capsule [Pharmacy Med Name: FLUoxetine HCl 40 MG Oral Capsule] 90 capsule 1    Sig: Take 1 capsule by mouth once daily     Psychiatry:  Antidepressants - SSRI Passed - 02/25/2023  8:17 PM      Passed - Completed PHQ-2 or PHQ-9 in the last 360 days      Passed - Valid encounter within last 6 months    Recent Outpatient Visits           1 month ago Acute bronchitis with [facility])   [place], Alexander J, DO   3 months ago COPD exacerbation [facility])   [facility] [place], Coralie Keens, NP   7 months ago Generalized weakness   [place], DO   8 months ago Acute bronchitis with COPD [facility])   [place], DO   10 months ago Annual physical exam   [place], Devonne Doughty, [place]

## 2023-03-03 ENCOUNTER — Other Ambulatory Visit: Payer: Self-pay | Admitting: Family Medicine

## 2023-03-03 NOTE — Telephone Encounter (Signed)
Requested by interface surescripts. Medication discontinued 06/13/22.  Requested Prescriptions  Refused Prescriptions Disp Refills   Continuous Blood Gluc Sensor (FREESTYLE LIBRE 14 DAY SENSOR) MISC [Pharmacy Med Name: FREESTYLE LIBRE SENSOR 14D KIT] 2 each 0    Sig: USE TO CHECK BLOOD SUGAR AS NEEDED - REPLACE EVERY 14 DAYS     Endocrinology: Diabetes - Testing Supplies Passed - 03/03/2023  6:43 AM      Passed - Valid encounter within last 12 months    Recent Outpatient Visits           1 month ago Acute bronchitis with COPD [facility])   [place] [facility] [place], Netta Neat, DO   3 months ago COPD exacerbation [facility])   [place] [facility], Salvadore Oxford, NP   7 months ago Generalized weakness   [place] [facility] Smitty Cords, DO   8 months ago Acute bronchitis with COPD [facility])   [place] Smitty Cords, DO   10 months ago Annual physical exam   [facility] [place], Netta Neat, [place]

## 2023-03-06 ENCOUNTER — Other Ambulatory Visit: Payer: Self-pay | Admitting: Family Medicine

## 2023-03-06 DIAGNOSIS — I10 Essential (primary) hypertension: Secondary | ICD-10-CM

## 2023-03-07 NOTE — Telephone Encounter (Signed)
Requested Prescriptions  Pending Prescriptions Disp Refills   amLODipine (NORVASC) 5 MG tablet [Pharmacy Med Name: amLODIPine Besylate 5 MG Oral Tablet] 90 tablet 0    Sig: Take 1 tablet by mouth once daily     Cardiovascular: Calcium Channel Blockers 2 Failed - 03/06/2023  6:42 AM      Failed - Last BP in normal range    BP Readings from Last 1 Encounters:  01/12/23 (!) 148/68         Passed - Last Heart Rate in normal range    Pulse Readings from Last 1 Encounters:  01/12/23 88         Passed - Valid encounter within last 6 months    Recent Outpatient Visits           1 month ago Acute bronchitis with COPD [facility])   [place] [facility] [place], Netta Neat, DO   4 months ago COPD exacerbation [facility])   [place] [facility] [place], Salvadore Oxford, NP   7 months ago Generalized weakness   [place] [facility] Smitty Cords, DO   8 months ago Acute bronchitis with COPD [facility])   [place] [facility] Smitty Cords, DO   11 months ago Annual physical exam   [place] [facility] [place], Netta Neat, [place]

## 2023-03-27 ENCOUNTER — Encounter: Payer: Self-pay | Admitting: Family Medicine

## 2023-03-27 ENCOUNTER — Ambulatory Visit (INDEPENDENT_AMBULATORY_CARE_PROVIDER_SITE_OTHER): Payer: BC Managed Care – PPO | Admitting: Family Medicine

## 2023-03-27 VITALS — BP 148/68 | HR 97 | Temp 98.2°F | Ht 70.5 in | Wt 236.0 lb

## 2023-03-27 DIAGNOSIS — J209 Acute bronchitis, unspecified: Secondary | ICD-10-CM

## 2023-03-27 DIAGNOSIS — J440 Chronic obstructive pulmonary disease with acute lower respiratory infection: Secondary | ICD-10-CM | POA: Diagnosis not present

## 2023-03-27 DIAGNOSIS — E1142 Type 2 diabetes mellitus with diabetic polyneuropathy: Secondary | ICD-10-CM | POA: Diagnosis not present

## 2023-03-27 MED ORDER — LANTUS SOLOSTAR 100 UNIT/ML ~~LOC~~ SOPN: 70.0000 [IU] | PEN_INJECTOR | Freq: Every day | SUBCUTANEOUS | 5 refills | Status: DC

## 2023-03-27 MED ORDER — PREDNISONE 20 MG PO TABS: ORAL_TABLET | ORAL | 0 refills | Status: DC

## 2023-03-27 MED ORDER — BENZONATATE 200 MG PO CAPS: 200.0000 mg | ORAL_CAPSULE | Freq: Three times a day (TID) | ORAL | 0 refills | Status: DC | PRN

## 2023-03-27 MED ORDER — HYDROCODONE BIT-HOMATROP MBR 5-1.5 MG/5ML PO SOLN: 5.0000 mL | Freq: Three times a day (TID) | ORAL | 0 refills | Status: DC | PRN

## 2023-03-27 MED ORDER — AMOXICILLIN-POT CLAVULANATE 875-125 MG PO TABS: 1.0000 | ORAL_TABLET | Freq: Two times a day (BID) | ORAL | 0 refills | Status: DC

## 2023-03-27 NOTE — Patient Instructions (Addendum)
Thank you for coming to the office today.  Likely viral URI or sinusitis, now with wheezing concern for COPD Flare similar to last time.  Start Augmentin antibiotic course  Start Tessalon Perls take 1 capsule up to 3 times a day as needed for cough  Cough syrup w codeine, caution with tramadol  Ordered Prednisone as a back up plan only if not improving. Caution blood sugar.,  Trial Lantus pen instead of vial, 70 units, ordered 2 boxes.   Please schedule a Follow-up Appointment to: Return if symptoms worsen or fail to improve.  If you have any other questions or concerns, please feel free to call the office or send a message through MyChart. You may also schedule an earlier appointment if necessary.  Additionally, you may be receiving a survey about your experience at our office within a few days to 1 week by e-mail or mail. We value your feedback.  Saralyn Pilar, DO [facility], [place]

## 2023-03-27 NOTE — Progress Notes (Addendum)
Subjective:    Patient ID: Henry Horne, male    DOB: [birth year], 72 y.o.   MRN: [id number]  Henry Horne is a 72 y.o. male presenting on 03/27/2023 for URI   HPI  Acute Flare / COPD Centrilobular Emphysema Recurrent Cough, bronchospasm Acute onset few days ago with viral URI symptoms Worsening coughing spells with wheezing. Worse if lay flat at night. Similar to before but now catching it early with coughing spells Denies fever or chills  T2 Diabetes Needs lantus pen instead of vial     07/14/2022    9:54 AM 06/16/2022   11:39 AM 04/08/2022   10:36 AM  Depression screen PHQ 2/9  Decreased Interest 1 1 0  Down, Depressed, Hopeless 1 1 1   PHQ - 2 Score 2 2 1   Altered sleeping 0 0 0  Tired, decreased energy 1 1 1   Change in appetite 1 1 0  Feeling bad or failure about yourself  0 0 0  Trouble concentrating 0 0 0  Moving slowly or fidgety/restless 1 1 0  Suicidal thoughts 0 0 0  PHQ-9 Score 5 5 2   Difficult doing work/chores Somewhat difficult Somewhat difficult Not difficult at all    Social History   Tobacco Use   Smoking status: Former    Packs/day: 1.00    Years: 30.00    Additional pack years: 0.00    Total pack years: 30.00    Types: Cigarettes    Quit date: 05/18/2004    Years since quitting: 18.8   Smokeless tobacco: Former  Building services engineer Use: Never used  Substance Use Topics   Alcohol use: No    Alcohol/week: 0.0 standard drinks of alcohol   Drug use: No    Review of Systems Per HPI unless specifically indicated above     Objective:    BP (!) 148/68 (BP Location: Left Arm, Cuff Size: Normal)   Pulse 97   Temp 98.2 F (36.8 C) (Oral)   Ht 5' 10.5" (1.791 m)   Wt 236 lb (107 kg)   SpO2 95%   BMI 33.38 kg/m   Wt Readings from Last 3 Encounters:  03/27/23 236 lb (107 kg)  01/12/23 241 lb (109.3 kg)  11/04/22 241 lb (109.3 kg)    Physical Exam Vitals and nursing note reviewed.  Constitutional:      General: He is not in  acute distress.    Appearance: He is well-developed. He is not diaphoretic.     Comments: Well-appearing, comfortable, cooperative  HENT:     Head: Normocephalic and atraumatic.  Eyes:     General:        Right eye: No discharge.        Left eye: No discharge.     Conjunctiva/sclera: Conjunctivae normal.  Neck:     Thyroid: No thyromegaly.  Cardiovascular:     Rate and Rhythm: Normal rate and regular rhythm.     Pulses: Normal pulses.     Heart sounds: Normal heart sounds. No murmur heard. Pulmonary:     Effort: Pulmonary effort is normal. No respiratory distress.     Breath sounds: Wheezing present. No rales.     Comments: coughing Musculoskeletal:        General: Normal range of motion.     Cervical back: Normal range of motion and neck supple.  Lymphadenopathy:     Cervical: No cervical adenopathy.  Skin:    General: Skin is warm and dry.  Findings: No erythema or rash.  Neurological:     Mental Status: He is alert and oriented to person, place, and time. Mental status is at baseline.  Psychiatric:        Behavior: Behavior normal.     Comments: Well groomed, good eye contact, normal speech and thoughts    Results for orders placed or performed in visit on 01/12/23  POC COVID-19  Result Value Ref Range   SARS Coronavirus 2 Ag Negative Negative  POCT Influenza A/B  Result Value Ref Range   Influenza A, POC Negative Negative   Influenza B, POC Negative Negative      Assessment & Plan:   Problem List Items Addressed This Visit     Type 2 diabetes mellitus with peripheral neuropathy (HCC)   Relevant Medications   LANTUS SOLOSTAR 100 UNIT/ML Solostar Pen   Other Visit Diagnoses     Acute bronchitis with COPD (HCC)    -  Primary   Relevant Medications   amoxicillin-clavulanate (AUGMENTIN) 875-125 MG tablet   benzonatate (TESSALON) 200 MG capsule   predniSONE (DELTASONE) 20 MG tablet   HYDROcodone bit-homatropine (HYCODAN) 5-1.5 MG/5ML syrup      Likely  viral URI or sinusitis, now with wheezing concern for COPD Flare similar to last time.  Start Augmentin antibiotic course  Start Tessalon Perls take 1 capsule up to 3 times a day as needed for cough  Cough syrup w codeine, caution with tramadol  Ordered Prednisone as a back up plan only if not improving. Caution blood sugar.,  Trial Lantus pen instead of vial, 70 units, ordered 2 boxes.   Meds ordered this encounter  Medications   amoxicillin-clavulanate (AUGMENTIN) 875-125 MG tablet    Sig: Take 1 tablet by mouth 2 (two) times daily.    Dispense:  20 tablet    Refill:  0   benzonatate (TESSALON) 200 MG capsule    Sig: Take 1 capsule (200 mg total) by mouth 3 (three) times daily as needed for cough.    Dispense:  30 capsule    Refill:  0   predniSONE (DELTASONE) 20 MG tablet    Sig: Take 2 tablets daily (40mg ) for 4 days, take 1 tab daily (20mg ) for 4 days, take half tab daily (10mg ) for 4 days    Dispense:  14 tablet    Refill:  0   HYDROcodone bit-homatropine (HYCODAN) 5-1.5 MG/5ML syrup    Sig: Take 5 mLs by mouth every 8 (eight) hours as needed for cough.    Dispense:  120 mL    Refill:  0   LANTUS SOLOSTAR 100 UNIT/ML Solostar Pen    Sig: Inject 70 Units into the skin daily.    Dispense:  30 mL    Refill:  5      Follow up plan: Return if symptoms worsen or fail to improve.  Saralyn Pilar, DO [facility] 03/27/2023, 8:32 AM

## 2023-03-29 ENCOUNTER — Other Ambulatory Visit: Payer: Self-pay | Admitting: Family Medicine

## 2023-03-29 NOTE — Telephone Encounter (Signed)
Rx discontinued and Solostar pen called in 03/27/23 Requested Prescriptions  Pending Prescriptions Disp Refills   insulin glargine (LANTUS) 100 UNIT/ML injection [Pharmacy Med Name: Lantus 100 UNIT/ML Subcutaneous Solution] 60 mL 0    Sig: INJECT 70 UNITS SUBCUTANEOUSLY ONCE DAILY     Endocrinology:  Diabetes - Insulins Failed - 03/29/2023  6:42 AM      Failed - HBA1C is between 0 and 7.9 and within 180 days    Hemoglobin A1C  Date Value Ref Range Status  07/14/2022 9.3 (A) 4.0 - 5.6 % Final   Hgb A1c MFr Bld  Date Value Ref Range Status  03/31/2022 8.9 (H) <5.7 % of total Hgb Final    Comment:    For someone without known diabetes, a hemoglobin A1c value of 6.5% or greater indicates that they may have  diabetes and this should be confirmed with a follow-up  test. . For someone with known diabetes, a value <7% indicates  that their diabetes is well controlled and a value  greater than or equal to 7% indicates suboptimal  control. A1c targets should be individualized based on  duration of diabetes, age, comorbid conditions, and  other considerations. . Currently, no consensus exists regarding use of hemoglobin A1c for diagnosis of diabetes for children. Verna Czech - Valid encounter within last 6 months    Recent Outpatient Visits           2 days ago Acute bronchitis with COPD [facility])   [place] [facility] [place], Netta Neat, DO   2 months ago Acute bronchitis with COPD [facility])   [place] [facility] Smitty Cords, DO   4 months ago COPD exacerbation [facility])   [place] [facility] [place], Salvadore Oxford, NP   8 months ago Generalized weakness   [place] Smitty Cords, DO   9 months ago Acute bronchitis with COPD [place])   [place] [facility] [place], Netta Neat, [place]

## 2023-03-30 DIAGNOSIS — Z8546 Personal history of malignant neoplasm of prostate: Secondary | ICD-10-CM | POA: Diagnosis not present

## 2023-04-17 ENCOUNTER — Other Ambulatory Visit: Payer: Self-pay | Admitting: Family Medicine

## 2023-04-17 DIAGNOSIS — E1142 Type 2 diabetes mellitus with diabetic polyneuropathy: Secondary | ICD-10-CM

## 2023-04-18 NOTE — Telephone Encounter (Signed)
Requested medication (s) are due for refill today -yes  Requested medication (s) are on the active medication list -yes  Future visit scheduled -no  Last refill: 12/01/22 6ml  Notes to clinic: off protocol- provider review   Requested Prescriptions  Pending Prescriptions Disp Refills   MOUNJARO 5 MG/0.5ML Pen [Pharmacy Med Name: Mounjaro 5 MG/0.5ML Subcutaneous Solution Pen-injector] 12 mL 0    Sig: INJECT 5 MG  SUBCUTANEOUSLY ONCE A WEEK     Off-Protocol Failed - 04/17/2023 10:19 AM      Failed - Medication not assigned to a protocol, review manually.      Passed - Valid encounter within last 12 months    Recent Outpatient Visits           3 weeks ago Acute bronchitis with COPD [facility])   [place] [facility] [place], Netta Neat, DO   3 months ago Acute bronchitis with COPD [facility])   [place] [facility] Smitty Cords, DO   5 months ago COPD exacerbation [facility])   [place] [facility] [place], Salvadore Oxford, NP   9 months ago Generalized weakness   [place] [facility] Smitty Cords, DO   10 months ago Acute bronchitis with COPD [facility])   [place] [facility] Smitty Cords, DO                 Requested Prescriptions  Pending Prescriptions Disp Refills   MOUNJARO 5 MG/0.5ML Pen [Pharmacy Med Name: Greggory Keen 5 MG/0.5ML Subcutaneous Solution Pen-injector] 12 mL 0    Sig: INJECT 5 MG  SUBCUTANEOUSLY ONCE A WEEK     Off-Protocol Failed - 04/17/2023 10:19 AM      Failed - Medication not assigned to a protocol, review manually.      Passed - Valid encounter within last 12 months    Recent Outpatient Visits           3 weeks ago Acute bronchitis with [facility])   [facility] [place], Netta Neat, DO   3 months ago Acute bronchitis with COPD [facility])   [place] Smitty Cords, DO   5  months ago COPD exacerbation [facility])   [place] [facility] [place], Salvadore Oxford, NP   9 months ago Generalized weakness   [place] [facility] Smitty Cords, DO   10 months ago Acute bronchitis with COPD [facility])   [place] [facility] [place], Netta Neat, [place]

## 2023-05-06 ENCOUNTER — Other Ambulatory Visit: Payer: Self-pay | Admitting: Family Medicine

## 2023-05-06 DIAGNOSIS — M159 Polyosteoarthritis, unspecified: Secondary | ICD-10-CM

## 2023-05-08 NOTE — Telephone Encounter (Signed)
Requested medication (s) are due for refill today: yes  Requested medication (s) are on the active medication list: yes  Last refill:  11/03/22  Future visit scheduled: no  Notes to clinic:  Unable to refill per protocol, cannot delegate.      Requested Prescriptions  Pending Prescriptions Disp Refills   traMADol (ULTRAM) 50 MG tablet [Pharmacy Med Name: traMADol HCl 50 MG Oral Tablet] 21 tablet 0    Sig: TAKE 1 TABLET BY MOUTH EVERY 8 HOURS AS NEEDED     Not Delegated - Analgesics:  Opioid Agonists Failed - 05/06/2023 10:29 AM      Failed - This refill cannot be delegated      Passed - Urine Drug Screen completed in last 360 days      Passed - Valid encounter within last 3 months    Recent Outpatient Visits           1 month ago Acute bronchitis with [facility])   [place] [facility] [place], Netta Neat, DO   3 months ago Acute bronchitis with COPD [facility])   [place] Smitty Cords, DO   6 months ago COPD exacerbation [facility])   [place] [facility] [place], Salvadore Oxford, NP   9 months ago Generalized weakness   [place] [facility] Smitty Cords, DO   10 months ago Acute bronchitis with COPD [facility])   [place] [facility] Smitty Cords, DO       Future Appointments             In 2 weeks Raechel Chute, MD [facility]

## 2023-05-14 ENCOUNTER — Other Ambulatory Visit: Payer: Self-pay | Admitting: Family Medicine

## 2023-05-14 DIAGNOSIS — I10 Essential (primary) hypertension: Secondary | ICD-10-CM

## 2023-05-15 NOTE — Telephone Encounter (Signed)
Requested Prescriptions  Pending Prescriptions Disp Refills   losartan (COZAAR) 50 MG tablet [Pharmacy Med Name: Losartan Potassium 50 MG Oral Tablet] 90 tablet 0    Sig: Take 1 tablet by mouth once daily     Cardiovascular:  Angiotensin Receptor Blockers Failed - 05/14/2023  7:42 AM      Failed - Cr in normal range and within 180 days    Creat  Date Value Ref Range Status  03/31/2022 1.05 0.70 - 1.28 mg/dL Final   Creatinine, Ser  Date Value Ref Range Status  06/12/2022 1.07 0.61 - 1.24 mg/dL Final         Failed - K in normal range and within 180 days    Potassium  Date Value Ref Range Status  06/12/2022 3.7 3.5 - 5.1 mmol/L Final         Failed - Last BP in normal range    BP Readings from Last 1 Encounters:  03/27/23 (!) 148/68         Passed - Patient is not pregnant      Passed - Valid encounter within last 6 months    Recent Outpatient Visits           1 month ago Acute bronchitis with COPD [facility])   [place] [facility] Smitty Cords, DO   4 months ago Acute bronchitis with COPD [facility])   [place] [facility] Smitty Cords, DO   6 months ago COPD exacerbation [facility])   [place] [facility] [place], Salvadore Oxford, NP   10 months ago Generalized weakness   [place] [facility] Smitty Cords, DO   11 months ago Acute bronchitis with COPD [facility])   [place] [facility] Smitty Cords, DO       Future Appointments             In 1 week Raechel Chute, MD [facility] at [facility]

## 2023-05-26 ENCOUNTER — Ambulatory Visit: Payer: BC Managed Care – PPO | Admitting: Student in an Organized Health Care Education/Training Program

## 2023-05-30 ENCOUNTER — Other Ambulatory Visit: Payer: Self-pay | Admitting: Family Medicine

## 2023-05-31 NOTE — Telephone Encounter (Signed)
D/C 06/13/22. Requested Prescriptions  Refused Prescriptions Disp Refills   Continuous Glucose Sensor (FREESTYLE LIBRE 14 DAY SENSOR) MISC [Pharmacy Med Name: FREESTYLE LIBRE SENSOR 14D KIT] 2 each 0    Sig: USE TO CHECK BLOOD SUGAR AS NEEDED - REPLACE EVERY 14 DAYS     Endocrinology: Diabetes - Testing Supplies Passed - 05/30/2023  4:26 PM      Passed - Valid encounter within last 12 months    Recent Outpatient Visits           2 months ago Acute bronchitis with COPD [facility])   [place] [facility] [place], Netta Neat, DO   4 months ago Acute bronchitis with COPD [facility])   [place] [facility] Smitty Cords, DO   6 months ago COPD exacerbation [facility])   [place] [facility] [place], Salvadore Oxford, NP   10 months ago Generalized weakness   [facility] Smit[facility] Cords, DO   11 months ago Acute bronchitis with COPD [facility])    [place] [facility] [place], Netta Neat, [place]

## 2023-06-05 ENCOUNTER — Other Ambulatory Visit: Payer: Self-pay | Admitting: Family Medicine

## 2023-06-05 DIAGNOSIS — I10 Essential (primary) hypertension: Secondary | ICD-10-CM

## 2023-06-05 NOTE — Telephone Encounter (Signed)
Requested Prescriptions  Pending Prescriptions Disp Refills   amLODipine (NORVASC) 5 MG tablet [Pharmacy Med Name: amLODIPine Besylate 5 MG Oral Tablet] 90 tablet 1    Sig: Take 1 tablet by mouth once daily     Cardiovascular: Calcium Channel Blockers 2 Failed - 06/05/2023  6:42 AM      Failed - Last BP in normal range    BP Readings from Last 1 Encounters:  03/27/23 (!) 148/68         Passed - Last Heart Rate in normal range    Pulse Readings from Last 1 Encounters:  03/27/23 97         Passed - Valid encounter within last 6 months    Recent Outpatient Visits           2 months ago Acute bronchitis with COPD [facility])   [place] [facility] [place], Netta Neat, DO   4 months ago Acute bronchitis with COPD [facility])   [place] [facility] [place] Smitty Cords, DO   7 months ago COPD exacerbation [facility])   [place] [facility] [place], Salvadore Oxford, NP   10 months ago Generalized weakness   [place] [facility] Smitty Cords, DO   11 months ago Acute bronchitis with COPD [facility])   [place] [facility] [place], Netta Neat, [place]

## 2023-06-12 ENCOUNTER — Other Ambulatory Visit: Payer: Self-pay | Admitting: Family Medicine

## 2023-06-13 ENCOUNTER — Telehealth: Payer: Self-pay | Admitting: Family Medicine

## 2023-06-13 DIAGNOSIS — E1142 Type 2 diabetes mellitus with diabetic polyneuropathy: Secondary | ICD-10-CM

## 2023-06-13 NOTE — Telephone Encounter (Signed)
Discontinued 06/13/22, will refuse this request.  Requested Prescriptions  Pending Prescriptions Disp Refills   Continuous Glucose Sensor (FREESTYLE LIBRE 14 DAY SENSOR) MISC [Pharmacy Med Name: FREESTYLE LIBRE SENSOR 14D KIT] 2 each 0    Sig: USE TO CHECK BLOOD SUGAR AS NEEDED - REPLACE EVERY 14 DAYS     Endocrinology: Diabetes - Testing Supplies Passed - 06/12/2023  1:37 PM      Passed - Valid encounter within last 12 months    Recent Outpatient Visits           2 months ago Acute bronchitis with COPD [facility])   [place] [facility] [place], Netta Neat, DO   5 months ago Acute bronchitis with COPD [facility])   [place] [facility] Smitty Cords, DO   7 months ago COPD exacerbation [facility])   [place] [facility] [place], Salvadore Oxford, NP   11 months ago Generalized weakness   [place] [facility] Smitty Cords, DO   12 months ago Acute bronchitis with COPD [facility])   [place] [facility] [place], Netta Neat, [place]

## 2023-06-13 NOTE — Telephone Encounter (Signed)
Pt is calling to check on the status of a refill for Continuous Glucose Sensor (FREESTYLE LIBRE 14 DAY SENSOR) MISC [Pharmacy Med Name: [place] 14D KIT] [[id number]]  , Pharmacy says there is nothing there and pt would like a status update.

## 2023-06-14 ENCOUNTER — Telehealth: Payer: Self-pay | Admitting: Family Medicine

## 2023-06-14 MED ORDER — FREESTYLE LIBRE 14 DAY SENSOR MISC
5 refills | Status: DC
Start: 2023-06-14 — End: 2024-05-16

## 2023-06-14 NOTE — Telephone Encounter (Signed)
See previous TE for more info but patient called to say he is willing to get a new meter and all the sensors that go with it as the one he was requesting is no longer on the market. Please f/u with the patient with any changes

## 2023-06-14 NOTE — Telephone Encounter (Signed)
Called patient to confirm.  Re ordered Freestyle Libre for 28 day supply, as it was a quantity issue it seems  Saralyn Pilar, DO [facility] 06/14/2023, 6:11 PM

## 2023-07-02 ENCOUNTER — Other Ambulatory Visit: Payer: Self-pay | Admitting: Student in an Organized Health Care Education/Training Program

## 2023-07-02 DIAGNOSIS — R052 Subacute cough: Secondary | ICD-10-CM

## 2023-07-24 ENCOUNTER — Other Ambulatory Visit: Payer: Self-pay | Admitting: Family Medicine

## 2023-07-24 DIAGNOSIS — M159 Polyosteoarthritis, unspecified: Secondary | ICD-10-CM

## 2023-07-25 NOTE — Telephone Encounter (Signed)
Requested medications are due for refill today.  yes  Requested medications are on the active medications list.  yes  Last refill. 05/08/2023 #30 2 rf  Future visit scheduled.   no  Notes to clinic.  Refill not delegated.    Requested Prescriptions  Pending Prescriptions Disp Refills   traMADol (ULTRAM) 50 MG tablet [Pharmacy Med Name: traMADol HCl 50 MG Oral Tablet] 30 tablet 0    Sig: TAKE 1 TABLET BY MOUTH EVERY 8 HOURS AS NEEDED     Not Delegated - Analgesics:  Opioid Agonists Failed - 07/24/2023  8:10 PM      Failed - This refill cannot be delegated      Failed - Urine Drug Screen completed in last 360 days      Failed - Valid encounter within last 3 months    Recent Outpatient Visits           4 months ago Acute bronchitis with COPD [facility])   [place] [facility] [place], Netta Neat, DO   6 months ago Acute bronchitis with COPD [facility])   [place] [facility] Smitty Cords, DO   8 months ago COPD exacerbation [facility])   [place] [facility] [place], Salvadore Oxford, NP   1 year ago Generalized weakness   [place] [facility] Smitty Cords, DO   1 year ago Acute bronchitis with COPD [facility])   [place] [facility] [place], Netta Neat, [place]

## 2023-08-11 ENCOUNTER — Other Ambulatory Visit: Payer: Self-pay | Admitting: Family Medicine

## 2023-08-13 ENCOUNTER — Other Ambulatory Visit: Payer: Self-pay | Admitting: Family Medicine

## 2023-08-13 DIAGNOSIS — I10 Essential (primary) hypertension: Secondary | ICD-10-CM

## 2023-08-14 NOTE — Telephone Encounter (Signed)
Requested medications are due for refill today.  unsure  Requested medications are on the active medications list.  yes  Last refill. 11/28/2021   Future visit scheduled.   no  Notes to clinic.  Medication is historical.    Requested Prescriptions  Pending Prescriptions Disp Refills   imipramine (TOFRANIL) 10 MG tablet [Pharmacy Med Name: Imipramine HCl 10 MG Oral Tablet] 90 tablet 0    Sig: TAKE 1 TABLET BY MOUTH AT BEDTIME     Psychiatry:  Antidepressants - Heterocyclics (TCAs) Passed - 08/11/2023  6:43 PM      Passed - Completed PHQ-2 or PHQ-9 in the last 360 days      Passed - Valid encounter within last 6 months    Recent Outpatient Visits           4 months ago Acute bronchitis with COPD [facility])   [place] [facility] [place], Netta Neat, DO   7 months ago Acute bronchitis with COPD [facility])   [place] [facility] Smitty Cords, DO   9 months ago COPD exacerbation [facility])   [place] [facility] [place], Salvadore Oxford, NP   1 year ago Generalized weakness   [place] [facility] Smitty Cords, DO   1 year ago Acute bronchitis with COPD [facility])   [place] [facility] [place], Netta Neat, [place]

## 2023-08-21 ENCOUNTER — Other Ambulatory Visit: Payer: Self-pay | Admitting: Family Medicine

## 2023-08-26 ENCOUNTER — Other Ambulatory Visit: Payer: Self-pay | Admitting: Family Medicine

## 2023-08-26 DIAGNOSIS — R451 Restlessness and agitation: Secondary | ICD-10-CM

## 2023-08-28 NOTE — Telephone Encounter (Signed)
Requested Prescriptions  Pending Prescriptions Disp Refills   FLUoxetine (PROZAC) 40 MG capsule [Pharmacy Med Name: FLUoxetine HCl 40 MG Oral Capsule] 90 capsule 0    Sig: Take 1 capsule by mouth once daily     Psychiatry:  Antidepressants - SSRI Passed - 08/26/2023  7:45 PM      Passed - Completed PHQ-2 or PHQ-9 in the last 360 days      Passed - Valid encounter within last 6 months    Recent Outpatient Visits           5 months ago Acute bronchitis with COPD [facility])   [place] [facility] [place], Netta Neat, DO   7 months ago Acute bronchitis with COPD [facility])   [place] [facility] Smitty Cords, DO   9 months ago COPD exacerbation [facility])   [place] [facility] [place], Salvadore Oxford, NP   1 year ago Generalized weakness   [place] [facility] Smitty Cords, DO   1 year ago Acute bronchitis with COPD [facility])   [place] [facility] [place], Netta Neat, [place]

## 2023-09-09 IMAGING — CT CT HEAD W/O CM
4 series · 16 of 47 positions shown, 18 images · non-contrast
Comparison: Head CT 01/30/2022.

CLINICAL DATA: 70-year-old male status post fall with small volume
para falcine subdural blood yesterday.



[Series 2: head wo · axial · 0.48mm/px · z∈[-103,+22]mm · 7 of 35 slices shown, 9 images]
[im 5/35  brain]
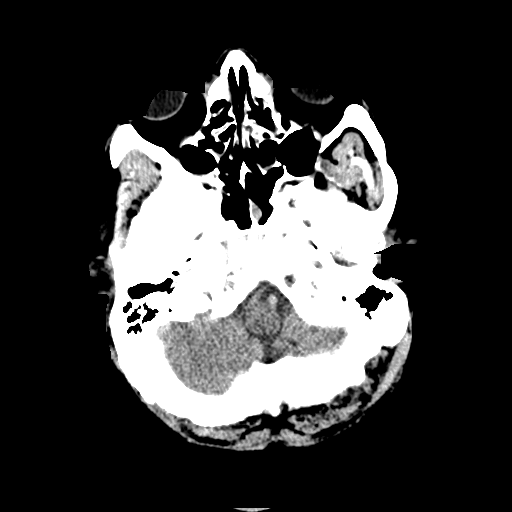
[im 5/35  bone]
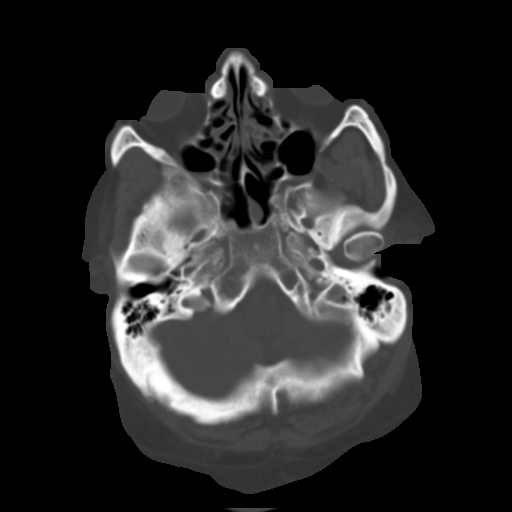
[im 9/35  brain]
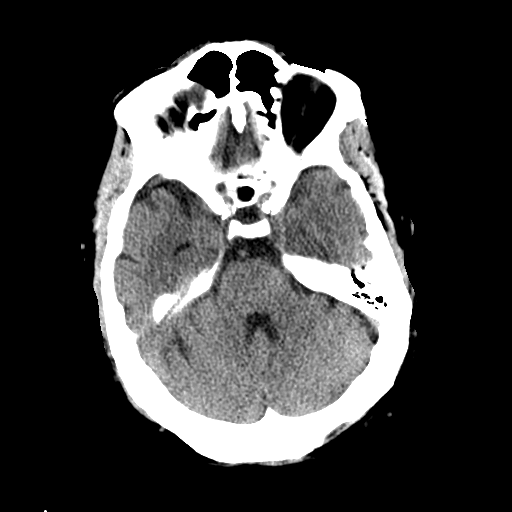
[im 13/35  brain]
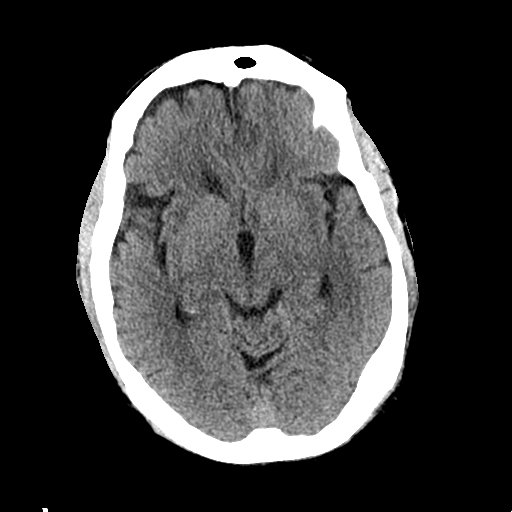
[im 18/35  brain]
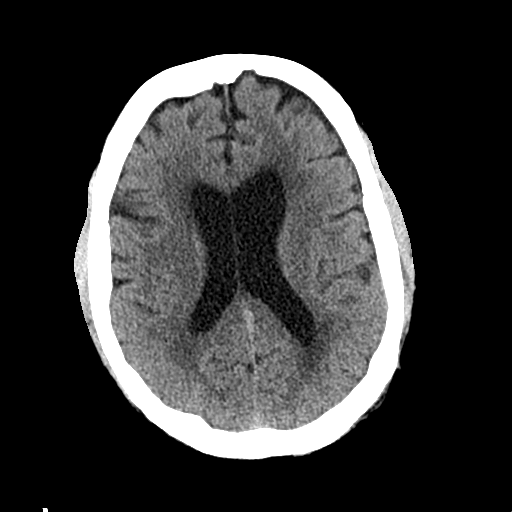
[im 22/35  brain]
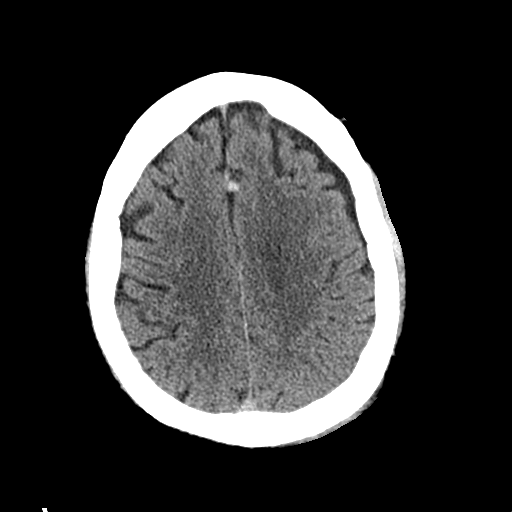
[im 22/35  bone]
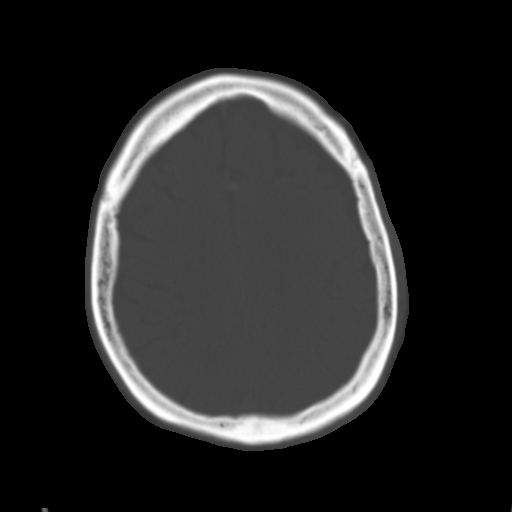
[im 26/35  brain]
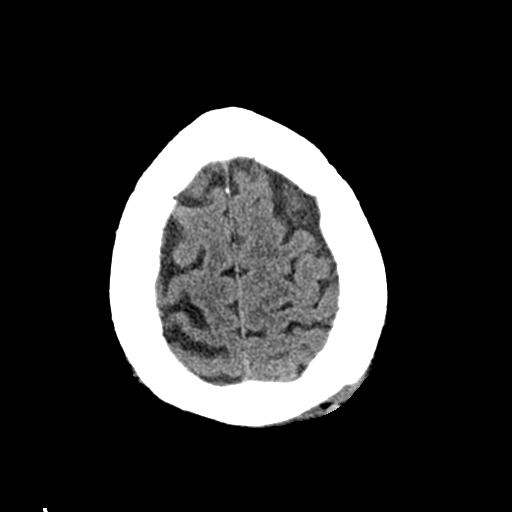
[im 30/35  brain]
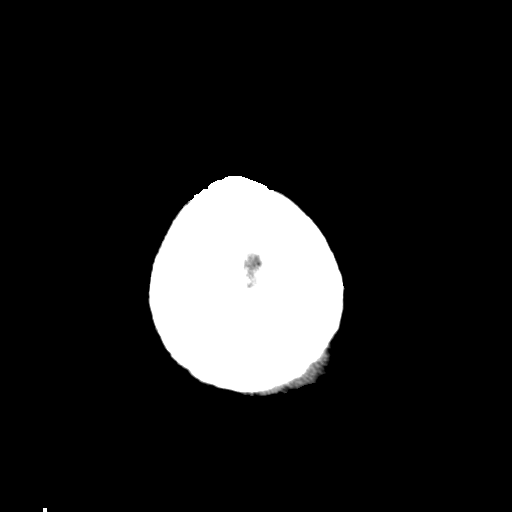

[Series 3: head bone · axial · 0.48mm/px · z∈[-107,-73]mm · 3 of 87 slices shown]
[im 9/87  bone]
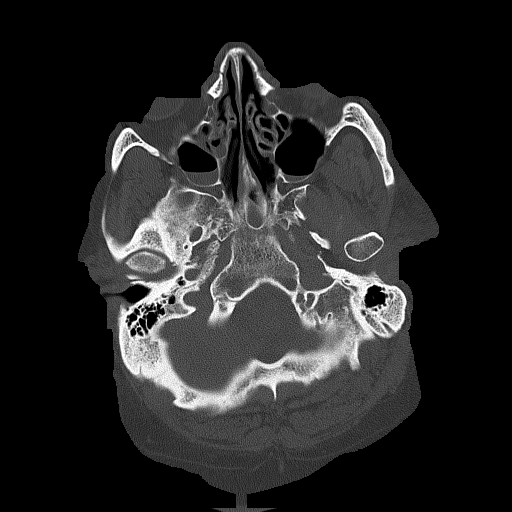
[im 18/87  bone]
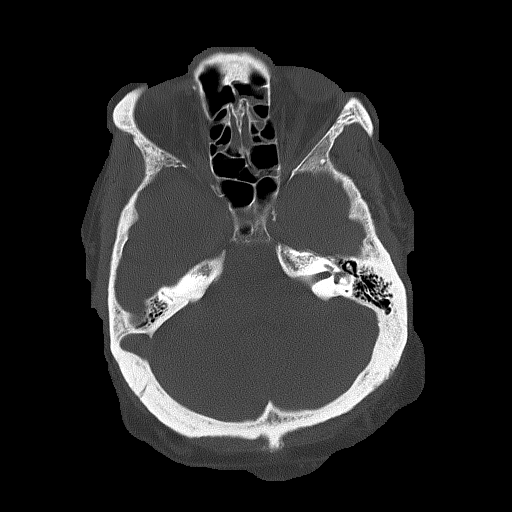
[im 26/87  bone]
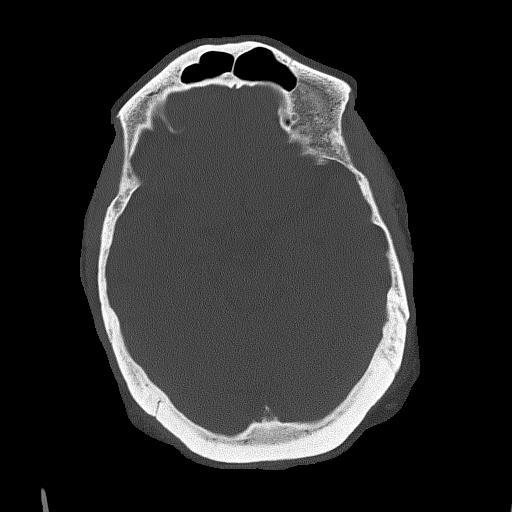

[Series 4: coronal soft tissue · coronal · 0.35mm/px · 3 of 74 slices shown]
[im 25/74  brain]
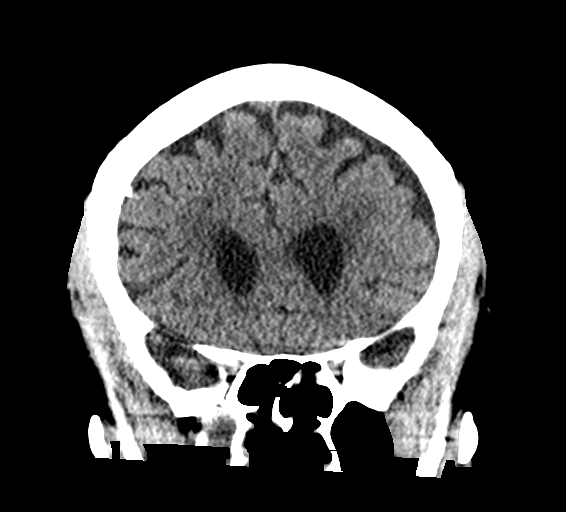
[im 33/74  brain]
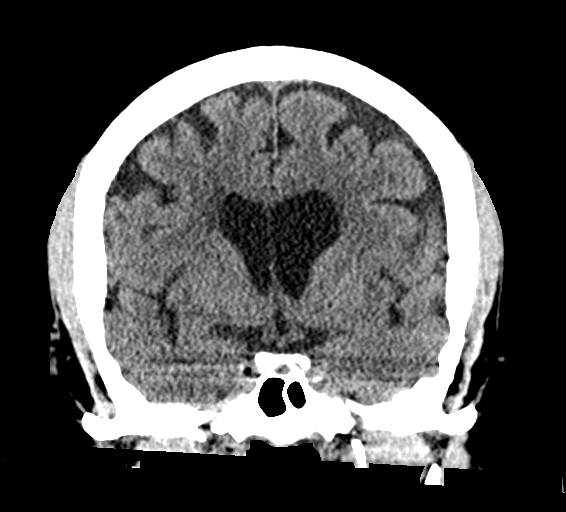
[im 41/74  brain]
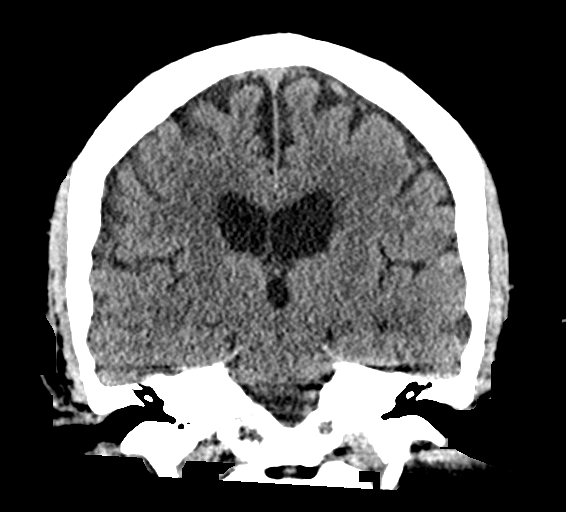

[Series 5: sagittal soft tissue · sagittal · 0.35mm/px · 3 of 67 slices shown]
[im 23/67  brain]
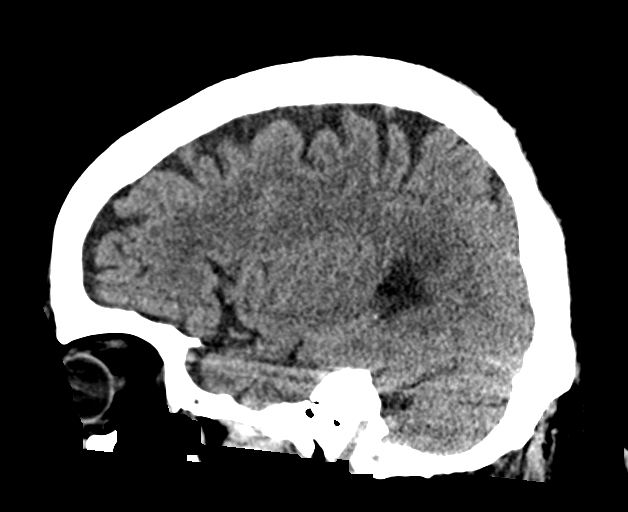
[im 34/67  brain]
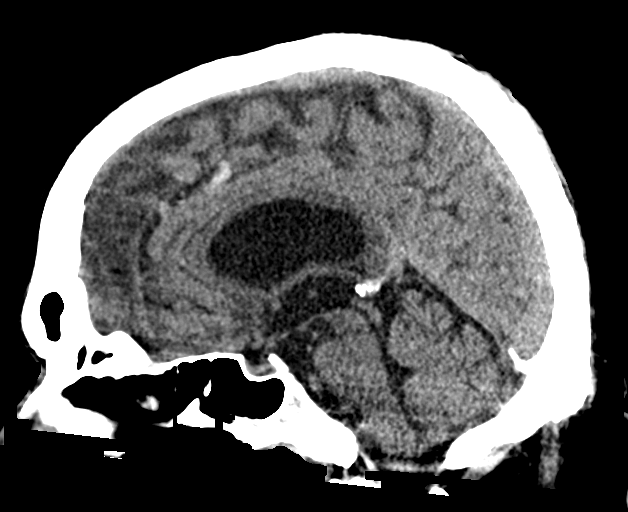
[im 45/67  brain]
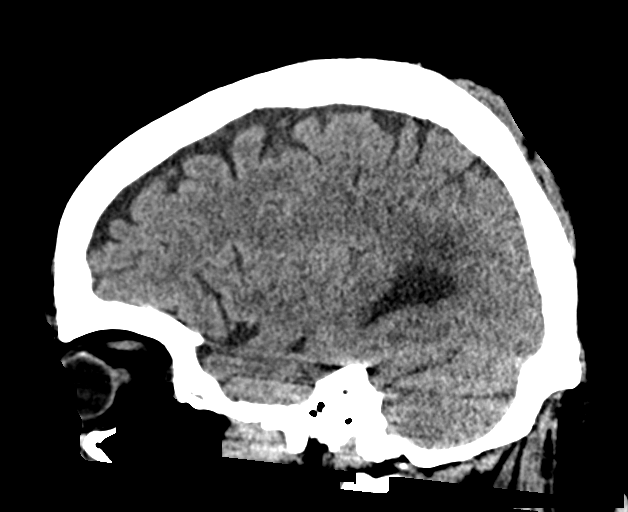

[16 of 47 positions shown; findings below may reference images not displayed]

FINDINGS: Brain: Small volume slightly globular hemorrhage along the falx is
unchanged from yesterday. However, a small volume low-density left
side subdural hematoma (or less likely left subdural hygroma) has
developed since that time and measures up to 4 mm in thickness best
seen on coronal images (series 4 images 20 through 43). No midline
shift or significant intracranial mass effect. No IVH or other
intracranial hemorrhage identified. Basilar cisterns remain normal.

Stable visualized osseous structures. Stable gray-white matter
differentiation throughout the brain. No cortically based acute
infarct identified.

Vascular: Calcified atherosclerosis at the skull base. No suspicious
intracranial vascular hyperdensity.

Skull: No fracture identified.

Sinuses/Orbits: Small maxillary sinus fluid levels and scattered
bilateral sinus mucosal thickening is stable from yesterday.
Tympanic cavities and mastoids remain clear.

Other: Left posterior scalp hematoma is stable. Underlying calvarium
appears intact. No new orbit or scalp soft tissue finding.
IMPRESSION: 1. Positive for trace new Low-density Left Side Subdural Hematoma
since yesterday.
2. Trace hyperdense blood along the falx is stable.
3. No intracranial mass effect at this time, and otherwise stable
brain.
4. Left scalp hematoma with no skull fracture identified.

## 2023-09-15 ENCOUNTER — Ambulatory Visit
Admission: RE | Admit: 2023-09-15 | Discharge: 2023-09-15 | Disposition: A | Discharge status: Home / Self Care | Payer: BC Managed Care – PPO | Source: Ambulatory Visit | Attending: Internal Medicine | Admitting: Internal Medicine

## 2023-09-15 ENCOUNTER — Ambulatory Visit
Admission: RE | Admit: 2023-09-15 | Discharge: 2023-09-15 | Disposition: A | Discharge status: Home / Self Care | Payer: BC Managed Care – PPO | Attending: Internal Medicine | Admitting: Internal Medicine

## 2023-09-15 ENCOUNTER — Ambulatory Visit: Payer: BC Managed Care – PPO | Admitting: Internal Medicine

## 2023-09-15 ENCOUNTER — Encounter: Payer: Self-pay | Admitting: Internal Medicine

## 2023-09-15 VITALS — BP 154/66 | HR 92 | Ht 70.5 in | Wt 234.0 lb

## 2023-09-15 DIAGNOSIS — M79641 Pain in right hand: Secondary | ICD-10-CM

## 2023-09-15 DIAGNOSIS — J441 Chronic obstructive pulmonary disease with (acute) exacerbation: Secondary | ICD-10-CM

## 2023-09-15 DIAGNOSIS — W010XXA Fall on same level from slipping, tripping and stumbling without subsequent striking against object, initial encounter: Secondary | ICD-10-CM

## 2023-09-15 DIAGNOSIS — M7989 Other specified soft tissue disorders: Secondary | ICD-10-CM | POA: Diagnosis not present

## 2023-09-15 DIAGNOSIS — M19031 Primary osteoarthritis, right wrist: Secondary | ICD-10-CM | POA: Diagnosis not present

## 2023-09-15 LAB — POC SOFIA 2 FLU + SARS ANTIGEN FIA
Influenza A, POC: NEGATIVE
Influenza B, POC: NEGATIVE
SARS Coronavirus 2 Ag: NEGATIVE

## 2023-09-15 MED ORDER — PREDNISONE 10 MG PO TABS: 10.0000 mg | ORAL_TABLET | Freq: Every day | ORAL | 0 refills | Status: DC

## 2023-09-15 MED ORDER — PROMETHAZINE-DM 6.25-15 MG/5ML PO SYRP: 5.0000 mL | ORAL_SOLUTION | Freq: Four times a day (QID) | ORAL | 0 refills | Status: DC | PRN

## 2023-09-15 NOTE — Progress Notes (Signed)
Subjective:    Patient ID: Henry Horne, male    DOB: 03/19/1951, 72 y.o.   MRN: [id number]  HPI  Discussed the use of AI scribe software for clinical note transcription with the patient, who gave verbal consent to proceed.  The patient, with a known history of COPD, presented with symptoms of a respiratory infection that started 24 hours prior to the consultation. He reported a runny nose with clear discharge, a productive cough with gray sputum, and shortness of breath. He denied having a headache, ear pain, sore throat, nausea, vomiting, diarrhea, fever, chills, or body aches. He also denied recent exposure to sick individuals.  In addition to the respiratory symptoms, the patient reported a fall down some stairs 24 hours prior, resulting in a swollen right hand. He described the hand as weak, with difficulty closing it and functioning the thumb. He reported taking ibuprofen for the pain, which did not provide significant relief. The patient denied any numbness or tingling in the hand.  The patient's COPD is managed with Breztri and an albuterol rescue inhaler. He denied any current smoking habits. He reported receiving a COVID-19 booster shot and a flu shot approximately three weeks prior to the consultation.       Review of Systems     Past Medical History:  Diagnosis Date   Anxiety    Cancer of kidney (HCC) 11/28/2008   cystoscopy 2010, 2014, 2015, 2016- MD q 6 months   Depression    Emphysema of lung (HCC) 11/28/1994   Hyperlipidemia    Hypertension    Joint pain    Prostate cancer (HCC) 11/28/2006   removed with cryotherapy   SDH (subdural hematoma) (HCC)     Current Outpatient Medications  Medication Sig Dispense Refill   albuterol (VENTOLIN HFA) 108 (90 Base) MCG/ACT inhaler INHALE 1 TO 2 PUFFS BY MOUTH EVERY 4 HOURS AS NEEDED FOR WHEEZING FOR SHORTNESS OF BREATH 8 g 3   amLODipine (NORVASC) 5 MG tablet Take 1 tablet by mouth once daily 90 tablet 1    amoxicillin-clavulanate (AUGMENTIN) 875-125 MG tablet Take 1 tablet by mouth 2 (two) times daily. 20 tablet 0   benzonatate (TESSALON) 200 MG capsule Take 1 capsule (200 mg total) by mouth 3 (three) times daily as needed for cough. 30 capsule 0   BREZTRI AEROSPHERE 160-9-4.8 MCG/ACT AERO Inhale 2 puffs into the lungs 2 (two) times daily. 10.7 g 11   Continuous Glucose Sensor (FREESTYLE LIBRE 14 DAY SENSOR) MISC Use to check blood sugar as needed every day for 14 days, then change out sensor 2 each 5   diclofenac sodium (VOLTAREN) 1 % GEL Apply 2 g topically 3 (three) times daily as needed. For knees 100 g 2   FLUoxetine (PROZAC) 40 MG capsule Take 1 capsule by mouth once daily 90 capsule 0   fluticasone (FLONASE) 50 MCG/ACT nasal spray INSTILL 2 SPRAYS INTO BOTH NOSTRIL DAILY. USE FOR 4 TO 6 WEEKS THEN STOP AND USE SEASONALLY OR AS NEEDED. 16 g 3   fluticasone (FLONASE) 50 MCG/ACT nasal spray Place into the nose. Place 2 sprays into one nostril once daily as needed     Garlic Oil 2 MG CAPS Take 1,000 mg by mouth 2 (two) times daily. Reported on 11/17/2015     imipramine (TOFRANIL) 10 MG tablet TAKE 1 TABLET BY MOUTH AT BEDTIME 90 tablet 0   LANTUS SOLOSTAR 100 UNIT/ML Solostar Pen Inject 70 Units into the skin daily. 30 mL 5  loratadine (CLARITIN) 10 MG tablet Take 1 tablet by mouth once daily 90 tablet 0   losartan (COZAAR) 50 MG tablet Take 1 tablet by mouth once daily 90 tablet 0   metFORMIN (GLUCOPHAGE) 1000 MG tablet TAKE 1 TABLET BY MOUTH TWICE DAILY WITH MEALS 180 tablet 0   MOUNJARO 5 MG/0.5ML Pen Inject 5 mg into the skin once a week. 6 mL 1   omeprazole (PRILOSEC) 40 MG capsule Take 1 capsule (40 mg total) by mouth daily. 30 capsule 3   predniSONE (DELTASONE) 20 MG tablet Take 2 tablets daily (40mg ) for 4 days, take 1 tab daily (20mg ) for 4 days, take half tab daily (10mg ) for 4 days 14 tablet 0   traMADol (ULTRAM) 50 MG tablet TAKE 1 TABLET BY MOUTH EVERY 8 HOURS AS NEEDED 30 tablet 2    No current facility-administered medications for this visit.    Allergies  Allergen Reactions   Zetia [Ezetimibe] Other (See Comments)    Joint pain, congestion, muscle ache   Ace Inhibitors Cough   Pneumococcal Vaccine    Pneumococcal Vaccines     Family History  Problem Relation Age of Onset   Diabetes Mother    Heart disease Father    Cancer Father    Hyperlipidemia Father    Hypertension Father    Diabetes Brother    COPD Brother    Autoimmune disease Brother    Heart disease Brother    Diabetes Brother     Social History   Socioeconomic History   Marital status: Married    Spouse name: Lupita Leash   Number of children: Not on file   Years of education: Not on file   Highest education level: Not on file  Occupational History   Not on file  Tobacco Use   Smoking status: Former    Current packs/day: 0.00    Average packs/day: 1 pack/day for 30.0 years (30.0 ttl pk-yrs)    Types: Cigarettes    Start date: 05/18/1974    Quit date: 05/18/2004    Years since quitting: 19.3   Smokeless tobacco: Former  Building services engineer status: Never Used  Substance and Sexual Activity   Alcohol use: No    Alcohol/week: 0.0 standard drinks of alcohol   Drug use: No   Sexual activity: Not on file  Other Topics Concern   Not on file  Social History Narrative   Not on file   Social Determinants of Health   Financial Resource Strain: Not on file  Food Insecurity: Not on file  Transportation Needs: Not on file  Physical Activity: Not on file  Stress: Not on file  Social Connections: Unknown (09/25/2020)   [facility]   Social Connections    Frequency of Communication with Friends and Family: Not asked    Frequency of Social Gatherings with Friends and Family: Not asked  Intimate Partner Violence: Unknown (09/25/2020)   [facility]   Intimate Partner Violence    Fear of Current or Ex-Partner: Not asked     Emotionally Abused: Not asked    Physically Abused: Not asked    Sexually Abused: Not asked     Constitutional: Denies fever, malaise, fatigue, headache or abrupt weight changes.  HEENT: Pt reports runny nose. Denies eye pain, eye redness, ear pain, ringing in the ears, wax buildup, bloody nose, or sore throat. Respiratory: Pt reports cough and shortness of breath. Denies difficulty breathing.  Cardiovascular: Denies chest pain, chest tightness, palpitations or swelling in the hands or feet.  Gastrointestinal: Denies abdominal pain, bloating, constipation, diarrhea or blood in the stool.  GU: Denies urgency, frequency, pain with urination, burning sensation, blood in urine, odor or discharge. Musculoskeletal: Pt reports right hand pain and swelling. Denies decrease in range of motion, difficulty with gait, muscle pain.  Skin: Pt reports bruise to right hand, abrasion to right elbow. Denies rashes, lesions or ulcercations.  Neurological: Pt reports weakness of right hand. Denies numbness, tingling or problems with balance and coordination.    No other specific complaints in a complete review of systems (except as listed in HPI above).  Objective:   Physical Exam  BP (!) 164/62   Pulse 92   Ht 5' 10.5" (1.791 m)   Wt 234 lb (106.1 kg)   SpO2 98%   BMI 33.10 kg/m   Wt Readings from Last 3 Encounters:  03/27/23 236 lb (107 kg)  01/12/23 241 lb (109.3 kg)  11/04/22 241 lb (109.3 kg)    General: Appears his stated age, obese, in NAD. Skin: Abrasion noted to palmar aspect of right palm, right forearm and right elbow.  Bruising noted extending from the base of the thumb down to the wrist of the right hand. HEENT: Head: normal shape and size, no sinus pressure noted; Eyes: sclera white, no icterus, conjunctiva pink, PERRLA and EOMs intact; Throat/Mouth: Teeth present, mucosa pink and moist, no exudate, lesions or ulcerations noted.  Neck: No adenopathy noted. Cardiovascular: Normal  rate and rhythm. S1,S2 noted.  No murmur, rubs or gallops noted.  Pulmonary/Chest: Normal effort and diminished vesicular breath sounds. No respiratory distress. No wheezes, rales or ronchi noted.  Musculoskeletal: Normal flexion, extension, rotation of the right wrist.  Decreased flexion and extension of the right thumb.  1+ swelling of the right hand.  Generalized pain with palpation over the metacarpals with the right hand.   Neurological: Alert and oriented.    BMET    Component Value Date/Time   NA 134 (L) 06/12/2022 2139   NA 139 11/02/2015 1015   K 3.7 06/12/2022 2139   CL 103 06/12/2022 2139   CO2 23 06/12/2022 2139   GLUCOSE 198 (H) 06/12/2022 2139   BUN 19 06/12/2022 2139   BUN 15 11/02/2015 1015   CREATININE 1.07 06/12/2022 2139   CREATININE 1.05 03/31/2022 0800   CALCIUM 8.7 (L) 06/12/2022 2139   GFRNONAA >60 06/12/2022 2139   GFRNONAA 90 03/18/2021 0754   GFRAA 104 03/18/2021 0754    Lipid Panel     Component Value Date/Time   CHOL 174 06/13/2022 0204   CHOL 197 11/02/2015 1015   TRIG 83 06/13/2022 0204   HDL 37 (L) 06/13/2022 0204   HDL 34 (L) 11/02/2015 1015   CHOLHDL 4.7 06/13/2022 0204   VLDL 17 06/13/2022 0204   LDLCALC 120 (H) 06/13/2022 0204   LDLCALC 117 (H) 03/31/2022 0800    CBC    Component Value Date/Time   WBC 12.8 (H) 06/12/2022 2139   RBC 5.14 06/12/2022 2139   HGB 13.2 06/12/2022 2139   HCT 41.6 06/12/2022 2139   PLT 241 06/12/2022 2139   MCV 80.9 06/12/2022 2139   MCH 25.7 (L) 06/12/2022 2139   MCHC 31.7 06/12/2022 2139   RDW 13.3 06/12/2022 2139   LYMPHSABS 1,494 03/31/2022 0800   MONOABS 740 12/30/2016 0001   EOSABS 268 03/31/2022 0800   BASOSABS 80 03/31/2022 0800    Hgb A1C Lab  Results  Component Value Date   HGBA1C 9.3 (A) 07/14/2022            Assessment & Plan:   Assessment and Plan    COPD exacerbation Symptoms of runny nose, cough with gray sputum, and shortness of breath for 24 hours. No fever, chills,  or body aches. Negative for COVID and flu. COPD exacerbation ruled out as symptoms are different from usual flare-ups. -Start low-dose Prednisone for 5 days. -Prescribe cough syrup. -Advise over-the-counter Mucinex. -If symptoms worsen by Monday, consider prescribing antibiotics.  Right Hand Injury Fall down stairs 24 hours ago with subsequent swelling, pain, and weakness in the right hand. Unable to fully close hand. Pain exacerbated in finger joints. -Order hand X[facility]ray at [facility] to rule out fracture. -Continue over-the-counter pain medication as needed.        Followup with your PCP as previously scheduled Nicki Reaper, NP

## 2023-09-15 NOTE — Patient Instructions (Signed)
RICE Therapy for Routine Care of Injuries Many injuries can be cared for with rest, ice, compression, and elevation. This is also called RICE therapy. RICE therapy includes: Resting the injured body part. Putting ice on the injury. Putting pressure on the injury. This is also called compression. Raising the injured part. This is also called elevation. RICE therapy can help reduce pain and swelling. Supplies needed: Ice. Plastic bag. Towel. Elastic bandage. Pillow or pillows to raise the injured body part. How to care for your injury with RICE therapy Rest Try to rest the injured part of your body. You can go back to your normal activities when your health care provider says it's okay to do them and when you can do them without pain. Ask what things are safe for you to do. Some injuries heal better with early movement instead of resting. If you rest the injury too much, it may not heal as well. Ask your provider if you should do exercises to help your injury get better. Ice Putting ice on your injury can help to lessen swelling and pain. Do not apply ice directly to your skin. Use ice on as many days as told by your provider. If told, put ice on the area. Put ice in a plastic bag. Place a towel between your skin and the bag. Leave the ice on for 20 minutes, 2-3 times a day. If your skin turns bright red, take off the ice right away to prevent skin damage. The risk of damage is higher if you can't feel pain, heat, or cold.  Compression Put pressure, also called compression, on your injured area. This can be done with an elastic bandage. If this type of bandage has been put on your injury: Follow instructions on the package the bandage came in about how to use it. Do not wrap the bandage too tightly. Wrap the bandage more loosely if part of your body beyond the bandage looks blue, or is swollen, cold, painful, or loses feeling. Take off the bandage and put it on again every 3-4 hours or  as told by your provider. Call your provider if the bandage seems to make your injury worse.  Elevation Raise the injured area above the level of your heart while you're sitting or lying down. Use a pillow to support your injured area as needed. Follow these instructions at home: If your symptoms get worse or last a long time, make a follow-up appointment with your provider. You may need to have imaging tests, such as X-rays or an MRI. If you have imaging tests, ask how to get your results when they are ready. Contact a health care provider if: You keep having pain and swelling. Your symptoms get worse. Get help right away if: You have sudden, very bad pain at your injury or lower than your injury. You have tingling or numbness at your injury or lower than your injury, and it does not go away when you take the bandage off. This information is not intended to replace advice given to you by your health care provider. Make sure you discuss any questions you have with your health care provider. Document Revised: 01/30/2023 Document Reviewed: 01/30/2023 Elsevier Patient Education  2024 [employer].

## 2023-09-22 ENCOUNTER — Ambulatory Visit (INDEPENDENT_AMBULATORY_CARE_PROVIDER_SITE_OTHER): Payer: BC Managed Care – PPO | Admitting: Internal Medicine

## 2023-09-22 ENCOUNTER — Encounter: Payer: Self-pay | Admitting: Internal Medicine

## 2023-09-22 VITALS — BP 142/62 | Ht 70.5 in | Wt 238.0 lb

## 2023-09-22 DIAGNOSIS — B029 Zoster without complications: Secondary | ICD-10-CM

## 2023-09-22 MED ORDER — VALACYCLOVIR HCL 1 G PO TABS: 1000.0000 mg | ORAL_TABLET | Freq: Three times a day (TID) | ORAL | 0 refills | Status: DC

## 2023-09-22 MED ORDER — GABAPENTIN 100 MG PO CAPS: 100.0000 mg | ORAL_CAPSULE | Freq: Three times a day (TID) | ORAL | 0 refills | Status: DC

## 2023-09-22 NOTE — Patient Instructions (Signed)

## 2023-09-22 NOTE — Progress Notes (Signed)
Subjective:    Patient ID: Henry Horne, male    DOB: 08-04-1951, 72 y.o.   MRN: [id number]  HPI Discussed the use of AI scribe software for clinical note transcription with the patient, who gave verbal consent to proceed.   The patient, with a history of shingles, presented with a new rash that started two nights prior. He first noticed a small red dot on his back, initially thought to be an ingrown hair. The following morning, the patient noticed the rash had expanded to include about four blisters in the same area on his back, with another blister slightly ahead of it. The rash was described as itchy and painful. The patient had not applied any topical treatments to the rash. He had not received the shingles vaccine.   Review of Systems     Past Medical History:  Diagnosis Date   Anxiety    Cancer of kidney (HCC) 11/28/2008   cystoscopy 2010, 2014, 2015, 2016- MD q 6 months   Depression    Emphysema of lung (HCC) 11/28/1994   Hyperlipidemia    Hypertension    Joint pain    Prostate cancer (HCC) 11/28/2006   removed with cryotherapy   SDH (subdural hematoma) (HCC)     Current Outpatient Medications  Medication Sig Dispense Refill   albuterol (VENTOLIN HFA) 108 (90 Base) MCG/ACT inhaler INHALE 1 TO 2 PUFFS BY MOUTH EVERY 4 HOURS AS NEEDED FOR WHEEZING FOR SHORTNESS OF BREATH 8 g 3   amLODipine (NORVASC) 5 MG tablet Take 1 tablet by mouth once daily 90 tablet 1   BREZTRI AEROSPHERE 160-9-4.8 MCG/ACT AERO Inhale 2 puffs into the lungs 2 (two) times daily. 10.7 g 11   Continuous Glucose Sensor (FREESTYLE LIBRE 14 DAY SENSOR) MISC Use to check blood sugar as needed every day for 14 days, then change out sensor 2 each 5   diclofenac sodium (VOLTAREN) 1 % GEL Apply 2 g topically 3 (three) times daily as needed. For knees 100 g 2   FLUoxetine (PROZAC) 40 MG capsule Take 1 capsule by mouth once daily 90 capsule 0   fluticasone (FLONASE) 50 MCG/ACT nasal spray Place into the nose.  Place 2 sprays into one nostril once daily as needed     Garlic Oil 2 MG CAPS Take 1,000 mg by mouth 2 (two) times daily. Reported on 11/17/2015     imipramine (TOFRANIL) 10 MG tablet TAKE 1 TABLET BY MOUTH AT BEDTIME 90 tablet 0   LANTUS SOLOSTAR 100 UNIT/ML Solostar Pen Inject 70 Units into the skin daily. 30 mL 5   loratadine (CLARITIN) 10 MG tablet Take 1 tablet by mouth once daily 90 tablet 0   losartan (COZAAR) 50 MG tablet Take 1 tablet by mouth once daily 90 tablet 0   metFORMIN (GLUCOPHAGE) 1000 MG tablet TAKE 1 TABLET BY MOUTH TWICE DAILY WITH MEALS 180 tablet 0   MOUNJARO 5 MG/0.5ML Pen Inject 5 mg into the skin once a week. 6 mL 1   omeprazole (PRILOSEC) 40 MG capsule Take 1 capsule (40 mg total) by mouth daily. 30 capsule 3   predniSONE (DELTASONE) 10 MG tablet Take 1 tablet (10 mg total) by mouth daily with breakfast. 5 tablet 0   promethazine-dextromethorphan (PROMETHAZINE-DM) 6.25-15 MG/5ML syrup Take 5 mLs by mouth 4 (four) times daily as needed. 118 mL 0   traMADol (ULTRAM) 50 MG tablet TAKE 1 TABLET BY MOUTH EVERY 8 HOURS AS NEEDED 30 tablet 2   No current  facility-administered medications for this visit.    Allergies  Allergen Reactions   Zetia [Ezetimibe] Other (See Comments)    Joint pain, congestion, muscle ache   Ace Inhibitors Cough   Pneumococcal Vaccine    Pneumococcal Vaccines     Family History  Problem Relation Age of Onset   Diabetes Mother    Heart disease Father    Cancer Father    Hyperlipidemia Father    Hypertension Father    Diabetes Brother    COPD Brother    Autoimmune disease Brother    Heart disease Brother    Diabetes Brother     Social History   Socioeconomic History   Marital status: Married    Spouse name: Lupita Leash   Number of children: Not on file   Years of education: Not on file   Highest education level: Not on file  Occupational History   Not on file  Tobacco Use   Smoking status: Former    Current packs/day: 0.00     Average packs/day: 1 pack/day for 30.0 years (30.0 ttl pk-yrs)    Types: Cigarettes    Start date: 05/18/1974    Quit date: 05/18/2004    Years since quitting: 19.3   Smokeless tobacco: Former  Building services engineer status: Never Used  Substance and Sexual Activity   Alcohol use: No    Alcohol/week: 0.0 standard drinks of alcohol   Drug use: No   Sexual activity: Not on file  Other Topics Concern   Not on file  Social History Narrative   Not on file   Social Determinants of Health   Financial Resource Strain: Not on file  Food Insecurity: Not on file  Transportation Needs: Not on file  Physical Activity: Not on file  Stress: Not on file  Social Connections: Unknown (09/25/2020)   [facility]   Social Connections    Frequency of Communication with Friends and Family: Not asked    Frequency of Social Gatherings with Friends and Family: Not asked  Intimate Partner Violence: Unknown (09/25/2020)   [facility]   Intimate Partner Violence    Fear of Current or Ex-Partner: Not asked    Emotionally Abused: Not asked    Physically Abused: Not asked    Sexually Abused: Not asked     Constitutional: Denies fever, malaise, fatigue, headache or abrupt weight changes.  Respiratory: Denies difficulty breathing, shortness of breath, cough or sputum production.   Cardiovascular: Denies chest pain, chest tightness, palpitations or swelling in the hands or feet.  Skin: Pt reports rash of back. Denies ulcercations.   No other specific complaints in a complete review of systems (except as listed in HPI above).  Objective:   Physical Exam  BP (!) 142/62   Ht 5' 10.5" (1.791 m)   Wt 238 lb (108 kg)   BMI 33.67 kg/m   Wt Readings from Last 3 Encounters:  09/15/23 234 lb (106.1 kg)  03/27/23 236 lb (107 kg)  01/12/23 241 lb (109.3 kg)    General: Appears his stated age, obese, in NAD. Skin: Grouped vesicular rash on  erythematous base noted in dermatomal pattern starting at the left flank extending around to the left abdomen. Cardiovascular: Normal rate and rhythm.  Pulmonary/Chest: Normal effort and positive vesicular breath sounds. No respiratory distress.  Neurological: Alert and oriented.   BMET    Component Value Date/Time   NA 134 (L) 06/12/2022 2139  NA 139 11/02/2015 1015   K 3.7 06/12/2022 2139   CL 103 06/12/2022 2139   CO2 23 06/12/2022 2139   GLUCOSE 198 (H) 06/12/2022 2139   BUN 19 06/12/2022 2139   BUN 15 11/02/2015 1015   CREATININE 1.07 06/12/2022 2139   CREATININE 1.05 03/31/2022 0800   CALCIUM 8.7 (L) 06/12/2022 2139   GFRNONAA >60 06/12/2022 2139   GFRNONAA 90 03/18/2021 0754   GFRAA 104 03/18/2021 0754    Lipid Panel     Component Value Date/Time   CHOL 174 06/13/2022 0204   CHOL 197 11/02/2015 1015   TRIG 83 06/13/2022 0204   HDL 37 (L) 06/13/2022 0204   HDL 34 (L) 11/02/2015 1015   CHOLHDL 4.7 06/13/2022 0204   VLDL 17 06/13/2022 0204   LDLCALC 120 (H) 06/13/2022 0204   LDLCALC 117 (H) 03/31/2022 0800    CBC    Component Value Date/Time   WBC 12.8 (H) 06/12/2022 2139   RBC 5.14 06/12/2022 2139   HGB 13.2 06/12/2022 2139   HCT 41.6 06/12/2022 2139   PLT 241 06/12/2022 2139   MCV 80.9 06/12/2022 2139   MCH 25.7 (L) 06/12/2022 2139   MCHC 31.7 06/12/2022 2139   RDW 13.3 06/12/2022 2139   LYMPHSABS 1,494 03/31/2022 0800   MONOABS 740 12/30/2016 0001   EOSABS 268 03/31/2022 0800   BASOSABS 80 03/31/2022 0800    Hgb A1C Lab Results  Component Value Date   HGBA1C 9.3 (A) 07/14/2022          Assessment & Plan:   Assessment and Plan    Herpes Zoster (Shingles) New onset rash with blisters, itching, and pain. No prior shingles vaccination. -Start Valtrex 1 tablet TID for 7 days. -Avoid contact with infants less than a year old, pregnant women, and immunocompromised individuals until rash resolves. -Consider calamine lotion for  itching.  Neuropathic Pain Pain associated with shingles. -Start Gabapentin 100 mg 3 times daily for pain control.        Follow-up with your PCP as previously scheduled Nicki Reaper, NP

## 2023-09-25 ENCOUNTER — Other Ambulatory Visit: Payer: Self-pay | Admitting: Student in an Organized Health Care Education/Training Program

## 2023-09-25 DIAGNOSIS — R052 Subacute cough: Secondary | ICD-10-CM

## 2023-11-09 ENCOUNTER — Other Ambulatory Visit: Payer: Self-pay | Admitting: Family Medicine

## 2023-11-09 NOTE — Telephone Encounter (Signed)
Requested Prescriptions  Pending Prescriptions Disp Refills   imipramine (TOFRANIL) 10 MG tablet [Pharmacy Med Name: Imipramine HCl 10 MG Oral Tablet] 90 tablet 1    Sig: TAKE 1 TABLET BY MOUTH AT BEDTIME     Psychiatry:  Antidepressants - Heterocyclics (TCAs) Passed - 11/09/2023 11:42 AM      Passed - Completed PHQ-2 or PHQ-9 in the last 360 days      Passed - Valid encounter within last 6 months    Recent Outpatient Visits           1 month ago Herpes zoster without complication   [place] [facility] [place], Salvadore Oxford, NP   1 month ago Right hand pain   [place] [facility] [place], Salvadore Oxford, NP   7 months ago Acute bronchitis with COPD [facility])   [place] [facility] Smitty Cords, DO   10 months ago Acute bronchitis with COPD [facility])   [place] [facility] Smitty Cords, DO   1 year ago COPD exacerbation [facility])    [facility] [place], Salvadore [place], [place]

## 2023-11-15 ENCOUNTER — Other Ambulatory Visit: Payer: Self-pay | Admitting: Family Medicine

## 2023-11-15 DIAGNOSIS — I10 Essential (primary) hypertension: Secondary | ICD-10-CM

## 2023-11-15 NOTE — Telephone Encounter (Signed)
Requested medication (s) are due for refill today- yes  Requested medication (s) are on the active medication list -yes  Future visit scheduled -no  Last refill: 08/14/23 #90  Notes to clinic: fails lab protocol- over 1 year-06/12/22  Requested Prescriptions  Pending Prescriptions Disp Refills   losartan (COZAAR) 50 MG tablet [Pharmacy Med Name: Losartan Potassium 50 MG Oral Tablet] 90 tablet 0    Sig: Take 1 tablet by mouth once daily     Cardiovascular:  Angiotensin Receptor Blockers Failed - 11/15/2023 12:02 PM      Failed - Cr in normal range and within 180 days    Creat  Date Value Ref Range Status  03/31/2022 1.05 0.70 - 1.28 mg/dL Final   Creatinine, Ser  Date Value Ref Range Status  06/12/2022 1.07 0.61 - 1.24 mg/dL Final         Failed - K in normal range and within 180 days    Potassium  Date Value Ref Range Status  06/12/2022 3.7 3.5 - 5.1 mmol/L Final         Failed - Last BP in normal range    BP Readings from Last 1 Encounters:  09/22/23 (!) 142/62         Passed - Patient is not pregnant      Passed - Valid encounter within last 6 months    Recent Outpatient Visits           1 month ago Herpes zoster without complication   [place] [facility] [place], Salvadore Oxford, NP   2 months ago Right hand pain   [place] [facility] [place], Salvadore Oxford, NP   7 months ago Acute bronchitis with COPD [facility])   [place] [facility] Smitty Cords, DO   10 months ago Acute bronchitis with COPD [facility])   [place] [facility] Smitty Cords, DO   1 year ago COPD exacerbation [facility])   [place] [facility] [place], Salvadore Oxford, NP                 Requested Prescriptions  Pending Prescriptions Disp Refills   losartan (COZAAR) 50 MG tablet [Pharmacy Med Name: Losartan Potassium 50 MG Oral Tablet] 90 tablet 0    Sig: Take 1 tablet by mouth once daily      Cardiovascular:  Angiotensin Receptor Blockers Failed - 11/15/2023 12:02 PM      Failed - Cr in normal range and within 180 days    Creat  Date Value Ref Range Status  03/31/2022 1.05 0.70 - 1.28 mg/dL Final   Creatinine, Ser  Date Value Ref Range Status  06/12/2022 1.07 0.61 - 1.24 mg/dL Final         Failed - K in normal range and within 180 days    Potassium  Date Value Ref Range Status  06/12/2022 3.7 3.5 - 5.1 mmol/L Final         Failed - Last BP in normal range    BP Readings from Last 1 Encounters:  09/22/23 (!) 142/62         Passed - Patient is not pregnant      Passed - Valid encounter within last 6 months    Recent Outpatient Visits           1 month ago Herpes zoster without complication   [place] [facility] [place], Salvadore Oxford, NP  2 months ago Right hand pain   [place] [facility] [place], [place], NP   7 months ago Acute bronchitis with COPD [facility])   [place] Smitty Cords, DO   10 months ago Acute bronchitis with COPD [facility])   [place] [facility] Smitty Cords, DO   1 year ago COPD exacerbation [facility])    [facility], Salvadore [place], [place]

## 2023-11-20 ENCOUNTER — Other Ambulatory Visit: Payer: Self-pay | Admitting: Family Medicine

## 2023-11-20 DIAGNOSIS — R451 Restlessness and agitation: Secondary | ICD-10-CM

## 2023-11-21 NOTE — Telephone Encounter (Signed)
Requested Prescriptions  Pending Prescriptions Disp Refills   FLUoxetine (PROZAC) 40 MG capsule [Pharmacy Med Name: FLUOXETINE 40MG  CAP] 90 capsule 0    Sig: Take 1 capsule by mouth once daily     Psychiatry:  Antidepressants - SSRI Passed - 11/21/2023  1:06 PM      Passed - Completed PHQ-2 or PHQ-9 in the last 360 days      Passed - Valid encounter within last 6 months    Recent Outpatient Visits           2 months ago Herpes zoster without complication   [place] [facility] [place], Salvadore Oxford, NP   2 months ago Right hand pain   [place] [facility] [place], Salvadore Oxford, NP   7 months ago Acute bronchitis with COPD [facility])   [place] [facility] Smitty Cords, DO   10 months ago Acute bronchitis with COPD [facility])   [place] Smit[facility] Cords, DO   1 year ago COPD exacerbation [facility])   [place] [place], Salvadore Oxford, [place]

## 2023-12-06 ENCOUNTER — Other Ambulatory Visit: Payer: Self-pay | Admitting: Family Medicine

## 2023-12-06 DIAGNOSIS — I10 Essential (primary) hypertension: Secondary | ICD-10-CM

## 2023-12-08 NOTE — Telephone Encounter (Signed)
 Requested Prescriptions  Pending Prescriptions Disp Refills   amLODipine  (NORVASC ) 5 MG tablet [Pharmacy Med Name: amLODIPine  Besylate 5 MG Oral Tablet] 90 tablet 0    Sig: Take 1 tablet by mouth once daily     Cardiovascular: Calcium  Channel Blockers 2 Failed - 12/08/2023  4:31 PM      Failed - Last BP in normal range    BP Readings from Last 1 Encounters:  09/22/23 (!) 142/62         Passed - Last Heart Rate in normal range    Pulse Readings from Last 1 Encounters:  09/15/23 92         Passed - Valid encounter within last 6 months    Recent Outpatient Visits           2 months ago Herpes zoster without complication   [place] [facility] [place], Angeline ORN, NP   2 months ago Right hand pain   [place] [facility] [place], Angeline ORN, NP   8 months ago Acute bronchitis with COPD [facility])   [place] [facility] [place]a PARAS, DO   11 months ago Acute bronchitis with COPD [facility])   [place] [facility] [place]a PARAS, DO   1 year ago COPD exacerbation [facility])    [facility] [place], Angeline ORN, [place]

## 2023-12-13 ENCOUNTER — Other Ambulatory Visit: Payer: Self-pay | Admitting: Family Medicine

## 2023-12-14 NOTE — Telephone Encounter (Signed)
Requested medication (s) are due for refill today: Yes  Requested medication (s) are on the active medication list: Yes  Last refill:  08/22/23  Future visit scheduled: No  Notes to clinic:  Unable to refill per protocol due to failed labs, no updated results.      Requested Prescriptions  Pending Prescriptions Disp Refills   metFORMIN (GLUCOPHAGE) 1000 MG tablet [Pharmacy Med Name: metFORMIN HCl 1000 MG Oral Tablet] 180 tablet 0    Sig: TAKE 1 TABLET BY MOUTH TWICE DAILY WITH MEALS     Endocrinology:  Diabetes - Biguanides Failed - 12/14/2023  9:26 AM      Failed - Cr in normal range and within 360 days    Creat  Date Value Ref Range Status  03/31/2022 1.05 0.70 - 1.28 mg/dL Final   Creatinine, Ser  Date Value Ref Range Status  06/12/2022 1.07 0.61 - 1.24 mg/dL Final         Failed - HBA1C is between 0 and 7.9 and within 180 days    Hemoglobin A1C  Date Value Ref Range Status  07/14/2022 9.3 (A) 4.0 - 5.6 % Final   Hgb A1c MFr Bld  Date Value Ref Range Status  03/31/2022 8.9 (H) <5.7 % of total Hgb Final    Comment:    For someone without known diabetes, a hemoglobin A1c value of 6.5% or greater indicates that they may have  diabetes and this should be confirmed with a follow-up  test. . For someone with known diabetes, a value <7% indicates  that their diabetes is well controlled and a value  greater than or equal to 7% indicates suboptimal  control. A1c targets should be individualized based on  duration of diabetes, age, comorbid conditions, and  other considerations. . Currently, no consensus exists regarding use of hemoglobin A1c for diagnosis of diabetes for children. .          Failed - eGFR in normal range and within 360 days    GFR, Est African American  Date Value Ref Range Status  03/18/2021 104 > OR = 60 mL/min/1.27m2 Final   GFR, Est Non African American  Date Value Ref Range Status  03/18/2021 90 > OR = 60 mL/min/1.48m2 Final   GFR,  Estimated  Date Value Ref Range Status  06/12/2022 >60 >60 mL/min Final    Comment:    (NOTE) Calculated using the CKD-EPI Creatinine Equation (2021)    eGFR  Date Value Ref Range Status  03/31/2022 76 > OR = 60 mL/min/1.19m2 Final    Comment:    The eGFR is based on the CKD-EPI 2021 equation. To calculate  the new eGFR from a previous Creatinine or Cystatin C result, go to https://www.kidney.org/professionals/ kdoqi/gfr%5Fcalculator          Failed - B12 Level in normal range and within 720 days    Vitamin B-12  Date Value Ref Range Status  06/13/2022 175 (L) 180 - 914 pg/mL Final    Comment:    (NOTE) This assay is not validated for testing neonatal or myeloproliferative syndrome specimens for Vitamin B12 levels. Performed at [facility], [address], [place], [place] [address]          Failed - CBC within normal limits and completed in the last 12 months    WBC  Date Value Ref Range Status  06/12/2022 12.8 (H) 4.0 - 10.5 K/uL Final   RBC  Date Value Ref Range Status  06/12/2022 5.14  4.22 - 5.81 MIL/uL Final   Hemoglobin  Date Value Ref Range Status  06/12/2022 13.2 13.0 - 17.0 g/dL Final   HCT  Date Value Ref Range Status  06/12/2022 41.6 39.0 - 52.0 % Final   MCHC  Date Value Ref Range Status  06/12/2022 31.7 30.0 - 36.0 g/dL Final   [facility]  Date Value Ref Range Status  06/12/2022 25.7 (L) 26.0 - 34.0 pg Final   MCV  Date Value Ref Range Status  06/12/2022 80.9 80.0 - 100.0 fL Final   No results found for: "PLTCOUNTKUC", "LABPLAT", "POCPLA" RDW  Date Value Ref Range Status  06/12/2022 13.3 11.5 - 15.5 % Final         Passed - Valid encounter within last 6 months    Recent Outpatient Visits           2 months ago Herpes zoster without complication   [place] [facility] [place], Salvadore Oxford, NP   3 months ago Right hand pain   [place] [facility] [place], Salvadore Oxford, NP   8 months ago Acute bronchitis  with COPD [facility])   [place] [facility] Smitty Cords, DO   11 months ago Acute bronchitis with COPD [facility])   [place] Smit[facility] Cords, DO   1 year ago COPD exacerbation [facility])   [place] [facility] [place], Salvadore [place], [place]

## 2024-01-08 ENCOUNTER — Other Ambulatory Visit: Payer: Self-pay | Admitting: Family Medicine

## 2024-01-08 DIAGNOSIS — E1142 Type 2 diabetes mellitus with diabetic polyneuropathy: Secondary | ICD-10-CM

## 2024-01-09 NOTE — Telephone Encounter (Signed)
Requested medication (s) are due for refill today: yes  Requested medication (s) are on the active medication list: yes  Last refill:  03/24/23  Future visit scheduled: yes  Notes to clinic:  Unable to refill per protocol due to failed labs, no updated results.      Requested Prescriptions  Pending Prescriptions Disp Refills   LANTUS SOLOSTAR 100 UNIT/ML Solostar Pen [Pharmacy Med Name: Lantus SoloStar 100 UNIT/ML Subcutaneous Solution Pen-injector] 30 mL 0    Sig: INJECT 70 UNITS SUBCUTANEOUSLY ONCE DAILY     Endocrinology:  Diabetes - Insulins Failed - 01/09/2024  9:37 AM      Failed - HBA1C is between 0 and 7.9 and within 180 days    Hemoglobin A1C  Date Value Ref Range Status  07/14/2022 9.3 (A) 4.0 - 5.6 % Final   Hgb A1c MFr Bld  Date Value Ref Range Status  03/31/2022 8.9 (H) <5.7 % of total Hgb Final    Comment:    For someone without known diabetes, a hemoglobin A1c value of 6.5% or greater indicates that they may have  diabetes and this should be confirmed with a follow-up  test. . For someone with known diabetes, a value <7% indicates  that their diabetes is well controlled and a value  greater than or equal to 7% indicates suboptimal  control. A1c targets should be individualized based on  duration of diabetes, age, comorbid conditions, and  other considerations. . Currently, no consensus exists regarding use of hemoglobin A1c for diagnosis of diabetes for children. Verna Czech - Valid encounter within last 6 months    Recent Outpatient Visits           3 months ago Herpes zoster without complication   [facility] [place], Salvadore [place], NP   3 months ago Right hand pain   [place] [facility] [place], Salvadore Oxford, NP   9 months ago Acute bronchitis with COPD [facility])   [place] [facility] Smitty Cords, DO   12 months ago Acute bronchitis with COPD [facility])   [place] [facility] Smit[facility] Cords, DO   1 year ago COPD exacerbation [facility])    [facility] [place], Salvadore [place], [place]

## 2024-01-10 ENCOUNTER — Other Ambulatory Visit: Payer: Self-pay | Admitting: Family Medicine

## 2024-01-10 DIAGNOSIS — E1142 Type 2 diabetes mellitus with diabetic polyneuropathy: Secondary | ICD-10-CM

## 2024-01-11 NOTE — Telephone Encounter (Signed)
Call to patient- scheduled appointment- patient states he is not out of insulin and has enough to last until his appointment. Requested Prescriptions  Pending Prescriptions Disp Refills   LANTUS SOLOSTAR 100 UNIT/ML Solostar Pen [Pharmacy Med Name: Lantus SoloStar 100 UNIT/ML Subcutaneous Solution Pen-injector] 30 mL 0    Sig: INJECT 70 UNITS SUBCUTANEOUSLY ONCE DAILY     Endocrinology:  Diabetes - Insulins Failed - 01/11/2024  8:40 AM      Failed - HBA1C is between 0 and 7.9 and within 180 days    Hemoglobin A1C  Date Value Ref Range Status  07/14/2022 9.3 (A) 4.0 - 5.6 % Final   Hgb A1c MFr Bld  Date Value Ref Range Status  03/31/2022 8.9 (H) <5.7 % of total Hgb Final    Comment:    For someone without known diabetes, a hemoglobin A1c value of 6.5% or greater indicates that they may have  diabetes and this should be confirmed with a follow-up  test. . For someone with known diabetes, a value <7% indicates  that their diabetes is well controlled and a value  greater than or equal to 7% indicates suboptimal  control. A1c targets should be individualized based on  duration of diabetes, age, comorbid conditions, and  other considerations. . Currently, no consensus exists regarding use of hemoglobin A1c for diagnosis of diabetes for children. Verna Czech - Valid encounter within last 6 months    Recent Outpatient Visits           3 months ago Herpes zoster without complication   [place] [facility] [place], Salvadore Oxford, NP   3 months ago Right hand pain   [place] [facility] [place], Salvadore Oxford, NP   9 months ago Acute bronchitis with COPD [facility])   [place] [facility] Smitty Cords, DO   12 months ago Acute bronchitis with [facility])   [place] [facility] Smitty Cords, DO   1 year ago COPD exacerbation [facility])   [place] [facility] [place], Salvadore [place], NP        Future Appointments             Tomorrow Althea Charon, Netta Neat, DO  [facility]

## 2024-01-12 ENCOUNTER — Encounter: Payer: Self-pay | Admitting: Family Medicine

## 2024-01-12 ENCOUNTER — Ambulatory Visit: Payer: BC Managed Care – PPO | Admitting: Family Medicine

## 2024-01-12 VITALS — BP 122/68 | HR 87 | Ht 70.5 in | Wt 239.0 lb

## 2024-01-12 DIAGNOSIS — E1142 Type 2 diabetes mellitus with diabetic polyneuropathy: Secondary | ICD-10-CM | POA: Diagnosis not present

## 2024-01-12 DIAGNOSIS — Z1211 Encounter for screening for malignant neoplasm of colon: Secondary | ICD-10-CM

## 2024-01-12 DIAGNOSIS — J0110 Acute frontal sinusitis, unspecified: Secondary | ICD-10-CM

## 2024-01-12 DIAGNOSIS — R451 Restlessness and agitation: Secondary | ICD-10-CM | POA: Diagnosis not present

## 2024-01-12 DIAGNOSIS — I10 Essential (primary) hypertension: Secondary | ICD-10-CM | POA: Diagnosis not present

## 2024-01-12 DIAGNOSIS — Z8042 Family history of malignant neoplasm of prostate: Secondary | ICD-10-CM

## 2024-01-12 DIAGNOSIS — Z794 Long term (current) use of insulin: Secondary | ICD-10-CM

## 2024-01-12 DIAGNOSIS — E782 Mixed hyperlipidemia: Secondary | ICD-10-CM

## 2024-01-12 DIAGNOSIS — M150 Primary generalized (osteo)arthritis: Secondary | ICD-10-CM

## 2024-01-12 LAB — POCT GLYCOSYLATED HEMOGLOBIN (HGB A1C): Hemoglobin A1C: 8.8 % — AB (ref 4.0–5.6)

## 2024-01-12 MED ORDER — MOUNJARO 7.5 MG/0.5ML ~~LOC~~ SOAJ: 7.5000 mg | SUBCUTANEOUS | 0 refills | Status: DC

## 2024-01-12 MED ORDER — FLUOXETINE HCL 40 MG PO CAPS
40.0000 mg | ORAL_CAPSULE | Freq: Every day | ORAL | 3 refills | Status: AC
Start: 2024-01-12 — End: ?

## 2024-01-12 MED ORDER — LANTUS SOLOSTAR 100 UNIT/ML ~~LOC~~ SOPN: 70.0000 [IU] | PEN_INJECTOR | Freq: Every day | SUBCUTANEOUS | 5 refills | Status: DC

## 2024-01-12 MED ORDER — TRAMADOL HCL 50 MG PO TABS: 50.0000 mg | ORAL_TABLET | Freq: Three times a day (TID) | ORAL | 2 refills | Status: DC | PRN

## 2024-01-12 MED ORDER — AMLODIPINE BESYLATE 5 MG PO TABS: 5.0000 mg | ORAL_TABLET | Freq: Every day | ORAL | 3 refills | Status: DC

## 2024-01-12 MED ORDER — AMOXICILLIN-POT CLAVULANATE 875-125 MG PO TABS: 1.0000 | ORAL_TABLET | Freq: Two times a day (BID) | ORAL | 0 refills | Status: DC

## 2024-01-12 NOTE — Patient Instructions (Addendum)
Thank you for coming to the office today.  Labs next week Thurs 2/20  Dose increase Mounjaro 5 up to 7.5 for 1 month, contact us before we order any further, if we can go to 10mg   Colon Cancer Screening:  Ordered the Cologuard (home kit) test for colon cancer screening. Stay tuned for further updates.  It will be shipped to you directly. If not received in 2-4 weeks, call us or the company.   If you send it back and no results are received in 2-4 weeks, call us or the company as well!   Colon Cancer Screening: - For all adults age 79+ routine colon cancer screening is highly recommended.     - Recent guidelines from American [employer] Society recommend starting age of 67 - Early detection of colon cancer is important, because often there are no warning signs or symptoms, also if found early usually it can be cured. Late stage is hard to treat.   - If Cologuard is NEGATIVE, then it is good for 3 years before next due - If Cologuard is POSITIVE, then it is strongly advised to get a Colonoscopy, which allows the GI doctor to locate the source of the cancer or polyp (even very early stage) and treat it by removing it. ------------------------- Follow instructions to collect sample, you may call the company for any help or questions, 24/7 telephone support at [phone].   Please schedule a Follow-up Appointment to: Return in about 4 months (around 05/11/2024) for 4 month follow-up DM A1c, COPD.  If you have any other questions or concerns, please feel free to call the office or send a message through MyChart. You may also schedule an earlier appointment if necessary.  Additionally, you may be receiving a survey about your experience at our office within a few days to 1 week by e-mail or mail. We value your feedback.  Saralyn Pilar, DO [facility], [place]

## 2024-01-12 NOTE — Progress Notes (Signed)
Subjective:    Patient ID: Henry Horne, male    DOB: 1951/05/31, 73 y.o.   MRN: [id number]  Henry Horne is a 73 y.o. male presenting on 01/12/2024 for Diabetes   HPI  Discussed the use of AI scribe software for clinical note transcription with the patient, who gave verbal consent to proceed.  History of Present Illness   Henry Horne is a 73 year old male who presents for a medication follow-up and lab work.       Sinusitis Persistent cough He has a persistent cough for the past month, characterized by coughing, gagging, and expectoration of mucus. This cough is distinct from his usual cough, and no treatments have been effective in alleviating it.   HYPERLIPIDEMIA: On Statin   CHRONIC DM, Type 2 with history of DM Neuropathy Not adhering to diet A1c down to 8.8 still high - He has [employer] sensor and keeping track of it. Meds: Lantus 70u morning, Mounjaro 5mg  weekly, Metformin 1000mg  BID Reports good compliance. Tolerating well w/o side-effects Currently on ARB Lifestyle: - Diet (Improving diet overall, limiting sugars/carbs - admits poor diet often, more sweets) - Exercise (limited regular exercise, knee chronic joint pain) - Last DM Eye exam at [facility] in [place] - Prior history of nerve testing L>R reduced sensation Admits some numbness tingling feet Denies hypoglycemia  History Prostate Cancer Urology Dr Achilles Dunk in [facility] - did cystoscopy. Check PSA next week Has apt soon   Chronic Pain / osteoarthritis multiple joints Doing well on Tramadol 50mg  daily PRN recently worse with back and shoulders   Health Maintenance:   Colon CA Screening: Cologuard negative 03/2021, repeat 3 yrs Due 2025     01/12/2024    1:26 PM 03/27/2023    8:55 AM 07/14/2022    9:54 AM  Depression screen PHQ 2/9  Decreased Interest 1 2 1   Down, Depressed, Hopeless  1 1  PHQ - 2 Score 1 3 2   Altered sleeping 1 2 0  Tired, decreased energy 1 2 1   Change in  appetite 0 1 1  Feeling bad or failure about yourself  0 0 0  Trouble concentrating 0 1 0  Moving slowly or fidgety/restless 0 0 1  Suicidal thoughts 0  0  PHQ-9 Score 3 9 5   Difficult doing work/chores Not difficult at all  Somewhat difficult       01/12/2024    1:26 PM 07/14/2022    9:54 AM 06/16/2022   11:39 AM 04/08/2022   10:36 AM  GAD 7 : Generalized Anxiety Score  Nervous, Anxious, on Edge 0 1 1 0  Control/stop worrying 0 0 0 0  Worry too much - different things 0 0 0 0  Trouble relaxing 0 0 0 0  Restless 0 1 1 0  Easily annoyed or irritable 1 1 1 1   Afraid - awful might happen 0 0 0 0  Total GAD 7 Score 1 3 3 1   Anxiety Difficulty Not difficult at all Not difficult at all Not difficult at all Not difficult at all    Social History   Tobacco Use   Smoking status: Former    Current packs/day: 0.00    Average packs/day: 1 pack/day for 30.0 years (30.0 ttl pk-yrs)    Types: Cigarettes    Start date: 05/18/1974    Quit date: 05/18/2004    Years since quitting: 19.6   Smokeless tobacco: Former  Building services engineer status:  Never Used  Substance Use Topics   Alcohol use: No    Alcohol/week: 0.0 standard drinks of alcohol   Drug use: No    Review of Systems Per HPI unless specifically indicated above     Objective:    BP 122/68   Pulse 87   Ht 5' 10.5" (1.791 m)   Wt 239 lb (108.4 kg)   SpO2 98%   BMI 33.81 kg/m   Wt Readings from Last 3 Encounters:  01/12/24 239 lb (108.4 kg)  09/22/23 238 lb (108 kg)  09/15/23 234 lb (106.1 kg)    Physical Exam Vitals and nursing note reviewed.  Constitutional:      General: He is not in acute distress.    Appearance: He is well-developed. He is obese. He is not ill-appearing or diaphoretic.     Comments: Well-appearing, comfortable, cooperative  HENT:     Head: Normocephalic and atraumatic.  Eyes:     General:        Right eye: No discharge.        Left eye: No discharge.     Conjunctiva/sclera: Conjunctivae  normal.  Neck:     Thyroid: No thyromegaly.  Cardiovascular:     Rate and Rhythm: Normal rate and regular rhythm.     Pulses: Normal pulses.     Heart sounds: Normal heart sounds. No murmur heard. Pulmonary:     Effort: Pulmonary effort is normal. No respiratory distress.     Breath sounds: No wheezing or rales.     Comments: Reduced air movement Musculoskeletal:        General: Normal range of motion.     Cervical back: Normal range of motion and neck supple.  Lymphadenopathy:     Cervical: No cervical adenopathy.  Skin:    General: Skin is warm and dry.     Findings: No erythema or rash.  Neurological:     Mental Status: He is alert and oriented to person, place, and time. Mental status is at baseline.  Psychiatric:        Behavior: Behavior normal.     Comments: Well groomed, good eye contact, normal speech and thoughts     Diabetic Foot Exam - Simple   Simple Foot Form Diabetic Foot exam was performed with the following findings: Yes 01/12/2024  2:10 PM  Visual Inspection See comments: Yes Sensation Testing Intact to touch and monofilament testing bilaterally: Yes Pulse Check Posterior Tibialis and Dorsalis pulse intact bilaterally: Yes Comments Left toenail off, now healing. No ulceration of foot. Callus formation. Monofilament intact.      Results for orders placed or performed in visit on 01/12/24  POCT HgB A1C   Collection Time: 01/12/24  1:29 PM  Result Value Ref Range   Hemoglobin A1C 8.8 (A) 4.0 - 5.6 %   HbA1c POC (<> result, manual entry)     HbA1c, POC (prediabetic range)     HbA1c, POC (controlled diabetic range)        Assessment & Plan:   Problem List Items Addressed This Visit     Agitation   Relevant Medications   FLUoxetine (PROZAC) 40 MG capsule   Essential hypertension   Relevant Medications   amLODipine (NORVASC) 5 MG tablet   Osteoarthritis of multiple joints   Relevant Medications   traMADol (ULTRAM) 50 MG tablet   Type 2  diabetes mellitus with peripheral neuropathy (HCC) - Primary   Relevant Medications   MOUNJARO 7.5 MG/0.5ML Pen   LANTUS  SOLOSTAR 100 UNIT/ML Solostar Pen   FLUoxetine (PROZAC) 40 MG capsule   Other Relevant Orders   POCT HgB A1C (Completed)   Other Visit Diagnoses       Screening for colon cancer       Relevant Orders   Cologuard     Acute non-recurrent frontal sinusitis       Relevant Medications   cetirizine (ZYRTEC) 10 MG tablet   amoxicillin-clavulanate (AUGMENTIN) 875-125 MG tablet        Chronic Cough Emphysema Persistent cough with production of sputum. No abnormal lung sounds on auscultation. Possible postnasal drip. -Prescribe Augmentin for possible sinus infection.  Type 2 Diabetes Mellitus A1c of 8.8, slight improved from 9.3  Patient acknowledges dietary indiscretions. Currently on Mounjaro 5mg . -Increase Mounjaro to 7.5mg  for one month, then reassess for possible increase to 10mg . -Check A1c in 4 months.  -Order comprehensive blood work for next Thursday, February 20th, 2025, including chemistry, blood count, cholesterol, and prostate-specific antigen (PSA). -Order urine test to check for proteinuria. -Order Cologuard for colon cancer screening, due in May 2025. -Encourage patient to obtain recent eye exam results from [facility].  Medication Refills Several medications due for refill, including Lantus, fluoxetine, amlodipine, and tramadol. -Refill all medications as discussed. -Check patient's supply of tramadol and refill if necessary.  Follow-up -Return in 4 months for A1c check. -Contact office if patient tolerates Mounjaro 7.5mg  and wishes to increase to 10mg .         Orders Placed This Encounter  Procedures   Cologuard   POCT HgB A1C    Meds ordered this encounter  Medications   MOUNJARO 7.5 MG/0.5ML Pen    Sig: Inject 7.5 mg into the skin once a week.    Dispense:  2 mL    Refill:  0   LANTUS SOLOSTAR 100 UNIT/ML Solostar  Pen    Sig: Inject 70 Units into the skin daily.    Dispense:  30 mL    Refill:  5   amLODipine (NORVASC) 5 MG tablet    Sig: Take 1 tablet (5 mg total) by mouth daily.    Dispense:  90 tablet    Refill:  3   FLUoxetine (PROZAC) 40 MG capsule    Sig: Take 1 capsule (40 mg total) by mouth daily.    Dispense:  90 capsule    Refill:  3   traMADol (ULTRAM) 50 MG tablet    Sig: Take 1 tablet (50 mg total) by mouth every 8 (eight) hours as needed.    Dispense:  30 tablet    Refill:  2   amoxicillin-clavulanate (AUGMENTIN) 875-125 MG tablet    Sig: Take 1 tablet by mouth 2 (two) times daily.    Dispense:  20 tablet    Refill:  0    Follow up plan: Return in about 4 months (around 05/11/2024) for 4 month follow-up DM A1c, COPD.  Future labs ordered for 01/18/24 - No A1c, CMET Lipid CBC PSA TSH, Urine Microalbumin  Saralyn Pilar, DO [facility] 01/12/2024, 1:54 PM

## 2024-01-18 ENCOUNTER — Other Ambulatory Visit: Payer: BC Managed Care – PPO

## 2024-01-18 DIAGNOSIS — Z8042 Family history of malignant neoplasm of prostate: Secondary | ICD-10-CM

## 2024-01-18 DIAGNOSIS — E782 Mixed hyperlipidemia: Secondary | ICD-10-CM

## 2024-01-18 DIAGNOSIS — I10 Essential (primary) hypertension: Secondary | ICD-10-CM

## 2024-01-18 DIAGNOSIS — E1142 Type 2 diabetes mellitus with diabetic polyneuropathy: Secondary | ICD-10-CM

## 2024-01-18 DIAGNOSIS — M150 Primary generalized (osteo)arthritis: Secondary | ICD-10-CM

## 2024-02-10 ENCOUNTER — Other Ambulatory Visit: Payer: Self-pay | Admitting: Family Medicine

## 2024-02-10 DIAGNOSIS — E1142 Type 2 diabetes mellitus with diabetic polyneuropathy: Secondary | ICD-10-CM

## 2024-02-12 NOTE — Telephone Encounter (Signed)
 I declined refill. I asked him to update [place] after 1 month on 7.5mg  if prefer to stay at 7.5 or go to 10mg .  Can you contact patient and follow-up to see which he prefers?  Thank you!  Saralyn Pilar, DO [facility] 02/12/2024, 5:00 PM

## 2024-02-12 NOTE — Telephone Encounter (Signed)
 Requested medication (s) are due for refill today - yes  Requested medication (s) are on the active medication list -yes  Future visit scheduled -yes  Last refill: 01/12/24 2ml  Notes to clinic: off protocol- provider review   Requested Prescriptions  Pending Prescriptions Disp Refills   MOUNJARO 7.5 MG/0.5ML Pen [Pharmacy Med Name: Mounjaro 7.5 MG/0.5ML Subcutaneous Solution Pen-injector] 4 mL 0    Sig: INJECT 7.5 MG SUBCUTANEOUSLY ONCE A WEEK     Off-Protocol Failed - 02/12/2024 11:24 AM      Failed - Medication not assigned to a protocol, review manually.      Passed - Valid encounter within last 12 months    Recent Outpatient Visits           4 months ago Herpes zoster without complication   [place] [facility] [place], Salvadore Oxford, NP   5 months ago Right hand pain   [facility] [place], [place], NP   10 months ago Acute bronchitis with COPD [facility])   [place] [facility] Smitty Cords, DO   1 year ago Acute bronchitis with COPD [facility])   [place] [facility] Smitty Cords, DO   1 year ago COPD exacerbation [facility])   [facility] [place], Salvadore Oxford, NP       Future Appointments             In 3 months Althea Charon, Netta Neat, DO [place] [facility], [facility]               Requested Prescriptions  Pending Prescriptions Disp Refills   MOUNJARO 7.5 MG/0.5ML Pen [Pharmacy Med Name: Mounjaro 7.5 MG/0.5ML Subcutaneous Solution Pen-injector] 4 mL 0    Sig: INJECT 7.5 MG SUBCUTANEOUSLY ONCE A WEEK     Off-Protocol Failed - 02/12/2024 11:24 AM      Failed - Medication not assigned to a protocol, review manually.      Passed - Valid encounter within last 12 months    Recent Outpatient Visits           4 months ago Herpes zoster without complication   [place] [facility] [place], Salvadore Oxford, NP   5 months ago  Right hand pain   [place] [facility] [place], Salvadore Oxford, NP   10 months ago Acute bronchitis with COPD [place] [facility])   [place] [facility] Smitty Cords, DO   1 year ago Acute bronchitis with COPD [facility])   [place] [facility] Smitty Cords, DO   1 year ago COPD exacerbation [facility])   [place] [facility] [place], Salvadore Oxford, NP       Future Appointments             In 3 months Althea Charon, Netta Neat, DO [facility], [facility]

## 2024-02-13 MED ORDER — MOUNJARO 10 MG/0.5ML ~~LOC~~ SOAJ: 10.0000 mg | SUBCUTANEOUS | 1 refills | Status: DC

## 2024-02-13 NOTE — Addendum Note (Signed)
 Addended by: Smitty Cords on: 02/13/2024 12:52 PM   Modules accepted: Orders

## 2024-03-13 ENCOUNTER — Other Ambulatory Visit: Payer: Self-pay | Admitting: Family Medicine

## 2024-03-13 DIAGNOSIS — I10 Essential (primary) hypertension: Secondary | ICD-10-CM

## 2024-03-14 NOTE — Telephone Encounter (Signed)
 Requested Prescriptions  Refused Prescriptions Disp Refills   amLODipine (NORVASC) 5 MG tablet [Pharmacy Med Name: amLODIPine Besylate 5 MG Oral Tablet] 90 tablet 0    Sig: Take 1 tablet by mouth once daily     Cardiovascular: Calcium Channel Blockers 2 Passed - 03/14/2024  9:57 AM      Passed - Last BP in normal range    BP Readings from Last 1 Encounters:  01/12/24 122/68         Passed - Last Heart Rate in normal range    Pulse Readings from Last 1 Encounters:  01/12/24 87         Passed - Valid encounter within last 6 months    Recent Outpatient Visits           2 months ago Type 2 diabetes mellitus with peripheral neuropathy [facility])   [place] [facility] [place], Kayleen Party, DO       Future Appointments             In 2 months [place], Kayleen Party, DO [place] [facility], [facility]

## 2024-04-25 LAB — COLOGUARD: COLOGUARD: NEGATIVE

## 2024-04-26 ENCOUNTER — Ambulatory Visit: Payer: Self-pay | Admitting: Internal Medicine

## 2024-04-27 ENCOUNTER — Other Ambulatory Visit: Payer: Self-pay | Admitting: Family Medicine

## 2024-04-27 DIAGNOSIS — I10 Essential (primary) hypertension: Secondary | ICD-10-CM

## 2024-04-29 NOTE — Telephone Encounter (Signed)
 Requested Prescriptions  Refused Prescriptions Disp Refills   amLODipine  (NORVASC ) 5 MG tablet [Pharmacy Med Name: amLODIPine  Besylate 5 MG Oral Tablet] 90 tablet 0    Sig: Take 1 tablet by mouth once daily     Cardiovascular: Calcium  Channel Blockers 2 Passed - 04/29/2024  3:22 PM      Passed - Last BP in normal range    BP Readings from Last 1 Encounters:  01/12/24 122/68         Passed - Last Heart Rate in normal range    Pulse Readings from Last 1 Encounters:  01/12/24 87         Passed - Valid encounter within last 6 months    Recent Outpatient Visits           3 months ago Type 2 diabetes mellitus with peripheral neuropathy [facility]   [facility] [place], Kayleen Party, DO       Future Appointments             In 2 weeks [place], Kayleen Party, DO [place] [facility], [facility]

## 2024-05-04 ENCOUNTER — Other Ambulatory Visit: Payer: Self-pay | Admitting: Family Medicine

## 2024-05-04 DIAGNOSIS — I10 Essential (primary) hypertension: Secondary | ICD-10-CM

## 2024-05-06 ENCOUNTER — Other Ambulatory Visit: Payer: Self-pay | Admitting: Family Medicine

## 2024-05-06 NOTE — Telephone Encounter (Signed)
 Requested Prescriptions  Pending Prescriptions Disp Refills   losartan  (COZAAR ) 50 MG tablet [Pharmacy Med Name: Losartan  Potassium 50 MG Oral Tablet] 90 tablet 0    Sig: Take 1 tablet by mouth once daily     Cardiovascular:  Angiotensin Receptor Blockers Failed - 05/06/2024  3:20 PM      Failed - Cr in normal range and within 180 days    Creat  Date Value Ref Range Status  03/31/2022 1.05 0.70 - 1.28 mg/dL Final   Creatinine, Ser  Date Value Ref Range Status  06/12/2022 1.07 0.61 - 1.24 mg/dL Final         Failed - K in normal range and within 180 days    Potassium  Date Value Ref Range Status  06/12/2022 3.7 3.5 - 5.1 mmol/L Final         Passed - Patient is not pregnant      Passed - Last BP in normal range    BP Readings from Last 1 Encounters:  01/12/24 122/68         Passed - Valid encounter within last 6 months    Recent Outpatient Visits           3 months ago Type 2 diabetes mellitus with peripheral neuropathy [facility])   [place] [facility] [place], Kayleen Party, DO       Future Appointments             In 1 week [place], Kayleen Party, DO [place] [facility]             amLODipine  (NORVASC ) 5 MG tablet [Pharmacy Med Name: amLODIPine  Besylate 5 MG Oral Tablet] 90 tablet 0    Sig: Take 1 tablet by mouth once daily     Cardiovascular: Calcium  Channel Blockers 2 Passed - 05/06/2024  3:20 PM      Passed - Last BP in normal range    BP Readings from Last 1 Encounters:  01/12/24 122/68         Passed - Last Heart Rate in normal range    Pulse Readings from Last 1 Encounters:  01/12/24 87         Passed - Valid encounter within last 6 months    Recent Outpatient Visits           3 months ago Type 2 diabetes mellitus with peripheral neuropathy [facility])   [place] [facility] [place], Kayleen Party, DO       Future Appointments             In 1 week [place], Kayleen Party, DO  [place] [facility], [facility]

## 2024-05-07 NOTE — Telephone Encounter (Signed)
 Requested Prescriptions  Pending Prescriptions Disp Refills   imipramine  (TOFRANIL ) 10 MG tablet [Pharmacy Med Name: Imipramine  HCl 10 MG Oral Tablet] 90 tablet 0    Sig: TAKE 1 TABLET BY MOUTH AT BEDTIME     Psychiatry:  Antidepressants - Heterocyclics (TCAs) Passed - 05/07/2024 11:55 AM      Passed - Completed PHQ-2 or PHQ-9 in the last 360 days      Passed - Valid encounter within last 6 months    Recent Outpatient Visits           3 months ago Type 2 diabetes mellitus with peripheral neuropathy [facility])   [place] [facility] [place], Kayleen Party, DO       Future Appointments             In 1 week [place], Kayleen Party, DO [place] [facility], [facility]

## 2024-05-09 LAB — OPHTHALMOLOGY REPORT-SCANNED

## 2024-05-16 ENCOUNTER — Ambulatory Visit: Payer: BC Managed Care – PPO | Admitting: Family Medicine

## 2024-05-16 ENCOUNTER — Encounter: Payer: Self-pay | Admitting: Family Medicine

## 2024-05-16 VITALS — BP 140/76 | HR 82 | Ht 70.5 in | Wt 226.1 lb

## 2024-05-16 DIAGNOSIS — E1142 Type 2 diabetes mellitus with diabetic polyneuropathy: Secondary | ICD-10-CM | POA: Diagnosis not present

## 2024-05-16 DIAGNOSIS — I10 Essential (primary) hypertension: Secondary | ICD-10-CM

## 2024-05-16 DIAGNOSIS — Z7985 Long-term (current) use of injectable non-insulin antidiabetic drugs: Secondary | ICD-10-CM | POA: Diagnosis not present

## 2024-05-16 DIAGNOSIS — Z794 Long term (current) use of insulin: Secondary | ICD-10-CM

## 2024-05-16 LAB — POCT GLYCOSYLATED HEMOGLOBIN (HGB A1C): Hemoglobin A1C: 6.1 % — AB (ref 4.0–5.6)

## 2024-05-16 MED ORDER — INSULIN PEN NEEDLE 32G X 6 MM MISC: 3 refills | Status: AC

## 2024-05-16 MED ORDER — AMLODIPINE BESYLATE 5 MG PO TABS: 5.0000 mg | ORAL_TABLET | Freq: Every day | ORAL | 3 refills | Status: AC

## 2024-05-16 MED ORDER — LOSARTAN POTASSIUM 50 MG PO TABS
50.0000 mg | ORAL_TABLET | Freq: Every day | ORAL | 3 refills | Status: DC
Start: 2024-05-16 — End: 2024-10-10

## 2024-05-16 NOTE — Progress Notes (Signed)
 Subjective:    Patient ID: Henry Horne, male    DOB: May 17, 1951, 73 y.o.   MRN: [id number]  Henry Horne is a 73 y.o. male presenting on 05/16/2024 for Diabetes   HPI  Discussed the use of AI scribe software for clinical note transcription with the patient, who gave verbal consent to proceed.  History of Present Illness   [name] is a 73 year old male with diabetes who presents for follow-up on blood sugar management.    CHRONIC DM, Type 2 with history of DM Neuropathy He has experienced a significant improvement in blood sugar levels, with his most recent A1c at 6.1, down from previous values of 9.3 and 8.8. He attributes this improvement to dietary changes, specifically reducing junk food intake during the day, although he continues to consume ice cream and cookies at night. He has also lost weight, currently weighing 226 pounds, down from 266 pounds. No longer using CGM due to adherence issue with it and had reaction on skin. Has glucometer missing test supplies unsure which brand will call us  Meds: Lantus  70u morning, Mounjaro  10mg  weekly, Metformin  1000mg  BID Reports good compliance. Tolerating well w/o side-effects Currently on ARB - Last DM Eye exam at [facility] in [place] - Prior history of nerve testing L>R reduced sensation Admits some numbness tingling feet Denies hypoglycemia  Hypertension Mild elevated today Home readings improved. On medication  History Prostate Cancer Urology Dr Aram Knights in [facility] He underwent a urine check and cystoscopy at Dr. Karlis Overland office, but the specific tests performed were not detailed. He has not had a recent blood panel and plans to have blood and urine tests in October.   Chronic Pain / osteoarthritis multiple joints Doing well on Tramadol  50mg  daily PRN recently worse with back and shoulders No new refill needed   Health Maintenance:   Cologuard 04/19/24 negative, repeat 3 years 2028       05/16/2024   10:57  AM 01/12/2024    1:26 PM 03/27/2023    8:55 AM  Depression screen PHQ 2/9  Decreased Interest 1 1 2   Down, Depressed, Hopeless 0  1  PHQ - 2 Score 1 1 3   Altered sleeping 2 1 2   Tired, decreased energy 3 1 2   Change in appetite  0 1  Feeling bad or failure about yourself  0 0 0  Trouble concentrating  0 1  Moving slowly or fidgety/restless 3 0 0  Suicidal thoughts 0 0   PHQ-9 Score 9 3 9   Difficult doing work/chores Somewhat difficult Not difficult at all        05/16/2024   10:58 AM 01/12/2024    1:26 PM 07/14/2022    9:54 AM 06/16/2022   11:39 AM  GAD 7 : Generalized Anxiety Score  Nervous, Anxious, on Edge 0 0 1 1  Control/stop worrying 1 0 0 0  Worry too much - different things 1 0 0 0  Trouble relaxing 0 0 0 0  Restless 0 0 1 1  Easily annoyed or irritable 1 1 1 1   Afraid - awful might happen 0 0 0 0  Total GAD 7 Score 3 1 3 3   Anxiety Difficulty Somewhat difficult Not difficult at all Not difficult at all Not difficult at all    Social History   Tobacco Use   Smoking status: Former    Current packs/day: 0.00    Average packs/day: 1 pack/day for 30.0 years (30.0 ttl pk-yrs)  Types: Cigarettes    Start date: 05/18/1974    Quit date: 05/18/2004    Years since quitting: 20.0   Smokeless tobacco: Former  Building services engineer status: Never Used  Substance Use Topics   Alcohol use: No    Alcohol/week: 0.0 standard drinks of alcohol   Drug use: No    Review of Systems Per HPI unless specifically indicated above     Objective:    BP (!) 140/76 (BP Location: Left Arm, Cuff Size: Normal)   Pulse 82   Ht 5' 10.5 (1.791 m)   Wt 226 lb 2 oz (102.6 kg)   SpO2 97%   BMI 31.99 kg/m   Wt Readings from Last 3 Encounters:  05/16/24 226 lb 2 oz (102.6 kg)  01/12/24 239 lb (108.4 kg)  09/22/23 238 lb (108 kg)    Physical Exam Vitals and nursing note reviewed.  Constitutional:      General: He is not in acute distress.    Appearance: Normal appearance. He is  well-developed. He is obese. He is not diaphoretic.     Comments: Well-appearing, comfortable, cooperative  HENT:     Head: Normocephalic and atraumatic.   Eyes:     General:        Right eye: No discharge.        Left eye: No discharge.     Conjunctiva/sclera: Conjunctivae normal.    Cardiovascular:     Rate and Rhythm: Normal rate.  Pulmonary:     Effort: Pulmonary effort is normal.   Skin:    General: Skin is warm and dry.     Findings: No erythema or rash.   Neurological:     Mental Status: He is alert and oriented to person, place, and time.   Psychiatric:        Mood and Affect: Mood normal.        Behavior: Behavior normal.        Thought Content: Thought content normal.     Comments: Well groomed, good eye contact, normal speech and thoughts     Recent Labs    01/12/24 1329 05/16/24 1103  HGBA1C 8.8* 6.1*     Results for orders placed or performed in visit on 05/16/24  POCT HgB A1C   Collection Time: 05/16/24 11:03 AM  Result Value Ref Range   Hemoglobin A1C 6.1 (A) 4.0 - 5.6 %   HbA1c POC (<> result, manual entry)     HbA1c, POC (prediabetic range)     HbA1c, POC (controlled diabetic range)        Assessment & Plan:   Problem List Items Addressed This Visit     Essential hypertension   Relevant Medications   amLODipine  (NORVASC ) 5 MG tablet   losartan  (COZAAR ) 50 MG tablet   Other Relevant Orders   CT CARDIAC SCORING (SELF PAY ONLY)   Type 2 diabetes mellitus with peripheral neuropathy (HCC) - Primary   Relevant Medications   Insulin  Pen Needle 32G X 6 MM MISC   losartan  (COZAAR ) 50 MG tablet   Other Relevant Orders   POCT HgB A1C (Completed)   CT CARDIAC SCORING (SELF PAY ONLY)   Other Visit Diagnoses       Long-term current use of injectable noninsulin antidiabetic medication         Long term current use of insulin  (HCC)            Type 2 Diabetes Mellitus HbA1c improved to 6.1, indicating better  glycemic control, likely due to  weight loss. Mounjaro  effectively managing blood glucose. Emphasized importance of monitoring to adjust insulin  dosage. - Reduce Lantus  from 70 to 65 units daily, decrease by 1-2 units weekly if blood glucose levels consistently below 150 mg/dL. - Encourage blood glucose monitoring using available meters at home. - Order pen needles for insulin  administration. - Continue Mounjaro  at 10 mg. - Encourage dietary modifications to further improve glycemic control.  Hypertension Blood pressure medication prescription expired. - Order refills for blood pressure medication.  Chronic Pain Chronic pain managed with tramadol , satisfactory with current regimen. - Continue current tramadol  regimen. - Inform provider if additional pain management is needed. With Tramadol  refills  Coronary Artery Disease Screening Discussed heart CT scan for coronary artery disease screening as a non-invasive option to predict blockages and guide further interventions. - Order heart CT scan for coronary artery disease screening.        Orders Placed This Encounter  Procedures   CT CARDIAC SCORING (SELF PAY ONLY)    Standing Status:   Future    Expiration Date:   05/16/2025    Preferred imaging location?:   [facility]   POCT HgB A1C    Meds ordered this encounter  Medications   Insulin  Pen Needle 32G X 6 MM MISC    Sig: Use to inject insulin  into skin daily.    Dispense:  90 each    Refill:  3   amLODipine  (NORVASC ) 5 MG tablet    Sig: Take 1 tablet (5 mg total) by mouth daily.    Dispense:  90 tablet    Refill:  3   losartan  (COZAAR ) 50 MG tablet    Sig: Take 1 tablet (50 mg total) by mouth daily.    Dispense:  90 tablet    Refill:  3    Follow up plan: Return for 4 month Follow-up DM, updates, fasting labs + Urine micro AFTER.    Domingo Friend, DO [facility], [place] [facility] 05/16/2024, 11:15 AM

## 2024-05-16 NOTE — Patient Instructions (Addendum)
 Thank you for coming to the office today.  Reduce Lantus  from 70 down to 65 u daily and can keep adjusting based on sugars. If consistent < 150 okay to drop dose each week 1-2 units   Recent Labs    01/12/24 1329 05/16/24 1103  HGBA1C 8.8* 6.1*    You have been referred for a Coronary Calcium  Score Cardiac CT Scan. This is a screening test for patients aged 74-50+ with cardiovascular risk factors or who are healthy but would be interested in Cardiovascular Screening for heart disease. Even if there is a family history of heart disease, this imaging can be useful. Typically it can be done every 5+ years or at a different timeline we agree on  The scan will look at the chest and mainly focus on the heart and identify early signs of calcium  build up or blockages within the heart arteries. It is not 100% accurate for identifying blockages or heart disease, but it is useful to help us  predict who may have some early changes or be at risk in the future for a heart attack or cardiovascular problem.  The results are reviewed by a Cardiologist and they will document the results. It should become available on MyChart. Typically the results are divided into percentiles based on other patients of the same demographic and age. So it will compare your risk to others similar to you. If you have a higher score >99 or higher percentile >75%tile, it is recommended to consider Statin cholesterol therapy and or referral to Cardiologist. I will try to help explain your results and if we have questions we can contact the Cardiologist.  You will be contacted for scheduling. Usually it is done at any imaging facility through [facility], [facility] or [facility].  The cost is $99 flat fee total and it does not go through insurance, so no authorization is required.   Please schedule a Follow-up Appointment to: Return for 4 month Follow-up DM, updates, fasting labs + Urine micro  AFTER.  If you have any other questions or concerns, please feel free to call the office or send a message through MyChart. You may also schedule an earlier appointment if necessary.  Additionally, you may be receiving a survey about your experience at our office within a few days to 1 week by e-mail or mail. We value your feedback.  Domingo Friend, DO [facility], [place]

## 2024-05-26 ENCOUNTER — Other Ambulatory Visit: Payer: Self-pay | Admitting: Family Medicine

## 2024-05-28 NOTE — Telephone Encounter (Signed)
 Requested Prescriptions  Pending Prescriptions Disp Refills   metFORMIN  (GLUCOPHAGE ) 1000 MG tablet [Pharmacy Med Name: metFORMIN  HCl 1000 MG Oral Tablet] 180 tablet 0    Sig: TAKE 1 TABLET BY MOUTH TWICE DAILY WITH MEALS     Endocrinology:  Diabetes - Biguanides Failed - 05/28/2024  2:03 PM      Failed - Cr in normal range and within 360 days    Creat  Date Value Ref Range Status  03/31/2022 1.05 0.70 - 1.28 mg/dL Final   Creatinine, Ser  Date Value Ref Range Status  06/12/2022 1.07 0.61 - 1.24 mg/dL Final         Failed - eGFR in normal range and within 360 days    GFR, Est African American  Date Value Ref Range Status  03/18/2021 104 > OR = 60 mL/min/1.63m2 Final   GFR, Est Non African American  Date Value Ref Range Status  03/18/2021 90 > OR = 60 mL/min/1.46m2 Final   GFR, Estimated  Date Value Ref Range Status  06/12/2022 >60 >60 mL/min Final    Comment:    (NOTE) Calculated using the CKD-EPI Creatinine Equation (2021)    eGFR  Date Value Ref Range Status  03/31/2022 76 > OR = 60 mL/min/1.45m2 Final    Comment:    The eGFR is based on the CKD-EPI 2021 equation. To calculate  the new eGFR from a previous Creatinine or Cystatin C result, go to https://www.kidney.org/professionals/ kdoqi/gfr%5Fcalculator          Failed - B12 Level in normal range and within 720 days    Vitamin B-12  Date Value Ref Range Status  06/13/2022 175 (L) 180 - 914 pg/mL Final    Comment:    (NOTE) This assay is not validated for testing neonatal or myeloproliferative syndrome specimens for Vitamin B12 levels. Performed at [facility], [address] [place], [place] [address]          Failed - CBC within normal limits and completed in the last 12 months    WBC  Date Value Ref Range Status  06/12/2022 12.8 (H) 4.0 - 10.5 K/uL Final   RBC  Date Value Ref Range Status  06/12/2022 5.14 4.22 - 5.81 MIL/uL Final   Hemoglobin  Date Value Ref Range Status  06/12/2022 13.2  13.0 - 17.0 g/dL Final   HCT  Date Value Ref Range Status  06/12/2022 41.6 39.0 - 52.0 % Final   MCHC  Date Value Ref Range Status  06/12/2022 31.7 30.0 - 36.0 g/dL Final   [facility]  Date Value Ref Range Status  06/12/2022 25.7 (L) 26.0 - 34.0 pg Final   MCV  Date Value Ref Range Status  06/12/2022 80.9 80.0 - 100.0 fL Final   No results found for: PLTCOUNTKUC, LABPLAT, POCPLA RDW  Date Value Ref Range Status  06/12/2022 13.3 11.5 - 15.5 % Final         Passed - HBA1C is between 0 and 7.9 and within 180 days    Hemoglobin A1C  Date Value Ref Range Status  05/16/2024 6.1 (A) 4.0 - 5.6 % Final   Hgb A1c MFr Bld  Date Value Ref Range Status  03/31/2022 8.9 (H) <5.7 % of total Hgb Final    Comment:    For someone without known diabetes, a hemoglobin A1c value of 6.5% or greater indicates that they may have  diabetes and this should be confirmed with a follow-up  test. . For someone with  known diabetes, a value <7% indicates  that their diabetes is well controlled and a value  greater than or equal to 7% indicates suboptimal  control. A1c targets should be individualized based on  duration of diabetes, age, comorbid conditions, and  other considerations. . Currently, no consensus exists regarding use of hemoglobin A1c for diagnosis of diabetes for children. SABRA Amy - Valid encounter within last 6 months    Recent Outpatient Visits           1 week ago Type 2 diabetes mellitus with peripheral neuropathy [facility])   [place] [facility] [place], Marsa PARAS, DO   4 months ago Type 2 diabetes mellitus with peripheral neuropathy [facility])   [place] [facility] [place], Marsa PARAS, [place]

## 2024-06-06 ENCOUNTER — Ambulatory Visit
Admission: RE | Admit: 2024-06-06 | Discharge: 2024-06-06 | Disposition: A | Discharge status: Home / Self Care | Payer: Self-pay | Source: Ambulatory Visit | Attending: Family Medicine | Admitting: Family Medicine

## 2024-06-06 DIAGNOSIS — E1142 Type 2 diabetes mellitus with diabetic polyneuropathy: Secondary | ICD-10-CM | POA: Insufficient documentation

## 2024-06-06 DIAGNOSIS — I10 Essential (primary) hypertension: Secondary | ICD-10-CM | POA: Insufficient documentation

## 2024-06-07 ENCOUNTER — Ambulatory Visit: Payer: Self-pay | Admitting: Family Medicine

## 2024-06-07 DIAGNOSIS — E782 Mixed hyperlipidemia: Secondary | ICD-10-CM

## 2024-06-07 DIAGNOSIS — E1142 Type 2 diabetes mellitus with diabetic polyneuropathy: Secondary | ICD-10-CM

## 2024-06-07 DIAGNOSIS — R931 Abnormal findings on diagnostic imaging of heart and coronary circulation: Secondary | ICD-10-CM

## 2024-06-25 ENCOUNTER — Other Ambulatory Visit: Payer: Self-pay | Admitting: Family Medicine

## 2024-06-25 DIAGNOSIS — M150 Primary generalized (osteo)arthritis: Secondary | ICD-10-CM

## 2024-06-25 MED ORDER — TRAMADOL HCL 50 MG PO TABS: 50.0000 mg | ORAL_TABLET | Freq: Three times a day (TID) | ORAL | 2 refills | Status: AC | PRN

## 2024-07-05 ENCOUNTER — Encounter: Payer: Self-pay | Admitting: Cardiology

## 2024-07-05 ENCOUNTER — Ambulatory Visit: Attending: Cardiology | Admitting: Cardiology

## 2024-07-05 VITALS — BP 145/73 | HR 75 | Ht 70.5 in | Wt 225.6 lb

## 2024-07-05 DIAGNOSIS — I2510 Atherosclerotic heart disease of native coronary artery without angina pectoris: Secondary | ICD-10-CM | POA: Diagnosis not present

## 2024-07-05 DIAGNOSIS — I10 Essential (primary) hypertension: Secondary | ICD-10-CM

## 2024-07-05 DIAGNOSIS — E7800 Pure hypercholesterolemia, unspecified: Secondary | ICD-10-CM

## 2024-07-05 MED ORDER — EZETIMIBE 10 MG PO TABS: 10.0000 mg | ORAL_TABLET | Freq: Every day | ORAL | 3 refills | Status: AC

## 2024-07-05 NOTE — Patient Instructions (Signed)
 Medication Instructions:  - START zetia  10 mg daily   *If you need a refill on your cardiac medications before your next appointment, please call your pharmacy*  Lab Work: No labs ordered today  If you have labs (blood work) drawn today and your tests are completely normal, you will receive your results only by: MyChart Message (if you have MyChart) OR A paper copy in the mail If you have any lab test that is abnormal or we need to change your treatment, we will call you to review the results.  Testing/Procedures: Your physician has requested that you have an echocardiogram. Echocardiography is a painless test that uses sound waves to create images of your heart. It provides your doctor with information about the size and shape of your heart and how well your heart's chambers and valves are working.   You may receive an ultrasound enhancing agent through an IV if needed to better visualize your heart during the echo. This procedure takes approximately one hour.  There are no restrictions for this procedure.  This will take place at [address] [facility] [address] [facility] [address], [place] [address]  Please note: We ask at that you not bring children with you during ultrasound (echo/ vascular) testing. Due to room size and safety concerns, children are not allowed in the ultrasound rooms during exams. Our front office staff cannot provide observation of children in our lobby area while testing is being conducted. An adult accompanying a patient to their appointment will only be allowed in the ultrasound room at the discretion of the ultrasound technician under special circumstances. We apologize for any inconvenience.   Follow-Up: At [facility], you and your health needs are our priority.  As part of our continuing mission to provide you with exceptional heart care, our providers are all part of one team.  This team includes your primary Cardiologist (physician) and Advanced Practice  Providers or APPs (Physician Assistants and Nurse Practitioners) who all work together to provide you with the care you need, when you need it.  Your next appointment:   3 month(s)  Provider:   You may see Dr. Darliss or one of the following Advanced Practice Providers on your designated Care Team:   Lonni Meager, NP Lesley Maffucci, PA-C Bernardino Bring, PA-C Cadence Norton, PA-C Tylene Lunch, NP Barnie Hila, NP    We recommend signing up for the patient portal called MyChart.  Sign up information is provided on this After Visit Summary.  MyChart is used to connect with patients for Virtual Visits (Telemedicine).  Patients are able to view lab/test results, encounter notes, upcoming appointments, etc.  Non-urgent messages can be sent to your provider as well.   To learn more about what you can do with MyChart, go to [email or web address].

## 2024-07-05 NOTE — Progress Notes (Signed)
 Cardiology Office Note:    Date:  07/05/2024   ID:  Henry Horne, DOB [birth year], MRN [id number]  PCP:  Edman Marsa PARAS, DO   [facility] Providers Cardiologist:  None     Referring MD: Edman Marsa *   Chief Complaint  Patient presents with   Establish Care    New pt has been doing well with no complaints of chest pain, chest pressure or SOB, medciation reviewed verbally with patient    Henry Horne is a 73 y.o. male who is being seen today for the evaluation of CAD at the request of Edman Marsa *.   History of Present Illness:    Henry Horne is a 73 y.o. male with a hx of CAD (LAD, LCx calcifications, calcium  score 425, 63rd percentile ), Hypertension, hyperlipidemia, diabetes, former smoker x 25 years who presents due to coronary calcifications.  Patient had a coronary calcium  score obtained 06/06/2024, score of 425, 63rd percentile.  He denies chest pain or shortness of breath.  States having intolerance to statins.  Has not tried Zetia  before.  Does not check BP at home.  Compliant with BP meds as prescribed.  Past Medical History:  Diagnosis Date   Anxiety    Cancer of kidney (HCC) 11/28/2008   cystoscopy 2010, 2014, 2015, 2016- MD q 6 months   Depression    Emphysema of lung (HCC) 11/28/1994   Hyperlipidemia    Hypertension    Joint pain    Prostate cancer (HCC) 11/28/2006   removed with cryotherapy   SDH (subdural hematoma) (HCC)     Past Surgical History:  Procedure Laterality Date   DIGIT NAIL REMOVAL Left 2016   left and right foot -toe nails    Current Medications: Current Meds  Medication Sig   albuterol  (VENTOLIN  HFA) 108 (90 Base) MCG/ACT inhaler INHALE 1 TO 2 PUFFS BY MOUTH EVERY 4 HOURS AS NEEDED FOR WHEEZING FOR SHORTNESS OF BREATH   amLODipine  (NORVASC ) 5 MG tablet Take 1 tablet (5 mg total) by mouth daily.   aspirin  EC 81 MG tablet Take 81 mg by mouth daily. Swallow whole.   BREZTRI  AEROSPHERE  160-9-4.8 MCG/ACT AERO Inhale 2 puffs into the lungs 2 (two) times daily.   cetirizine (ZYRTEC) 10 MG tablet Take 10 mg by mouth daily.   diclofenac  sodium (VOLTAREN ) 1 % GEL Apply 2 g topically 3 (three) times daily as needed. For knees   ezetimibe  (ZETIA ) 10 MG tablet Take 1 tablet (10 mg total) by mouth daily.   FLUoxetine  (PROZAC ) 40 MG capsule Take 1 capsule (40 mg total) by mouth daily.   fluticasone  (FLONASE ) 50 MCG/ACT nasal spray Place into the nose. Place 2 sprays into one nostril once daily as needed   Garlic  Oil 2 MG CAPS Take 1,000 mg by mouth 2 (two) times daily. Reported on 11/17/2015   imipramine  (TOFRANIL ) 10 MG tablet TAKE 1 TABLET BY MOUTH AT BEDTIME   Insulin  Pen Needle 32G X 6 MM MISC Use to inject insulin  into skin daily.   LANTUS  SOLOSTAR 100 UNIT/ML Solostar Pen Inject 70 Units into the skin daily.   losartan  (COZAAR ) 50 MG tablet Take 1 tablet (50 mg total) by mouth daily.   metFORMIN  (GLUCOPHAGE ) 1000 MG tablet TAKE 1 TABLET BY MOUTH TWICE DAILY WITH MEALS   MOUNJARO  10 MG/0.5ML Pen Inject 10 mg into the skin once a week.   omeprazole  (PRILOSEC) 40 MG capsule Take 1 capsule (40 mg total) by mouth  daily.   traMADol  (ULTRAM ) 50 MG tablet Take 1 tablet (50 mg total) by mouth every 8 (eight) hours as needed.     Allergies:   Ace inhibitors, Pneumococcal vaccine, Pneumococcal vaccines, and Statins   Social History   Socioeconomic History   Marital status: Married    Spouse name: Arland   Number of children: Not on file   Years of education: Not on file   Highest education level: Not on file  Occupational History   Not on file  Tobacco Use   Smoking status: Former    Current packs/day: 0.00    Average packs/day: 1 pack/day for 30.0 years (30.0 ttl pk-yrs)    Types: Cigarettes    Start date: 05/18/1974    Quit date: 05/18/2004    Years since quitting: 20.1   Smokeless tobacco: Former  Building services engineer status: Never Used  Substance and Sexual Activity    Alcohol use: No    Alcohol/week: 0.0 standard drinks of alcohol   Drug use: No   Sexual activity: Not on file  Other Topics Concern   Not on file  Social History Narrative   Not on file   Social Drivers of Health   Financial Resource Strain: Not on file  Food Insecurity: Not on file  Transportation Needs: Not on file  Physical Activity: Not on file  Stress: Not on file  Social Connections: Unknown (09/25/2020)   [facility]   Social Connections    Frequency of Communication with Friends and Family: Not asked    Frequency of Social Gatherings with Friends and Family: Not asked     Family History: The patient's family history includes Autoimmune disease in his brother; COPD in his brother; Cancer in his father; Diabetes in his brother, brother, and mother; Heart disease in his brother and father; Hyperlipidemia in his father; Hypertension in his father.  ROS:   Please see the history of present illness.     All other systems reviewed and are negative.  EKGs/Labs/Other Studies Reviewed:    The following studies were reviewed today:  EKG Interpretation Date/Time:  Friday July 05 2024 08:54:43 EDT Ventricular Rate:  75 PR Interval:    QRS Duration:  76 QT Interval:  392 QTC Calculation: 437 R Axis:   -38  Text Interpretation: Accelerated Junctional rhythm Left axis deviation Inferior infarct , age undetermined Confirmed by Darliss Rogue ([id number]) on 07/05/2024 9:03:07 AM    Recent Labs: No results found for requested labs within last 365 days.  Recent Lipid Panel    Component Value Date/Time   CHOL 174 06/13/2022 0204   CHOL 197 11/02/2015 1015   TRIG 83 06/13/2022 0204   HDL 37 (L) 06/13/2022 0204   HDL 34 (L) 11/02/2015 1015   CHOLHDL 4.7 06/13/2022 0204   VLDL 17 06/13/2022 0204   LDLCALC 120 (H) 06/13/2022 0204   LDLCALC 117 (H) 03/31/2022 0800     Risk Assessment/Calculations:         Physical Exam:    VS:  BP (!) 145/73 (BP  Location: Left Arm, Patient Position: Sitting, Cuff Size: Normal)   Pulse 75   Ht 5' 10.5 (1.791 m)   Wt 225 lb 9.6 oz (102.3 kg)   SpO2 98%   BMI 31.91 kg/m     Wt Readings from Last 3 Encounters:  07/05/24 225 lb 9.6 oz (102.3 kg)  05/16/24 226 lb 2 oz (102.6 kg)  01/12/24 239 lb (108.4 kg)  GEN:  Well nourished, well developed in no acute distress HEENT: Normal NECK: No JVD; No carotid bruits CARDIAC: RRR, no murmurs, rubs, gallops RESPIRATORY:  Clear to auscultation without rales, wheezing or rhonchi  ABDOMEN: Soft, non-tender, non-distended MUSCULOSKELETAL:  No edema; No deformity  SKIN: Warm and dry NEUROLOGIC:  Alert and oriented x 3 PSYCHIATRIC:  Normal affect   ASSESSMENT:    1. Coronary artery disease involving native heart without angina pectoris, unspecified vessel or lesion type   2. Pure hypercholesterolemia   3. Essential hypertension    PLAN:    In order of problems listed above:  CAD (LAD, LCx calcification),  calcium  score 425, 63rd percentile.  Denies chest pain.  Obtain echocardiogram.  Aspirin  81 mg daily, start Zetia  10 mg daily.  Patient intolerant to statins. Hyperlipidemia, Zetia  10 mg daily as above. Hypertension, BP elevated.  Continue losartan  50 mg daily, Norvasc  5 mg daily.  Advised to check BP at home and keep a log, titrate losartan  and follow-up visit if BP elevated.  Follow-up after echo.     Medication Adjustments/Labs and Tests Ordered: Current medicines are reviewed at length with the patient today.  Concerns regarding medicines are outlined above.  Orders Placed This Encounter  Procedures   EKG 12-Lead   ECHOCARDIOGRAM COMPLETE   Meds ordered this encounter  Medications   ezetimibe  (ZETIA ) 10 MG tablet    Sig: Take 1 tablet (10 mg total) by mouth daily.    Dispense:  90 tablet    Refill:  3    Patient Instructions  Medication Instructions:  - START zetia  10 mg daily   *If you need a refill on your cardiac  medications before your next appointment, please call your pharmacy*  Lab Work: No labs ordered today  If you have labs (blood work) drawn today and your tests are completely normal, you will receive your results only by: MyChart Message (if you have MyChart) OR A paper copy in the mail If you have any lab test that is abnormal or we need to change your treatment, we will call you to review the results.  Testing/Procedures: Your physician has requested that you have an echocardiogram. Echocardiography is a painless test that uses sound waves to create images of your heart. It provides your doctor with information about the size and shape of your heart and how well your heart's chambers and valves are working.   You may receive an ultrasound enhancing agent through an IV if needed to better visualize your heart during the echo. This procedure takes approximately one hour.  There are no restrictions for this procedure.  This will take place at [address] [facility] [address] [facility]) [address], [place] [address]  Please note: We ask at that you not bring children with you during ultrasound (echo/ vascular) testing. Due to room size and safety concerns, children are not allowed in the ultrasound rooms during exams. Our front office staff cannot provide observation of children in our lobby area while testing is being conducted. An adult accompanying a patient to their appointment will only be allowed in the ultrasound room at the discretion of the ultrasound technician under special circumstances. We apologize for any inconvenience.   Follow-Up: At [facility], you and your health needs are our priority.  As part of our continuing mission to provide you with exceptional heart care, our providers are all part of one team.  This team includes your primary Cardiologist (physician) and Advanced Practice Providers or  APPs (Physician Assistants and Nurse Practitioners) who all work together to  provide you with the care you need, when you need it.  Your next appointment:   3 month(s)  Provider:   You may see Dr. Darliss or one of the following Advanced Practice Providers on your designated Care Team:   Lonni Meager, NP Lesley Maffucci, PA-C Bernardino Bring, PA-C Cadence [place], PA-C Tylene Lunch, NP Barnie Hila, NP    We recommend signing up for the patient portal called MyChart.  Sign up information is provided on this After Visit Summary.  MyChart is used to connect with patients for Virtual Visits (Telemedicine).  Patients are able to view lab/test results, encounter notes, upcoming appointments, etc.  Non-urgent messages can be sent to your provider as well.   To learn more about what you can do with MyChart, go to [email or web address].          Signed, Redell Darliss, MD  07/05/2024 10:03 AM    [facility]

## 2024-07-20 ENCOUNTER — Other Ambulatory Visit: Payer: Self-pay | Admitting: Family Medicine

## 2024-07-22 NOTE — Telephone Encounter (Signed)
 Requested Prescriptions  Pending Prescriptions Disp Refills   imipramine  (TOFRANIL ) 10 MG tablet [Pharmacy Med Name: Imipramine  HCl 10 MG Oral Tablet] 90 tablet 0    Sig: TAKE 1 TABLET BY MOUTH AT BEDTIME     Psychiatry:  Antidepressants - Heterocyclics (TCAs) Passed - 07/22/2024  4:08 PM      Passed - Completed PHQ-2 or PHQ-9 in the last 360 days      Passed - Valid encounter within last 6 months    Recent Outpatient Visits           2 months ago Type 2 diabetes mellitus with peripheral neuropathy [facility])   [place] [facility], Marsa PARAS, DO   6 months ago Type 2 diabetes mellitus with peripheral neuropathy [facility])   [place] [facility] Edman Marsa PARAS, DO       Future Appointments             In 2 months Agbor-Etang, Redell, MD [facility] at [facility]

## 2024-07-23 ENCOUNTER — Other Ambulatory Visit: Payer: Self-pay | Admitting: Family Medicine

## 2024-07-23 DIAGNOSIS — E1142 Type 2 diabetes mellitus with diabetic polyneuropathy: Secondary | ICD-10-CM

## 2024-07-24 NOTE — Telephone Encounter (Signed)
 Requested medication (s) are due for refill today: yes  Requested medication (s) are on the active medication list: yes  Last refill:  02/13/24 6 ml 1 RF  Future visit scheduled: yes  Notes to clinic:  med not assigned to a protocol   Requested Prescriptions  Pending Prescriptions Disp Refills   MOUNJARO  10 MG/0.5ML Pen [Pharmacy Med Name: Mounjaro  10 MG/0.5ML Subcutaneous Solution Pen-injector] 12 mL 0    Sig: INJECT 10MG  SUBCUTANEOUSLY  ONCE A WEEK     Off-Protocol Failed - 07/24/2024 11:33 AM      Failed - Medication not assigned to a protocol, review manually.      Passed - Valid encounter within last 12 months    Recent Outpatient Visits           2 months ago Type 2 diabetes mellitus with peripheral neuropathy [facility])   [place] [facility] [place], Marsa PARAS, DO   6 months ago Type 2 diabetes mellitus with peripheral neuropathy [facility])   [place] [facility] Edman Marsa PARAS, DO       Future Appointments             In 2 months Agbor-Etang, Redell, MD [facility] at [facility]

## 2024-08-22 ENCOUNTER — Ambulatory Visit: Attending: Cardiology

## 2024-08-22 DIAGNOSIS — I10 Essential (primary) hypertension: Secondary | ICD-10-CM | POA: Diagnosis not present

## 2024-08-22 LAB — ECHOCARDIOGRAM COMPLETE
AR max vel: 2.58 cm2
AV Area VTI: 2.94 cm2
AV Area mean vel: 2.55 cm2
AV Mean grad: 5 mmHg
AV Peak grad: 9 mmHg
Ao pk vel: 1.5 m/s
Area-P 1/2: 4.68 cm2
S' Lateral: 2.95 cm

## 2024-08-23 ENCOUNTER — Ambulatory Visit: Payer: Self-pay | Admitting: Cardiology

## 2024-09-08 ENCOUNTER — Other Ambulatory Visit: Payer: Self-pay | Admitting: Family Medicine

## 2024-09-09 ENCOUNTER — Other Ambulatory Visit: Payer: Self-pay

## 2024-09-09 MED ORDER — METFORMIN HCL 1000 MG PO TABS: 1000.0000 mg | ORAL_TABLET | Freq: Two times a day (BID) | ORAL | 0 refills | Status: DC

## 2024-09-10 NOTE — Telephone Encounter (Signed)
 Too soon for refill, LRF 09/09/24.  Requested Prescriptions  Pending Prescriptions Disp Refills   metFORMIN  (GLUCOPHAGE ) 1000 MG tablet [Pharmacy Med Name: metFORMIN  HCl 1000 MG Oral Tablet] 180 tablet 0    Sig: TAKE 1 TABLET BY MOUTH TWICE DAILY WITH MEALS     Endocrinology:  Diabetes - Biguanides Failed - 09/10/2024  2:12 PM      Failed - Cr in normal range and within 360 days    Creat  Date Value Ref Range Status  03/31/2022 1.05 0.70 - 1.28 mg/dL Final   Creatinine, Ser  Date Value Ref Range Status  06/12/2022 1.07 0.61 - 1.24 mg/dL Final         Failed - eGFR in normal range and within 360 days    GFR, Est African American  Date Value Ref Range Status  03/18/2021 104 > OR = 60 mL/min/1.14m2 Final   GFR, Est Non African American  Date Value Ref Range Status  03/18/2021 90 > OR = 60 mL/min/1.64m2 Final   GFR, Estimated  Date Value Ref Range Status  06/12/2022 >60 >60 mL/min Final    Comment:    (NOTE) Calculated using the CKD-EPI Creatinine Equation (2021)    eGFR  Date Value Ref Range Status  03/31/2022 76 > OR = 60 mL/min/1.76m2 Final    Comment:    The eGFR is based on the CKD-EPI 2021 equation. To calculate  the new eGFR from a previous Creatinine or Cystatin C result, go to https://www.kidney.org/professionals/ kdoqi/gfr%5Fcalculator          Failed - B12 Level in normal range and within 720 days    Vitamin B-12  Date Value Ref Range Status  06/13/2022 175 (L) 180 - 914 pg/mL Final    Comment:    (NOTE) This assay is not validated for testing neonatal or myeloproliferative syndrome specimens for Vitamin B12 levels. Performed at [facility], [address], [place], [place] [address]          Failed - CBC within normal limits and completed in the last 12 months    WBC  Date Value Ref Range Status  06/12/2022 12.8 (H) 4.0 - 10.5 K/uL Final   RBC  Date Value Ref Range Status  06/12/2022 5.14 4.22 - 5.81 MIL/uL Final   Hemoglobin  Date  Value Ref Range Status  06/12/2022 13.2 13.0 - 17.0 g/dL Final   HCT  Date Value Ref Range Status  06/12/2022 41.6 39.0 - 52.0 % Final   MCHC  Date Value Ref Range Status  06/12/2022 31.7 30.0 - 36.0 g/dL Final   [facility]  Date Value Ref Range Status  06/12/2022 25.7 (L) 26.0 - 34.0 pg Final   MCV  Date Value Ref Range Status  06/12/2022 80.9 80.0 - 100.0 fL Final   No results found for: PLTCOUNTKUC, LABPLAT, POCPLA RDW  Date Value Ref Range Status  06/12/2022 13.3 11.5 - 15.5 % Final         Passed - HBA1C is between 0 and 7.9 and within 180 days    Hemoglobin A1C  Date Value Ref Range Status  05/16/2024 6.1 (A) 4.0 - 5.6 % Final   Hgb A1c MFr Bld  Date Value Ref Range Status  03/31/2022 8.9 (H) <5.7 % of total Hgb Final    Comment:    For someone without known diabetes, a hemoglobin A1c value of 6.5% or greater indicates that they may have  diabetes and this should be confirmed with a  follow-up  test. . For someone with known diabetes, a value <7% indicates  that their diabetes is well controlled and a value  greater than or equal to 7% indicates suboptimal  control. A1c targets should be individualized based on  duration of diabetes, age, comorbid conditions, and  other considerations. . Currently, no consensus exists regarding use of hemoglobin A1c for diagnosis of diabetes for children. SABRA Amy - Valid encounter within last 6 months    Recent Outpatient Visits           3 months ago Type 2 diabetes mellitus with peripheral neuropathy [facility])   [place] [facility] [place], Marsa PARAS, DO   8 months ago Type 2 diabetes mellitus with peripheral neuropathy [facility])   [place] [facility] Edman Marsa PARAS, DO       Future Appointments             In 1 month Agbor-Etang, Redell, MD [facility] at [facility]

## 2024-09-19 ENCOUNTER — Ambulatory Visit: Admitting: Family Medicine

## 2024-10-03 ENCOUNTER — Ambulatory Visit: Admitting: Family Medicine

## 2024-10-10 ENCOUNTER — Ambulatory Visit: Admitting: Family Medicine

## 2024-10-10 ENCOUNTER — Ambulatory Visit: Attending: Cardiology | Admitting: Cardiology

## 2024-10-10 ENCOUNTER — Encounter: Payer: Self-pay | Admitting: Cardiology

## 2024-10-10 ENCOUNTER — Encounter: Payer: Self-pay | Admitting: Family Medicine

## 2024-10-10 VITALS — BP 168/70 | HR 77 | Ht 70.5 in | Wt 222.4 lb

## 2024-10-10 VITALS — BP 142/70 | HR 79 | Ht 70.5 in | Wt 221.5 lb

## 2024-10-10 DIAGNOSIS — E7800 Pure hypercholesterolemia, unspecified: Secondary | ICD-10-CM

## 2024-10-10 DIAGNOSIS — I10 Essential (primary) hypertension: Secondary | ICD-10-CM

## 2024-10-10 DIAGNOSIS — M150 Primary generalized (osteo)arthritis: Secondary | ICD-10-CM | POA: Diagnosis not present

## 2024-10-10 DIAGNOSIS — I2510 Atherosclerotic heart disease of native coronary artery without angina pectoris: Secondary | ICD-10-CM | POA: Diagnosis not present

## 2024-10-10 DIAGNOSIS — E782 Mixed hyperlipidemia: Secondary | ICD-10-CM | POA: Diagnosis not present

## 2024-10-10 DIAGNOSIS — Z7985 Long-term (current) use of injectable non-insulin antidiabetic drugs: Secondary | ICD-10-CM | POA: Diagnosis not present

## 2024-10-10 DIAGNOSIS — Z794 Long term (current) use of insulin: Secondary | ICD-10-CM

## 2024-10-10 DIAGNOSIS — E1142 Type 2 diabetes mellitus with diabetic polyneuropathy: Secondary | ICD-10-CM | POA: Diagnosis not present

## 2024-10-10 DIAGNOSIS — G720 Drug-induced myopathy: Secondary | ICD-10-CM

## 2024-10-10 DIAGNOSIS — Z8551 Personal history of malignant neoplasm of bladder: Secondary | ICD-10-CM

## 2024-10-10 DIAGNOSIS — F418 Other specified anxiety disorders: Secondary | ICD-10-CM

## 2024-10-10 MED ORDER — IMIPRAMINE HCL 10 MG PO TABS: 10.0000 mg | ORAL_TABLET | Freq: Every day | ORAL | 3 refills | Status: AC

## 2024-10-10 MED ORDER — LOSARTAN POTASSIUM 100 MG PO TABS: 100.0000 mg | ORAL_TABLET | Freq: Every day | ORAL | 3 refills | Status: AC

## 2024-10-10 MED ORDER — METFORMIN HCL 1000 MG PO TABS: 1000.0000 mg | ORAL_TABLET | Freq: Two times a day (BID) | ORAL | 3 refills | Status: AC

## 2024-10-10 MED ORDER — MOUNJARO 10 MG/0.5ML ~~LOC~~ SOAJ: 10.0000 mg | SUBCUTANEOUS | 1 refills | Status: AC

## 2024-10-10 NOTE — Patient Instructions (Signed)
 Medication Instructions:  Your physician recommends that you continue on your current medications as directed. Please refer to the Current Medication list given to you today.   *If you need a refill on your cardiac medications before your next appointment, please call your pharmacy*  Lab Work: No labs ordered today  If you have labs (blood work) drawn today and your tests are completely normal, you will receive your results only by: MyChart Message (if you have MyChart) OR A paper copy in the mail If you have any lab test that is abnormal or we need to change your treatment, we will call you to review the results.  Testing/Procedures: No test ordered today   Follow-Up: At [facility], you and your health needs are our priority.  As part of our continuing mission to provide you with exceptional heart care, our providers are all part of one team.  This team includes your primary Cardiologist (physician) and Advanced Practice Providers or APPs (Physician Assistants and Nurse Practitioners) who all work together to provide you with the care you need, when you need it.  Your next appointment:   3 month(s)  Provider:   You may see Dr. Darliss or one of the following Advanced Practice Providers on your designated Care Team:   Lonni Meager, NP Lesley Maffucci, PA-C Bernardino Bring, PA-C Cadence [place], PA-C Tylene Lunch, NP Barnie Hila, NP    We recommend signing up for the patient portal called MyChart.  Sign up information is provided on this After Visit Summary.  MyChart is used to connect with patients for Virtual Visits (Telemedicine).  Patients are able to view lab/test results, encounter notes, upcoming appointments, etc.  Non-urgent messages can be sent to your provider as well.   To learn more about what you can do with MyChart, go to [email or web address].

## 2024-10-10 NOTE — Progress Notes (Signed)
 Cardiology Office Note:    Date:  10/10/2024   ID:  Henry Horne, DOB Jul 28, 1951, MRN [id number]  PCP:  Edman Marsa PARAS, DO   [facility] Providers Cardiologist:  None     Referring MD: Edman Marsa *   Chief Complaint  Patient presents with   Follow-up    3 month follow up visit. Patient is doing well on today. Meds reviewed.     History of Present Illness:    Henry Horne is a 73 y.o. male with a hx of CAD (LAD, LCx calcifications, calcium  score 425, 63rd percentile ), Hypertension, hyperlipidemia, diabetes, former smoker x 25 years who presents for follow-up.    He was previously seen due to coronary calcifications and hypertension.  Denies chest pain.  Started on aspirin  and Zetia , tolerating meds, no adverse effects.  BP was elevated at home, primary care physician increase losartan  to 100 mg daily.  Patient has not taken increased dose of losartan .   Past Medical History:  Diagnosis Date   Anxiety    Cancer of kidney (HCC) 11/28/2008   cystoscopy 2010, 2014, 2015, 2016- MD q 6 months   Depression    Emphysema of lung (HCC) 11/28/1994   Hyperlipidemia    Hypertension    Joint pain    Prostate cancer (HCC) 11/28/2006   removed with cryotherapy   SDH (subdural hematoma) (HCC)     Past Surgical History:  Procedure Laterality Date   DIGIT NAIL REMOVAL Left 2016   left and right foot -toe nails    Current Medications: Current Meds  Medication Sig   albuterol  (VENTOLIN  HFA) 108 (90 Base) MCG/ACT inhaler INHALE 1 TO 2 PUFFS BY MOUTH EVERY 4 HOURS AS NEEDED FOR WHEEZING FOR SHORTNESS OF BREATH   amLODipine  (NORVASC ) 5 MG tablet Take 1 tablet (5 mg total) by mouth daily.   aspirin  EC 81 MG tablet Take 81 mg by mouth daily. Swallow whole.   BREZTRI  AEROSPHERE 160-9-4.8 MCG/ACT AERO Inhale 2 puffs into the lungs 2 (two) times daily.   cetirizine (ZYRTEC) 10 MG tablet Take 10 mg by mouth daily.   diclofenac  sodium (VOLTAREN ) 1 % GEL  Apply 2 g topically 3 (three) times daily as needed. For knees   ezetimibe  (ZETIA ) 10 MG tablet Take 1 tablet (10 mg total) by mouth daily.   FLUoxetine  (PROZAC ) 40 MG capsule Take 1 capsule (40 mg total) by mouth daily.   fluticasone  (FLONASE ) 50 MCG/ACT nasal spray Place into the nose. Place 2 sprays into one nostril once daily as needed   Garlic  Oil 2 MG CAPS Take 1,000 mg by mouth 2 (two) times daily. Reported on 11/17/2015   imipramine  (TOFRANIL ) 10 MG tablet Take 1 tablet (10 mg total) by mouth at bedtime.   Insulin  Pen Needle 32G X 6 MM MISC Use to inject insulin  into skin daily.   LANTUS  SOLOSTAR 100 UNIT/ML Solostar Pen Inject 70 Units into the skin daily.   losartan  (COZAAR ) 100 MG tablet Take 1 tablet (100 mg total) by mouth daily.   metFORMIN  (GLUCOPHAGE ) 1000 MG tablet Take 1 tablet (1,000 mg total) by mouth 2 (two) times daily with a meal.   MOUNJARO  10 MG/0.5ML Pen Inject 10 mg into the skin once a week.   omeprazole  (PRILOSEC) 40 MG capsule Take 1 capsule (40 mg total) by mouth daily.   traMADol  (ULTRAM ) 50 MG tablet Take 1 tablet (50 mg total) by mouth every 8 (eight) hours as needed.  Allergies:   Ace inhibitors, Pneumococcal vaccine, Pneumococcal vaccines, and Statins   Social History   Socioeconomic History   Marital status: Married    Spouse name: Arland   Number of children: Not on file   Years of education: Not on file   Highest education level: Associate degree: occupational, scientist, product/process development, or vocational program  Occupational History   Not on file  Tobacco Use   Smoking status: Former    Current packs/day: 0.00    Average packs/day: 1 pack/day for 30.0 years (30.0 ttl pk-yrs)    Types: Cigarettes    Start date: 05/18/1974    Quit date: 05/18/2004    Years since quitting: 20.4   Smokeless tobacco: Former  Building Services Engineer status: Never Used  Substance and Sexual Activity   Alcohol use: No    Alcohol/week: 0.0 standard drinks of alcohol   Drug use: No    Sexual activity: Not on file  Other Topics Concern   Not on file  Social History Narrative   Not on file   Social Drivers of Health   Financial Resource Strain: Medium Risk (10/09/2024)   Overall Financial Resource Strain (CARDIA)    Difficulty of Paying Living Expenses: Somewhat hard  Food Insecurity: Food Insecurity Present (10/09/2024)   Hunger Vital Sign    Worried About Running Out of Food in the Last Year: Sometimes true    Ran Out of Food in the Last Year: Sometimes true  Transportation Needs: No Transportation Needs (10/09/2024)   PRAPARE - Administrator, Civil Service (Medical): No    Lack of Transportation (Non-Medical): No  Physical Activity: Not on file  Stress: Stress Concern Present (10/09/2024)   [employer] of Occupational Health - Occupational Stress Questionnaire    Feeling of Stress: To some extent  Social Connections: Socially Isolated (10/09/2024)   Social Connection and Isolation Panel    Frequency of Communication with Friends and Family: Once a week    Frequency of Social Gatherings with Friends and Family: Once a week    Attends Religious Services: Never    Database Administrator or Organizations: No    Attends Engineer, Structural: Not on file    Marital Status: Married     Family History: The patient's family history includes Autoimmune disease in his brother; COPD in his brother; Cancer in his father; Diabetes in his brother, brother, and mother; Heart disease in his brother and father; Hyperlipidemia in his father; Hypertension in his father.  ROS:   Please see the history of present illness.     All other systems reviewed and are negative.  EKGs/Labs/Other Studies Reviewed:    The following studies were reviewed today:       Recent Labs: No results found for requested labs within last 365 days.  Recent Lipid Panel    Component Value Date/Time   CHOL 174 06/13/2022 0204   CHOL 197 11/02/2015 1015   TRIG 83  06/13/2022 0204   HDL 37 (L) 06/13/2022 0204   HDL 34 (L) 11/02/2015 1015   CHOLHDL 4.7 06/13/2022 0204   VLDL 17 06/13/2022 0204   LDLCALC 120 (H) 06/13/2022 0204   LDLCALC 117 (H) 03/31/2022 0800     Risk Assessment/Calculations:         Physical Exam:    VS:  BP (!) 168/70   Pulse 77   Ht 5' 10.5 (1.791 m)   Wt 222 lb 6.4 oz (100.9 kg)  SpO2 95%   BMI 31.46 kg/m     Wt Readings from Last 3 Encounters:  10/10/24 222 lb 6.4 oz (100.9 kg)  10/10/24 221 lb 8 oz (100.5 kg)  07/05/24 225 lb 9.6 oz (102.3 kg)     GEN:  Well nourished, well developed in no acute distress HEENT: Normal NECK: No JVD; No carotid bruits CARDIAC: RRR, no murmurs, rubs, gallops RESPIRATORY:  Clear to auscultation without rales, wheezing or rhonchi  ABDOMEN: Soft, non-tender, non-distended MUSCULOSKELETAL:  No edema; No deformity  SKIN: Warm and dry NEUROLOGIC:  Alert and oriented x 3 PSYCHIATRIC:  Normal affect   ASSESSMENT:    1. Coronary artery disease involving native heart without angina pectoris, unspecified vessel or lesion type   2. Pure hypercholesterolemia   3. Essential hypertension     PLAN:    In order of problems listed above:  CAD (LAD, LCx calcification),  calcium  score 425, 63rd percentile.  Denies chest pain.  echocardiogram 9/25 EF 55 to 60%.  Continue aspirin  81 mg daily, Zetia  10 mg daily.  Patient intolerant to statins. Hyperlipidemia, Zetia  10 mg daily as above. Hypertension, BP elevated.  Agree with increasing losartan  to 100 mg daily.  Continue Norvasc  5 mg daily.  Monitor BP at home and keep a log.  Follow-up in 3 months     Medication Adjustments/Labs and Tests Ordered: Current medicines are reviewed at length with the patient today.  Concerns regarding medicines are outlined above.  No orders of the defined types were placed in this encounter.  No orders of the defined types were placed in this encounter.   Patient Instructions  Medication  Instructions:  Your physician recommends that you continue on your current medications as directed. Please refer to the Current Medication list given to you today.   *If you need a refill on your cardiac medications before your next appointment, please call your pharmacy*  Lab Work: No labs ordered today  If you have labs (blood work) drawn today and your tests are completely normal, you will receive your results only by: MyChart Message (if you have MyChart) OR A paper copy in the mail If you have any lab test that is abnormal or we need to change your treatment, we will call you to review the results.  Testing/Procedures: No test ordered today   Follow-Up: At [facility], you and your health needs are our priority.  As part of our continuing mission to provide you with exceptional heart care, our providers are all part of one team.  This team includes your primary Cardiologist (physician) and Advanced Practice Providers or APPs (Physician Assistants and Nurse Practitioners) who all work together to provide you with the care you need, when you need it.  Your next appointment:   3 month(s)  Provider:   You may see Dr Darliss or one of the following Advanced Practice Providers on your designated Care Team:   Lonni Meager, NP Lesley Maffucci, PA-C Bernardino Bring, PA-C Cadence Cabery, PA-C Tylene Lunch, NP Barnie Hila, NP    We recommend signing up for the patient portal called MyChart.  Sign up information is provided on this After Visit Summary.  MyChart is used to connect with patients for Virtual Visits (Telemedicine).  Patients are able to view lab/test results, encounter notes, upcoming appointments, etc.  Non-urgent messages can be sent to your provider as well.   To learn more about what you can do with MyChart, go to [email or web address].  Signed, Redell Cave, MD  10/10/2024 10:54 AM    [facility]

## 2024-10-10 NOTE — Progress Notes (Signed)
 Subjective:    Patient ID: Henry Horne, male    DOB: 1951/06/08, 73 y.o.   MRN: [id number]  Henry Horne is a 73 y.o. male presenting on 10/10/2024 for Medical Management of Chronic Issues and Diabetes   HPI  Discussed the use of AI scribe software for clinical note transcription with the patient, who gave verbal consent to proceed.  History of Present Illness   Henry Horne is a 73 year old male with hypertension and coronary artery disease who presents for a follow-up visit.  Coronary artery disease Hyperlipidemia Drug Induced Myopathy - Elevated calcium  score identified on heart scan in July 2025 - Echocardiogram performed for further evaluation - Cardiology consultation completed in August 2025 - Unable to tolerate statins - Currently taking aspirin  and Zetia  for management  Hypertension - Managed with amlodipine  5 mg and losartan  50 mg - Recent blood pressure readings in the 140s/60s range - No issues with medication adherence - No reported side effects from antihypertensive medications - Occasional home blood pressure monitoring  CHRONIC DM, Type 2 with history of DM Neuropathy Improved control Meds: Lantus  70u morning, Mounjaro  10mg  weekly, Metformin  1000mg  BID Reports good compliance. Tolerating well w/o side-effects Currently on ARB - Last DM Eye exam at [facility] in [place] - Prior history of nerve testing L>R reduced sensation Admits some numbness tingling feet Denies hypoglycemia     History Prostate Cancer History of Bladder Cancer Followed by Urology Dr Ike in [place]  On Surveillance   Chronic Pain / osteoarthritis multiple joints Doing well on Tramadol  50mg  daily PRN recently worse with back and shoulders No new refill needed  Depression/ Anxiety / Insomnia Managed on current therapy On imipramine  at night   Health Maintenance:   Cologuard 04/19/24 negative, repeat 3 years 2028  Declines Vaccines  UTD Flu  Shot     10/10/2024    8:03 AM 05/16/2024   10:57 AM 01/12/2024    1:26 PM  Depression screen PHQ 2/9  Decreased Interest 0 1 1  Down, Depressed, Hopeless 1 0   PHQ - 2 Score 1 1 1   Altered sleeping 0 2 1  Tired, decreased energy 1 3 1   Change in appetite 0  0  Feeling bad or failure about yourself  0 0 0  Trouble concentrating 0  0  Moving slowly or fidgety/restless 0 3 0  Suicidal thoughts 0 0 0  PHQ-9 Score 2 9  3    Difficult doing work/chores Somewhat difficult Somewhat difficult Not difficult at all     Data saved with a previous flowsheet row definition       10/10/2024    8:03 AM 05/16/2024   10:58 AM 01/12/2024    1:26 PM 07/14/2022    9:54 AM  GAD 7 : Generalized Anxiety Score  Nervous, Anxious, on Edge 0 0 0 1  Control/stop worrying 0 1 0 0  Worry too much - different things 0 1 0 0  Trouble relaxing 0 0 0 0  Restless 0 0 0 1  Easily annoyed or irritable 0 1 1 1   Afraid - awful might happen 0 0 0 0  Total GAD 7 Score 0 3 1 3   Anxiety Difficulty Not difficult at all Somewhat difficult Not difficult at all Not difficult at all    Social History   Tobacco Use   Smoking status: Former    Current packs/day: 0.00    Average packs/day: 1 pack/day for 30.0 years (30.0  ttl pk-yrs)    Types: Cigarettes    Start date: 05/18/1974    Quit date: 05/18/2004    Years since quitting: 20.4   Smokeless tobacco: Former  Building Services Engineer status: Never Used  Substance Use Topics   Alcohol use: No    Alcohol/week: 0.0 standard drinks of alcohol   Drug use: No     Review of Systems  Constitutional:  Negative for activity change, appetite change, chills, diaphoresis, fatigue and fever.  HENT:  Negative for congestion and hearing loss.   Eyes:  Negative for visual disturbance.  Respiratory:  Negative for cough, chest tightness, shortness of breath and wheezing.   Cardiovascular:  Negative for chest pain, palpitations and leg swelling.  Gastrointestinal:  Negative for  abdominal pain, constipation, diarrhea, nausea and vomiting.  Genitourinary:  Negative for dysuria, frequency and hematuria.  Musculoskeletal:  Positive for arthralgias. Negative for neck pain.  Skin:  Negative for rash.  Neurological:  Negative for dizziness, weakness, light-headedness, numbness and headaches.  Hematological:  Negative for adenopathy.  Psychiatric/Behavioral:  Negative for behavioral problems, dysphoric mood and sleep disturbance.    Per HPI unless specifically indicated above     Objective:    BP (!) 142/70 (BP Location: Left Arm, Cuff Size: Normal)   Pulse 79   Ht 5' 10.5 (1.791 m)   Wt 221 lb 8 oz (100.5 kg)   SpO2 100%   BMI 31.33 kg/m   Wt Readings from Last 3 Encounters:  10/10/24 222 lb 6.4 oz (100.9 kg)  10/10/24 221 lb 8 oz (100.5 kg)  07/05/24 225 lb 9.6 oz (102.3 kg)    Physical Exam Vitals and nursing note reviewed.  Constitutional:      General: He is not in acute distress.    Appearance: He is well-developed. He is obese. He is not diaphoretic.     Comments: Well-appearing, comfortable, cooperative  HENT:     Head: Normocephalic and atraumatic.  Eyes:     General:        Right eye: No discharge.        Left eye: No discharge.     Conjunctiva/sclera: Conjunctivae normal.     Pupils: Pupils are equal, round, and reactive to light.  Neck:     Thyroid : No thyromegaly.     Vascular: No carotid bruit.  Cardiovascular:     Rate and Rhythm: Normal rate and regular rhythm.     Pulses: Normal pulses.     Heart sounds: Normal heart sounds. No murmur heard. Pulmonary:     Effort: Pulmonary effort is normal. No respiratory distress.     Breath sounds: Normal breath sounds. No wheezing or rales.  Abdominal:     General: Bowel sounds are normal. There is no distension.     Palpations: Abdomen is soft. There is no mass.     Tenderness: There is no abdominal tenderness.  Musculoskeletal:        General: No tenderness. Normal range of motion.      Cervical back: Normal range of motion and neck supple.     Right lower leg: No edema.     Left lower leg: No edema.     Comments: Upper / Lower Extremities: - Normal muscle tone, strength bilateral upper extremities 5/5, lower extremities 5/5  Lymphadenopathy:     Cervical: No cervical adenopathy.  Skin:    General: Skin is warm and dry.     Findings: No erythema or rash.  Neurological:  Mental Status: He is alert and oriented to person, place, and time.     Comments: Distal sensation intact to light touch all extremities  Psychiatric:        Mood and Affect: Mood normal.        Behavior: Behavior normal.        Thought Content: Thought content normal.     Comments: Well groomed, good eye contact, normal speech and thoughts     I have personally reviewed the radiology report from 06/06/24 on CT [facility].  ADDENDUM REPORT: 06/09/2024 06:10   EXAM: OVER-READ INTERPRETATION  CT CHEST   The following report is an over-read performed by radiologist Dr. Ester Imam [facility] on 06/09/2024. This over-read does not include interpretation of cardiac or coronary anatomy or pathology. The coronary calcium  score interpretation by the cardiologist is attached.   COMPARISON:  07/28/2022   FINDINGS: Cardiovascular: The heart is normal in size.   Mediastinum/Nodes: Visualized esophagus and trachea within normal limits. No mediastinal lymphadenopathy.   Lungs/Pleura: Unchanged punctate calcified granuloma in the left lower lobe. The visualized lungs are otherwise clear bilaterally.   Upper Abdomen: The visualized upper abdomen is within normal limits.   Musculoskeletal: No acute osseous abnormality. Multilevel degenerative changes of the visualized thoracic spine.   IMPRESSION: No acute extracardiac abnormality.   Ester Sides, MD   [facility]   [facility]     Electronically Signed   By: Ester Sides M.D.    On: 06/09/2024 06:10  Results for orders placed or performed in visit on 08/22/24  ECHOCARDIOGRAM COMPLETE   Collection Time: 08/22/24  8:47 AM  Result Value Ref Range   AR max vel 2.58 cm2   AV Peak grad 9.0 mmHg   Ao pk vel 1.50 m/s   S' Lateral 2.95 cm   Area-P 1/2 4.68 cm2   AV Area VTI 2.94 cm2   AV Mean grad 5.0 mmHg   AV Area mean vel 2.55 cm2   Est EF 55 - 60%       Assessment & Plan:   Problem List Items Addressed This Visit     Depression with anxiety   Relevant Medications   imipramine  (TOFRANIL ) 10 MG tablet   Drug-induced myopathy   Essential hypertension   Relevant Medications   losartan  (COZAAR ) 100 MG tablet   Other Relevant Orders   CBC with Differential/Platelet   Comprehensive metabolic panel with GFR   HLD (hyperlipidemia)   Relevant Medications   losartan  (COZAAR ) 100 MG tablet   Other Relevant Orders   Lipid panel   TSH   Osteoarthritis of multiple joints   Relevant Orders   CBC with Differential/Platelet   Personal history of bladder cancer   Type 2 diabetes mellitus with peripheral neuropathy (HCC) - Primary   Relevant Medications   losartan  (COZAAR ) 100 MG tablet   metFORMIN  (GLUCOPHAGE ) 1000 MG tablet   imipramine  (TOFRANIL ) 10 MG tablet   MOUNJARO  10 MG/0.5ML Pen   Other Relevant Orders   Hemoglobin A1c   Microalbumin / creatinine urine ratio   Other Visit Diagnoses       Long-term current use of injectable noninsulin antidiabetic medication         Long term current use of insulin  (HCC)            Essential hypertension Blood pressure consistently in 140s systolic. Current regimen includes amlodipine  and losartan . Systolic pressure above target. - Increased losartan  50 to 100 mg  daily. - Continue Amlodipine  5mg  daily - Instructed home blood pressure monitoring. - Advised cardiology consultation for management.  Type 2 diabetes mellitus with diabetic polyneuropathy Diabetes managed with metformin  and Mounjaro . No recent  glucose updates. Last A1c controlled - Ordered routine blood work including glucose levels. - Keep current Mounjaro  10mg  weekly and Metformin  1000mg  BID - Refilled metformin  and Mounjaro  prescriptions . Mixed hyperlipidemia Drug Induced Myopathy Intolerance to statins. Managed with aspirin  and Zetia . Recent echocardiogram good. - Continue aspirin  and Zetia . - Ordered cholesterol panel.  Depression / Anxiety Insomnia On Imipramine  10mg  bedtime  Osteoarthritis multiple joints Chronic Joint Pain Goal to avoid NSAID Managed on Tramadol  PRN  General Health Maintenance Routine health maintenance discussed. Flu shot updated. Pneumonia and shingles vaccines not taken. Eye exam status unclear. - Ordered routine blood and urine work including PSA, thyroid , blood count, and cholesterol. - Coordinated with eye exam provider. - Scheduled follow-up in six months.        Orders Placed This Encounter  Procedures   Lipid panel    Has the patient fasted?:   Yes   Hemoglobin A1c   CBC with Differential/Platelet   Microalbumin / creatinine urine ratio   TSH   Comprehensive metabolic panel with GFR    Has the patient fasted?:   Yes    Meds ordered this encounter  Medications   losartan  (COZAAR ) 100 MG tablet    Sig: Take 1 tablet (100 mg total) by mouth daily.    Dispense:  90 tablet    Refill:  3    Dose increase 50 to 100mg    metFORMIN  (GLUCOPHAGE ) 1000 MG tablet    Sig: Take 1 tablet (1,000 mg total) by mouth 2 (two) times daily with a meal.    Dispense:  180 tablet    Refill:  3   imipramine  (TOFRANIL ) 10 MG tablet    Sig: Take 1 tablet (10 mg total) by mouth at bedtime.    Dispense:  90 tablet    Refill:  3   MOUNJARO  10 MG/0.5ML Pen    Sig: Inject 10 mg into the skin once a week.    Dispense:  6 mL    Refill:  1    Follow up plan: Return for 6 month DM A1c, HTN.   Marsa Officer, DO [facility] 10/10/2024, 8:13  AM

## 2024-10-10 NOTE — Patient Instructions (Addendum)
 Thank you for coming to the office today.  Medications are ordered  Labs ordered pending results  Double dose Losartan  50mg  x 2 = 100mg  daily   Check BP regularly now see how you do with the BP, goal < 140 on systolic top number  Ask Cardiology as well.  Please schedule a Follow-up Appointment to: Return for 6 month DM A1c.  If you have any other questions or concerns, please feel free to call the office or send a message through MyChart. You may also schedule an earlier appointment if necessary.  Additionally, you may be receiving a survey about your experience at our office within a few days to 1 week by e-mail or mail. We value your feedback.  Marsa Officer, DO [facility], [place]

## 2024-10-11 LAB — COMPREHENSIVE METABOLIC PANEL WITH GFR
AG Ratio: 2 (calc) (ref 1.0–2.5)
ALT: 16 U/L (ref 9–46)
AST: 17 U/L (ref 10–35)
Albumin: 4.3 g/dL (ref 3.6–5.1)
Alkaline phosphatase (APISO): 69 U/L (ref 35–144)
BUN: 18 mg/dL (ref 7–25)
CO2: 26 mmol/L (ref 20–32)
Calcium: 9.4 mg/dL (ref 8.6–10.3)
Chloride: 104 mmol/L (ref 98–110)
Creat: 0.93 mg/dL (ref 0.70–1.28)
Globulin: 2.2 g/dL (ref 1.9–3.7)
Glucose, Bld: 80 mg/dL (ref 65–99)
Potassium: 5 mmol/L (ref 3.5–5.3)
Sodium: 141 mmol/L (ref 135–146)
Total Bilirubin: 0.5 mg/dL (ref 0.2–1.2)
Total Protein: 6.5 g/dL (ref 6.1–8.1)
eGFR: 86 mL/min/1.73m2 (ref >=60)

## 2024-10-11 LAB — CBC WITH DIFFERENTIAL/PLATELET
Absolute Lymphocytes: 1256 {cells}/uL (ref 850–3900)
Absolute Monocytes: 816 {cells}/uL (ref 200–950)
Basophils Absolute: 104 {cells}/uL (ref 0–200)
Basophils Relative: 1.3 %
Eosinophils Absolute: 432 {cells}/uL (ref 15–500)
Eosinophils Relative: 5.4 %
HCT: 46.2 % (ref 38.5–50.0)
Hemoglobin: 14.9 g/dL (ref 13.2–17.1)
MCH: 27.5 pg (ref 27.0–33.0)
MCHC: 32.3 g/dL (ref 32.0–36.0)
MCV: 85.4 fL (ref 80.0–100.0)
MPV: 9.7 fL (ref 7.5–12.5)
Monocytes Relative: 10.2 %
Neutro Abs: 5392 {cells}/uL (ref 1500–7800)
Neutrophils Relative %: 67.4 %
Platelets: 280 Thousand/uL (ref 140–400)
RBC: 5.41 Million/uL (ref 4.20–5.80)
RDW: 13.2 % (ref 11.0–15.0)
Total Lymphocyte: 15.7 %
WBC: 8 Thousand/uL (ref 3.8–10.8)

## 2024-10-11 LAB — MICROALBUMIN / CREATININE URINE RATIO
Creatinine, Urine: 126 mg/dL (ref 20–320)
Microalb Creat Ratio: 33 mg/g{creat} — ABNORMAL HIGH (ref <30)
Microalb, Ur: 4.2 mg/dL

## 2024-10-11 LAB — TSH: TSH: 1.4 m[IU]/L (ref 0.40–4.50)

## 2024-10-11 LAB — LIPID PANEL
Cholesterol: 151 mg/dL (ref <200)
HDL: 39 mg/dL — ABNORMAL LOW (ref >=40)
LDL Cholesterol (Calc): 90 mg/dL
Non-HDL Cholesterol (Calc): 112 mg/dL (ref <130)
Total CHOL/HDL Ratio: 3.9 (calc) (ref <5.0)
Triglycerides: 128 mg/dL (ref <150)

## 2024-10-11 LAB — HEMOGLOBIN A1C
Hgb A1c MFr Bld: 6.1 % — ABNORMAL HIGH (ref <5.7)
Mean Plasma Glucose: 128 mg/dL
eAG (mmol/L): 7.1 mmol/L

## 2024-10-17 ENCOUNTER — Ambulatory Visit: Payer: Self-pay | Admitting: Family Medicine

## 2024-10-28 ENCOUNTER — Other Ambulatory Visit: Payer: Self-pay | Admitting: Family Medicine

## 2024-10-28 DIAGNOSIS — E1142 Type 2 diabetes mellitus with diabetic polyneuropathy: Secondary | ICD-10-CM

## 2024-10-31 NOTE — Telephone Encounter (Signed)
 Requested Prescriptions  Pending Prescriptions Disp Refills   LANTUS  SOLOSTAR 100 UNIT/ML Solostar Pen [Pharmacy Med Name: Lantus  SoloStar 100 UNIT/ML Subcutaneous Solution Pen-injector] 30 mL 0    Sig: INJECT 70 UNITS SUBCUTANEOUSLY ONCE DAILY     Endocrinology:  Diabetes - Insulins Passed - 10/31/2024 11:02 AM      Passed - HBA1C is between 0 and 7.9 and within 180 days    Hgb A1c MFr Bld  Date Value Ref Range Status  10/10/2024 6.1 (H) <5.7 % Final    Comment:    For someone without known diabetes, a hemoglobin  A1c value between 5.7% and 6.4% is consistent with prediabetes and should be confirmed with a  follow-up test. . For someone with known diabetes, a value <7% indicates that their diabetes is well controlled. A1c targets should be individualized based on duration of diabetes, age, comorbid conditions, and other considerations. . This assay result is consistent with an increased risk of diabetes. . Currently, no consensus exists regarding use of hemoglobin A1c for diagnosis of diabetes for children. SABRA Amy - Valid encounter within last 6 months    Recent Outpatient Visits           3 weeks ago Type 2 diabetes mellitus with peripheral neuropathy [facility] [address])   [facility], Marsa PARAS, DO   5 months ago Type 2 diabetes mellitus with peripheral neuropathy [facility])   [facility] [place] Marsa PARAS, DO   9 months ago Type 2 diabetes mellitus with peripheral neuropathy [facility])   [facility] Ed[place] Marsa PARAS, DO       Future Appointments             In 2 months Agbor-Etang, Redell, MD [facility]

## 2024-11-29 ENCOUNTER — Encounter: Payer: Self-pay | Admitting: Student

## 2024-11-29 ENCOUNTER — Other Ambulatory Visit: Payer: Self-pay | Admitting: Student

## 2024-11-29 ENCOUNTER — Ambulatory Visit: Admitting: Student

## 2024-11-29 VITALS — BP 148/90 | HR 87 | Temp 98.2°F | Ht 70.5 in | Wt 219.0 lb

## 2024-11-29 DIAGNOSIS — J209 Acute bronchitis, unspecified: Secondary | ICD-10-CM

## 2024-11-29 LAB — POC COVID19/FLU A&B COMBO
Covid Antigen, POC: NEGATIVE
Influenza A Antigen, POC: NEGATIVE
Influenza B Antigen, POC: NEGATIVE

## 2024-11-29 MED ORDER — PROMETHAZINE-DM 6.25-15 MG/5ML PO SYRP: 5.0000 mL | ORAL_SOLUTION | Freq: Four times a day (QID) | ORAL | 0 refills | Status: AC | PRN

## 2024-11-29 NOTE — Progress Notes (Signed)
 "  Established Patient Office Visit  Subjective   Patient ID: Henry Horne, male    DOB: 1951-04-17  Age: 74 y.o. MRN: [id number]  Chief Complaint  Patient presents with   Cough    Started on Tuesday with coughing, runny nose, mucus - yellowish     Henry Horne is a 74 y.o. person with medical hx listed below who presents today for cough for the last 3 days. Also reports runny nose and congestion. Cough producing yellow sputum. Works at [employer] and thinks he was exposed at work. Report he has had Flu and COVID vaccine this year and RSV vaccine last year.  He denies fevers, chills, dyspnea, wheezing, n/v/d, abdominal pain, dizziness, hemoptysis.  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   Obesity (BMI 30.0-34.9) 01/17/2018   Osteoarthritis of multiple joints 10/04/2017   Asthma 12/30/2016   ACE-inhibitor cough 12/30/2016   Agitation 06/16/2016   Type 2 diabetes mellitus with peripheral neuropathy (HCC) 12/25/2015   Hyperlipidemia associated with type 2 diabetes mellitus (HCC) 11/02/2015   Asymptomatic bacteriuria 10/08/2015   Essential hypertension 07/29/2015   Urinary obstruction, not elsewhere classified 06/19/2013   Personal history of prostate cancer 12/19/2012   Family history of malignant neoplasm of prostate 12/12/2012   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   aspirin  EC 81 MG tablet Take 81 mg by mouth daily. Swallow whole.   BREZTRI  AEROSPHERE 160-9-4.8 MCG/ACT AERO Inhale 2 puffs into the lungs 2 (two) times daily.   cetirizine (ZYRTEC) 10 MG tablet Take 10 mg by mouth daily.   diclofenac  sodium (VOLTAREN ) 1 % GEL Apply 2 g topically 3 (three) times daily as needed. For knees   ezetimibe  (ZETIA ) 10 MG tablet Take 1 tablet (10 mg total) by mouth daily.   FLUoxetine  (PROZAC ) 40 MG capsule Take 1 capsule (40 mg total) by mouth daily.   fluticasone  (FLONASE ) 50 MCG/ACT nasal spray Place into the nose. Place 2 sprays into one nostril once daily as needed   Garlic  Oil 2 MG CAPS Take 1,000 mg by mouth 2 (two) times daily. Reported on 11/17/2015   imipramine  (TOFRANIL ) 10 MG tablet Take 1 tablet (10 mg total) by mouth at bedtime.   Insulin  Pen Needle 32G X 6 MM MISC Use to inject insulin  into skin daily.   LANTUS  SOLOSTAR 100 UNIT/ML Solostar Pen INJECT 70 UNITS SUBCUTANEOUSLY ONCE DAILY   losartan  (COZAAR ) 100 MG tablet Take 1 tablet (100 mg total) by mouth daily.   metFORMIN  (GLUCOPHAGE ) 1000 MG tablet Take 1 tablet (1,000 mg total) by mouth 2 (two) times daily with a meal.   MOUNJARO  10 MG/0.5ML Pen Inject 10 mg into the skin once a week.   omeprazole  (PRILOSEC) 40 MG capsule Take 1 capsule (40  mg total) by mouth daily.   promethazine -dextromethorphan (PROMETHAZINE -DM) 6.25-15 MG/5ML syrup Take 5 mLs by mouth 4 (four) times daily as needed for up to 9 days for cough.   traMADol  (ULTRAM ) 50 MG tablet Take 1 tablet (50 mg total) by mouth every 8 (eight) hours as needed.   No facility-administered encounter medications on file as of 11/29/2024.    BP (!) 148/90   Pulse 87   Temp 98.2 F (36.8 C) (Oral)   Ht 5' 10.5 (1.791 m)   Wt 219 lb (99.3 kg)   SpO2 97%   BMI 30.98 kg/m  BP Readings from Last 3 Encounters:  11/29/24 (!) 148/90  10/10/24 (!) 168/70  10/10/24 (!) 142/70    Physical  Exam Constitutional:      Appearance: Normal appearance.  HENT:     Head: Normocephalic and atraumatic.     Mouth/Throat:     Mouth: Mucous membranes are moist.     Pharynx: Oropharynx is clear.  Eyes:     Extraocular Movements: Extraocular movements intact.     Conjunctiva/sclera: Conjunctivae normal.     Pupils: Pupils are equal, round, and reactive to light.  Cardiovascular:     Rate and Rhythm: Normal rate and regular rhythm.     Heart sounds: No murmur heard. Pulmonary:     Effort: Pulmonary effort is normal. No respiratory distress.     Breath sounds: Normal breath sounds. No wheezing or rales.  Abdominal:     General: Abdomen is flat. Bowel sounds are normal. There is no distension.     Palpations: Abdomen is soft.     Tenderness: There is no abdominal tenderness.  Musculoskeletal:        General: Normal range of motion.     Right lower leg: No edema.     Left lower leg: No edema.  Skin:    General: Skin is warm and dry.     Capillary Refill: Capillary refill takes less than 2 seconds.  Neurological:     General: No focal deficit present.     Mental Status: He is alert and oriented to person, place, and time.  Psychiatric:        Mood and Affect: Mood normal.        Behavior: Behavior normal.        10/10/2024    8:03 AM 05/16/2024   10:57 AM 01/12/2024    1:26 PM  Depression screen PHQ 2/9  Decreased Interest 0 1 1  Down, Depressed, Hopeless 1 0   PHQ - 2 Score 1 1 1   Altered sleeping 0 2 1  Tired, decreased energy 1 3 1   Change in appetite 0  0  Feeling bad or failure about yourself  0 0 0  Trouble concentrating 0  0  Moving slowly or fidgety/restless 0 3 0  Suicidal thoughts 0 0 0  PHQ-9 Score 2 9  3    Difficult doing work/chores Somewhat difficult Somewhat difficult Not difficult at all     Data saved with a previous flowsheet row definition       10/10/2024    8:03 AM 05/16/2024   10:58 AM 01/12/2024    1:26 PM 07/14/2022    9:54 AM  GAD 7 :  Generalized Anxiety Score  Nervous, Anxious, on Edge 0 0 0 1  Control/stop worrying 0 1 0 0  Worry too much - different things 0 1 0 0  Trouble relaxing 0 0 0 0  Restless 0 0  0 1  Easily annoyed or irritable 0 1 1 1   Afraid - awful might happen 0 0 0 0  Total GAD 7 Score 0 3 1 3   Anxiety Difficulty Not difficult at all Somewhat difficult Not difficult at all Not difficult at all    Results for orders placed or performed in visit on 11/29/24  POC Covid19/Flu A&B Antigen  Result Value Ref Range   Influenza A Antigen, POC Negative Negative   Influenza B Antigen, POC Negative Negative   Covid Antigen, POC Negative Negative      The 10-year ASCVD risk score (Arnett DK, et al., 2019) is: 53.7%    Assessment & Plan:  Acute bronchitis, unspecified organism Negative for flu and covid. Suspect viral bronchitis. Does not appear in COPD exacerbation. Symptomatic treatment with promethazine  cough syrup and coricidin. Return precautions reviewed. -     POC Covid19/Flu A&B Antigen  Other orders -     Promethazine -DM; Take 5 mLs by mouth 4 (four) times daily as needed for up to 9 days for cough.  Dispense: 118 mL; Refill: 0     No follow-ups on file.    Harlene Saddler, MD "

## 2024-11-30 ENCOUNTER — Other Ambulatory Visit: Payer: Self-pay | Admitting: Family Medicine

## 2024-11-30 DIAGNOSIS — J4520 Mild intermittent asthma, uncomplicated: Secondary | ICD-10-CM

## 2024-12-02 NOTE — Telephone Encounter (Signed)
 Requested Prescriptions  Pending Prescriptions Disp Refills   albuterol  (VENTOLIN  HFA) 108 (90 Base) MCG/ACT inhaler [Pharmacy Med Name: Albuterol  Sulfate HFA 108 (90 Base) MCG/ACT Inhalation Aerosol Solution] 7 g 0    Sig: INHALE 1 TO 2 PUFFS BY MOUTH EVERY 4 HOURS AS NEEDED FOR WHEEZING FOR SHORTNESS OF BREATH     Pulmonology:  Beta Agonists 2 Failed - 12/02/2024  4:04 PM      Failed - Last BP in normal range    BP Readings from Last 1 Encounters:  11/29/24 (!) 148/90         Passed - Last Heart Rate in normal range    Pulse Readings from Last 1 Encounters:  11/29/24 87         Passed - Valid encounter within last 12 months    Recent Outpatient Visits           3 days ago Acute bronchitis, unspecified organism   [facility] at [facility], Harlene, MD   1 month ago Type 2 diabetes mellitus with peripheral neuropathy [facility])   [facility] [place], Marsa PARAS, DO   6 months ago Type 2 diabetes mellitus with peripheral neuropathy [facility])   [place] [facility] [place], Marsa PARAS, DO   10 months ago Type 2 diabetes mellitus with peripheral neuropathy [facility])   [place] [facility] [place] Marsa PARAS, DO       Future Appointments             In 1 month Agbor-Etang, Redell, MD [facility]

## 2024-12-02 NOTE — Telephone Encounter (Signed)
 Requested medication (s) are due for refill today: Pharmacy comment: interaction with pt's Fluoxetine ....are you ok with this interaction and want to proceed??   Requested medication (s) are on the active medication list: yes  Last refill:  11/30/23  Future visit scheduled: yes  Notes to clinic:  Pharmacy comment: interaction with pt's Fluoxetine .SABRA..are you ok with this interaction and want to proceed??      Requested Prescriptions  Pending Prescriptions Disp Refills   promethazine -dextromethorphan (PROMETHAZINE -DM) 6.25-15 MG/5ML syrup [Pharmacy Med Name: PROMETHAZINE  DM     SOL] 118 mL 0    Sig: TAKE 5 MLS BY MOUTH 4 TIMES DAILY AS NEEDED FOR UP TO 9 DAYS FOR COUGH     Ear, Nose, and Throat:  Antitussives/Expectorants Passed - 12/02/2024 11:54 AM      Passed - Valid encounter within last 12 months    Recent Outpatient Visits           3 days ago Acute bronchitis, unspecified organism   [facility] at [facility], Harlene, MD   1 month ago Type 2 diabetes mellitus with peripheral neuropathy [facility])   [place] [facility] [place], Marsa PARAS, DO   6 months ago Type 2 diabetes mellitus with peripheral neuropathy [facility])   [place] [facility] [place], Marsa PARAS, DO   10 months ago Type 2 diabetes mellitus with peripheral neuropathy [facility])    [facility] [place] Marsa PARAS, DO       Future Appointments             In 1 month Agbor-Etang, Redell, MD [facility] at [facility]

## 2024-12-06 ENCOUNTER — Other Ambulatory Visit: Payer: Self-pay | Admitting: Family Medicine

## 2024-12-06 ENCOUNTER — Ambulatory Visit (INDEPENDENT_AMBULATORY_CARE_PROVIDER_SITE_OTHER): Admitting: Student

## 2024-12-06 ENCOUNTER — Ambulatory Visit: Payer: Self-pay

## 2024-12-06 ENCOUNTER — Encounter: Payer: Self-pay | Admitting: Student

## 2024-12-06 VITALS — BP 138/78 | HR 81 | Temp 98.2°F | Ht 70.5 in | Wt 222.1 lb

## 2024-12-06 DIAGNOSIS — E1142 Type 2 diabetes mellitus with diabetic polyneuropathy: Secondary | ICD-10-CM

## 2024-12-06 DIAGNOSIS — J441 Chronic obstructive pulmonary disease with (acute) exacerbation: Secondary | ICD-10-CM | POA: Diagnosis not present

## 2024-12-06 MED ORDER — AMOXICILLIN-POT CLAVULANATE 875-125 MG PO TABS: 1.0000 | ORAL_TABLET | Freq: Two times a day (BID) | ORAL | 0 refills | Status: AC

## 2024-12-06 MED ORDER — PREDNISONE 10 MG PO TABS: ORAL_TABLET | ORAL | 0 refills | Status: AC

## 2024-12-06 NOTE — Progress Notes (Addendum)
 "  Established Patient Office Visit  Subjective   Patient ID: Henry Horne, male    DOB: 1951/04/16  Age: 74 y.o. MRN: [id number]  Chief Complaint  Patient presents with   Cough    Previously seen for cough, coughing not getting any better, coughing up rusty-colored (reddish-brown) sputum, requesting prednisone      Henry Horne is a 74 y.o. person with medical hx listed below who presents today for continued cough 2 weeks ago. Feels occasional wheezing at night, and producing brown sputum.   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   Obesity (BMI 30.0-34.9) 01/17/2018   Osteoarthritis of multiple joints 10/04/2017   Asthma 12/30/2016   ACE-inhibitor cough 12/30/2016   Agitation 06/16/2016   Type 2 diabetes mellitus with peripheral neuropathy (HCC) 12/25/2015   Hyperlipidemia associated with type 2 diabetes mellitus (HCC) 11/02/2015   Asymptomatic bacteriuria 10/08/2015   Essential hypertension 07/29/2015   Urinary obstruction, not elsewhere classified 06/19/2013   Personal history of prostate cancer 12/19/2012   Family history of malignant neoplasm of prostate 12/12/2012   ED (erectile dysfunction) of organic origin 12/12/2012   Incomplete bladder emptying 12/12/2012   Personal history of bladder cancer 12/12/2012   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   amoxicillin -clavulanate (AUGMENTIN ) 875-125 MG tablet Take 1 tablet by mouth 2 (two) times daily for 5  days.   aspirin  EC 81 MG tablet Take 81 mg by mouth daily. Swallow whole.   BREZTRI  AEROSPHERE 160-9-4.8 MCG/ACT AERO Inhale 2 puffs into the lungs 2 (two) times daily.   cetirizine (ZYRTEC) 10 MG tablet Take 10 mg by mouth daily.   diclofenac  sodium (VOLTAREN ) 1 % GEL Apply 2 g topically 3 (three) times daily as needed. For knees   ezetimibe  (ZETIA ) 10 MG tablet Take 1 tablet (10 mg total) by mouth daily.   FLUoxetine  (PROZAC ) 40 MG capsule Take 1 capsule (40 mg total) by mouth daily.   fluticasone  (FLONASE ) 50 MCG/ACT nasal spray Place into the nose. Place 2 sprays into one nostril once daily as needed   Garlic  Oil 2 MG CAPS Take 1,000 mg by mouth 2 (two) times daily. Reported on 11/17/2015   imipramine  (TOFRANIL ) 10 MG tablet Take 1 tablet (10 mg total) by mouth at bedtime.   Insulin  Pen Needle 32G X 6 MM MISC Use to inject insulin  into skin daily.   LANTUS  SOLOSTAR 100 UNIT/ML Solostar Pen INJECT 70 UNITS SUBCUTANEOUSLY ONCE DAILY   losartan  (COZAAR ) 100 MG tablet Take 1 tablet (100 mg total) by mouth daily.   metFORMIN  (GLUCOPHAGE ) 1000 MG tablet Take 1 tablet (1,000 mg total) by mouth 2 (two) times daily with a meal.   MOUNJARO  10 MG/0.5ML Pen Inject 10 mg into the skin once a week.   omeprazole  (PRILOSEC) 40 MG capsule Take 1 capsule (40 mg total) by mouth daily.   predniSONE  (DELTASONE ) 10 MG tablet Take  6 tablets (60 mg total) by mouth daily with breakfast for 1 day, THEN 5 tablets (50 mg total) daily with breakfast for 1 day, THEN 4 tablets (40 mg total) daily with breakfast for 1 day, THEN 3 tablets (30 mg total) daily with breakfast for 1 day, THEN 2 tablets (20 mg total) daily with breakfast for 1 day, THEN 1 tablet (10 mg total) daily with breakfast for 1 day.   promethazine -dextromethorphan (PROMETHAZINE -DM) 6.25-15 MG/5ML syrup Take 5 mLs by mouth 4 (four) times daily as needed for up to 9 days for cough.   traMADol  (ULTRAM ) 50 MG tablet Take 1 tablet (50 mg total) by mouth every 8  (eight) hours as needed.   No facility-administered encounter medications on file as of 12/06/2024.    BP 138/78   Pulse 81   Temp 98.2 F (36.8 C) (Oral)   Ht 5' 10.5 (1.791 m)   Wt 222 lb 2 oz (100.8 kg)   SpO2 96%   BMI 31.42 kg/m  BP Readings from Last 3 Encounters:  12/06/24 138/78  11/29/24 (!) 148/90  10/10/24 (!) 168/70    Physical Exam Constitutional:      Appearance: Normal appearance.  HENT:     Right Ear: Tympanic membrane normal.     Left Ear: Tympanic membrane normal.     Nose: Nose normal.     Mouth/Throat:     Mouth: Mucous membranes are moist.     Pharynx: Oropharynx is clear.  Eyes:     Extraocular Movements: Extraocular movements intact.     Pupils: Pupils are equal, round, and reactive to light.  Cardiovascular:     Rate and Rhythm: Normal rate and regular rhythm.  Pulmonary:     Effort: Pulmonary effort is normal.     Breath sounds: No wheezing (rare, expiratory) or rales.     Comments: Diminished breath sounds  Abdominal:     General: Abdomen is flat. Bowel sounds are normal. There is no distension.     Palpations: Abdomen is soft.     Tenderness: There is no abdominal tenderness.  Musculoskeletal:        General: Normal range of motion.     Right lower leg: No edema.     Left lower leg: No edema.  Skin:    General: Skin is warm and dry.     Capillary Refill: Capillary refill takes less than 2 seconds.  Neurological:     General: No focal deficit present.     Mental Status: He is alert and oriented to person, place, and time.  Psychiatric:        Mood and Affect: Mood normal.        Behavior: Behavior normal.        10/10/2024    8:03 AM 05/16/2024   10:57 AM 01/12/2024    1:26 PM  Depression screen PHQ 2/9  Decreased Interest 0 1 1  Down, Depressed, Hopeless 1 0   PHQ - 2 Score 1 1 1   Altered sleeping 0 2 1  Tired, decreased energy 1 3 1   Change in appetite 0  0  Feeling bad or failure about yourself  0 0 0  Trouble  concentrating 0  0  Moving slowly or fidgety/restless 0 3 0  Suicidal thoughts 0 0 0  PHQ-9 Score 2 9  3    Difficult doing work/chores Somewhat difficult Somewhat difficult Not difficult at all     Data saved with a previous flowsheet row definition  10/10/2024    8:03 AM 05/16/2024   10:58 AM 01/12/2024    1:26 PM 07/14/2022    9:54 AM  GAD 7 : Generalized Anxiety Score  Nervous, Anxious, on Edge 0 0 0 1  Control/stop worrying 0 1 0 0  Worry too much - different things 0 1 0 0  Trouble relaxing 0 0 0 0  Restless 0 0 0 1  Easily annoyed or irritable 0 1 1 1   Afraid - awful might happen 0 0 0 0  Total GAD 7 Score 0 3 1 3   Anxiety Difficulty Not difficult at all Somewhat difficult Not difficult at all Not difficult at all    No results found for any visits on 12/06/24.    The 10-year ASCVD risk score (Arnett DK, et al., 2019) is: 49.2%    Assessment & Plan:  COPD exacerbation (HCC) Likely due to acute bronchitis. Reports he is unable to tolerate macrolides. Will treat with prednisone  taper and Augmentin .  Other orders -     predniSONE ; Take 6 tablets (60 mg total) by mouth daily with breakfast for 1 day, THEN 5 tablets (50 mg total) daily with breakfast for 1 day, THEN 4 tablets (40 mg total) daily with breakfast for 1 day, THEN 3 tablets (30 mg total) daily with breakfast for 1 day, THEN 2 tablets (20 mg total) daily with breakfast for 1 day, THEN 1 tablet (10 mg total) daily with breakfast for 1 day.  Dispense: 21 tablet; Refill: 0 -     Amoxicillin -Pot Clavulanate; Take 1 tablet by mouth 2 (two) times daily for 5 days.  Dispense: 10 tablet; Refill: 0     No follow-ups on file.    Harlene Saddler, MD "

## 2024-12-06 NOTE — Telephone Encounter (Signed)
 Requested Prescriptions  Pending Prescriptions Disp Refills   LANTUS  SOLOSTAR 100 UNIT/ML Solostar Pen [Pharmacy Med Name: Lantus  SoloStar 100 UNIT/ML Subcutaneous Solution Pen-injector] 30 mL 0    Sig: INJECT 70 UNITS SUBCUTANEOUSLY ONCE DAILY     Endocrinology:  Diabetes - Insulins Passed - 12/06/2024  4:10 PM      Passed - HBA1C is between 0 and 7.9 and within 180 days    Hgb A1c MFr Bld  Date Value Ref Range Status  10/10/2024 6.1 (H) <5.7 % Final    Comment:    For someone without known diabetes, a hemoglobin  A1c value between 5.7% and 6.4% is consistent with prediabetes and should be confirmed with a  follow-up test. . For someone with known diabetes, a value <7% indicates that their diabetes is well controlled. A1c targets should be individualized based on duration of diabetes, age, comorbid conditions, and other considerations. . This assay result is consistent with an increased risk of diabetes. . Currently, no consensus exists regarding use of hemoglobin A1c for diagnosis of diabetes for children. SABRA Amy - Valid encounter within last 6 months    Recent Outpatient Visits           Today COPD exacerbation [facility])   [place] [facility] at [facility], MD   1 week ago Acute bronchitis, unspecified organism   [facility], Har[facility], MD   1 month ago Type 2 diabetes mellitus with peripheral neuropathy [facility])   [facility] [place], Marsa PARAS, DO   6 months ago Type 2 diabetes mellitus with peripheral neuropathy [facility])   [facility] [place] Marsa PARAS, DO   10 months ago Type 2 diabetes mellitus with peripheral neuropathy [facility])   [place] [facility] [place] Marsa PARAS, DO       Future Appointments             In 1 month Agbor-Etang, Redell, MD [facility] at  [facility]

## 2024-12-06 NOTE — Telephone Encounter (Signed)
 FYI Only or Action Required?: FYI only for provider: appointment scheduled on 12/06/24.  Patient was last seen in primary care on 11/29/2024 by Lemon Raisin, MD.  Called Nurse Triage reporting Cough.  Symptoms began about  10 days ago.  Interventions attempted: OTC medications: Coricidin and Prescription medications: promethazine -DM.  Symptoms are: gradually worsening.  Triage Disposition: See Physician Within 24 Hours  Patient/caregiver understands and will follow disposition?: Yes             Copied from CRM [id number]. Topic: Clinical - Red Word Triage >> Dec 06, 2024  8:52 AM Willma R wrote: Red Word that prompted transfer to Nurse Triage: Patient has had a cold for the last week and a half, was in on 01/02 but is not any better. Still has a bad cough with discolored mucous. Reason for Disposition  Coughing up rusty-colored (reddish-brown) sputum  Answer Assessment - Initial Assessment Questions Patient was seen on 11/29/24 at [facility] office and told viral bronchitis. Symptoms not improved. Taking promethazine -DM cough syrup and Coricidin.  1. ONSET: When did the cough begin?      About 10 days ago.  2. SEVERITY: How bad is the cough today?      Worse at night. No vomiting or wheezing/whooping.  3. SPUTUM: Describe the color of your sputum (e.g., none, dry cough; clear, white, yellow, green)     Grey and brown.  4. HEMOPTYSIS: Are you coughing up any blood? If Yes, ask: How much? (e.g., flecks, streaks, tablespoons, etc.)     No.  5. DIFFICULTY BREATHING: Are you having difficulty breathing? If Yes, ask: How bad is it? (e.g., mild, moderate, severe)      No.  6. FEVER: Do you have a fever? If Yes, ask: What is your temperature, how was it measured, and when did it start?     No.  7. CARDIAC HISTORY: Do you have any history of heart disease? (e.g., heart attack, congestive heart failure)      No.  8. LUNG HISTORY: Do you have any history of  lung disease?  (e.g., pulmonary embolus, asthma, emphysema)     Asthma; COPD.  9. PE RISK FACTORS: Do you have a history of blood clots? (or: recent major surgery, recent prolonged travel, bedridden)     No.  10. OTHER SYMPTOMS: Do you have any other symptoms? (e.g., runny nose, wheezing, chest pain)       No chest pain or additional symptoms.  11. PREGNANCY: Is there any chance you are pregnant? When was your last menstrual period?       N/A.  12. TRAVEL: Have you traveled out of the country in the last month? (e.g., travel history, exposures)       No travel. He states he works at [employer] so hard to say about exposures.  Protocols used: Cough - Acute Productive-A-AH

## 2024-12-08 ENCOUNTER — Other Ambulatory Visit: Payer: Self-pay | Admitting: Family Medicine

## 2024-12-08 DIAGNOSIS — E1142 Type 2 diabetes mellitus with diabetic polyneuropathy: Secondary | ICD-10-CM

## 2024-12-10 NOTE — Telephone Encounter (Signed)
 Discontinued 02/13/24, dose change.  Requested Prescriptions  Pending Prescriptions Disp Refills   MOUNJARO  7.5 MG/0.5ML Pen [Pharmacy Med Name: Mounjaro  7.5 MG/0.5ML Subcutaneous Solution Auto-injector] 4 mL 0    Sig: INJECT 7.5 MG SUBCUTANEOUSLY ONCE A WEEK     Off-Protocol Failed - 12/10/2024  9:12 AM      Failed - Medication not assigned to a protocol, review manually.      Passed - Valid encounter within last 12 months    Recent Outpatient Visits           4 days ago COPD exacerbation [facility])   [facility], MD   1 week ago Acute bronchitis, unspecified organism   [facility] at [facility], Harlene, MD   2 months ago Type 2 diabetes mellitus with peripheral neuropathy [facility])   [facility] Duran, Marsa PARAS, DO   6 months ago Type 2 diabetes mellitus with peripheral neuropathy [facility])   [facility] Marsa PARAS, DO   11 months ago Type 2 diabetes mellitus with peripheral neuropathy [facility])    [facility] Marsa PARAS, DO       Future Appointments             In 1 month Agbor-Etang, Redell, MD [facility]

## 2025-01-10 ENCOUNTER — Ambulatory Visit: Payer: Self-pay | Admitting: Cardiology

## 2025-04-10 ENCOUNTER — Ambulatory Visit: Admitting: Family Medicine
